# Patient Record
Sex: Male | Born: 1980 | Race: White | Hispanic: No | Smoking: Current every day smoker
Health system: Southern US, Community
[De-identification: ages and names within clinical notes are randomized; demographics above are authoritative.]

## PROBLEM LIST (undated history)

## (undated) DIAGNOSIS — J45909 Unspecified asthma, uncomplicated: Secondary | ICD-10-CM

## (undated) DIAGNOSIS — F419 Anxiety disorder, unspecified: Secondary | ICD-10-CM

## (undated) DIAGNOSIS — F1120 Opioid dependence, uncomplicated: Secondary | ICD-10-CM

## (undated) HISTORY — PX: ANKLE SURGERY: SHX546

---

## 1999-01-05 ENCOUNTER — Emergency Department (HOSPITAL_COMMUNITY): Admission: EM | Admit: 1999-01-05 | Discharge: 1999-01-06 | Payer: Self-pay | Admitting: Endocrinology

## 1999-01-05 ENCOUNTER — Encounter: Payer: Self-pay | Admitting: Endocrinology

## 1999-05-29 ENCOUNTER — Inpatient Hospital Stay (HOSPITAL_COMMUNITY): Admission: EM | Admit: 1999-05-29 | Discharge: 1999-05-30 | Payer: Self-pay | Admitting: Emergency Medicine

## 2001-06-20 ENCOUNTER — Emergency Department (HOSPITAL_COMMUNITY): Admission: EM | Admit: 2001-06-20 | Discharge: 2001-06-20 | Payer: Self-pay | Admitting: Emergency Medicine

## 2001-06-20 ENCOUNTER — Encounter: Payer: Self-pay | Admitting: Emergency Medicine

## 2002-09-21 ENCOUNTER — Emergency Department (HOSPITAL_COMMUNITY): Admission: EM | Admit: 2002-09-21 | Discharge: 2002-09-21 | Payer: Self-pay | Admitting: Emergency Medicine

## 2002-09-21 ENCOUNTER — Encounter: Payer: Self-pay | Admitting: Emergency Medicine

## 2003-08-17 ENCOUNTER — Emergency Department (HOSPITAL_COMMUNITY): Admission: EM | Admit: 2003-08-17 | Discharge: 2003-08-17 | Payer: Self-pay | Admitting: Emergency Medicine

## 2004-02-16 ENCOUNTER — Emergency Department (HOSPITAL_COMMUNITY): Admission: EM | Admit: 2004-02-16 | Discharge: 2004-02-16 | Payer: Self-pay | Admitting: Emergency Medicine

## 2004-02-24 ENCOUNTER — Emergency Department (HOSPITAL_COMMUNITY): Admission: EM | Admit: 2004-02-24 | Discharge: 2004-02-24 | Payer: Self-pay | Admitting: Emergency Medicine

## 2005-03-17 ENCOUNTER — Emergency Department (HOSPITAL_COMMUNITY): Admission: EM | Admit: 2005-03-17 | Discharge: 2005-03-17 | Payer: Self-pay | Admitting: Emergency Medicine

## 2005-07-06 ENCOUNTER — Emergency Department (HOSPITAL_COMMUNITY): Admission: EM | Admit: 2005-07-06 | Discharge: 2005-07-06 | Payer: Self-pay | Admitting: Emergency Medicine

## 2005-10-23 ENCOUNTER — Emergency Department (HOSPITAL_COMMUNITY): Admission: EM | Admit: 2005-10-23 | Discharge: 2005-10-23 | Payer: Self-pay | Admitting: Emergency Medicine

## 2008-10-21 ENCOUNTER — Emergency Department (HOSPITAL_COMMUNITY): Admission: EM | Admit: 2008-10-21 | Discharge: 2008-10-21 | Payer: Self-pay | Admitting: Emergency Medicine

## 2010-12-17 LAB — CBC
HCT: 45 % (ref 39.0–52.0)
Hemoglobin: 15.3 g/dL (ref 13.0–17.0)
MCHC: 34 g/dL (ref 30.0–36.0)
MCV: 95.7 fL (ref 78.0–100.0)
Platelets: 272 10*3/uL (ref 150–400)
RBC: 4.7 MIL/uL (ref 4.22–5.81)
RDW: 12.6 % (ref 11.5–15.5)
WBC: 9 10*3/uL (ref 4.0–10.5)

## 2010-12-17 LAB — POCT I-STAT, CHEM 8
BUN: 15 mg/dL (ref 6–23)
Calcium, Ion: 1.15 mmol/L (ref 1.12–1.32)
Chloride: 102 mEq/L (ref 96–112)
Creatinine, Ser: 1.1 mg/dL (ref 0.4–1.5)
Glucose, Bld: 122 mg/dL — ABNORMAL HIGH (ref 70–99)
HCT: 47 % (ref 39.0–52.0)
Hemoglobin: 16 g/dL (ref 13.0–17.0)
Potassium: 4 mEq/L (ref 3.5–5.1)
Sodium: 140 mEq/L (ref 135–145)
TCO2: 27 mmol/L (ref 0–100)

## 2010-12-17 LAB — DIFFERENTIAL
Basophils Absolute: 0.1 10*3/uL (ref 0.0–0.1)
Basophils Relative: 1 % (ref 0–1)
Eosinophils Absolute: 0.7 10*3/uL (ref 0.0–0.7)
Eosinophils Relative: 8 % — ABNORMAL HIGH (ref 0–5)
Lymphocytes Relative: 29 % (ref 12–46)
Lymphs Abs: 2.6 10*3/uL (ref 0.7–4.0)
Monocytes Absolute: 0.8 10*3/uL (ref 0.1–1.0)
Monocytes Relative: 9 % (ref 3–12)
Neutro Abs: 4.9 10*3/uL (ref 1.7–7.7)
Neutrophils Relative %: 54 % (ref 43–77)

## 2010-12-17 LAB — HEMOCCULT GUIAC POC 1CARD (OFFICE): Fecal Occult Bld: NEGATIVE

## 2011-02-05 DIAGNOSIS — Z720 Tobacco use: Secondary | ICD-10-CM | POA: Diagnosis present

## 2012-03-09 ENCOUNTER — Emergency Department (HOSPITAL_COMMUNITY)
Admission: EM | Admit: 2012-03-09 | Discharge: 2012-03-09 | Disposition: A | Discharge status: Home / Self Care | Payer: Self-pay | Attending: Emergency Medicine | Admitting: Emergency Medicine

## 2012-03-09 ENCOUNTER — Encounter (HOSPITAL_COMMUNITY): Payer: Self-pay | Admitting: Emergency Medicine

## 2012-03-09 DIAGNOSIS — R062 Wheezing: Secondary | ICD-10-CM | POA: Insufficient documentation

## 2012-03-09 DIAGNOSIS — J9801 Acute bronchospasm: Secondary | ICD-10-CM | POA: Insufficient documentation

## 2012-03-09 DIAGNOSIS — F411 Generalized anxiety disorder: Secondary | ICD-10-CM | POA: Insufficient documentation

## 2012-03-09 DIAGNOSIS — R0602 Shortness of breath: Secondary | ICD-10-CM | POA: Insufficient documentation

## 2012-03-09 HISTORY — DX: Anxiety disorder, unspecified: F41.9

## 2012-03-09 HISTORY — DX: Unspecified asthma, uncomplicated: J45.909

## 2012-03-09 MED ORDER — LORAZEPAM 1 MG PO TABS
1.0000 mg | ORAL_TABLET | Freq: Once | ORAL | Status: AC
  Administered 2012-03-09: 1 mg via ORAL
  Filled 2012-03-09: qty 1

## 2012-03-09 MED ORDER — IPRATROPIUM BROMIDE 0.02 % IN SOLN
0.5000 mg | RESPIRATORY_TRACT | Status: AC
  Administered 2012-03-09: 0.5 mg via RESPIRATORY_TRACT
  Filled 2012-03-09: qty 2.5

## 2012-03-09 MED ORDER — ALBUTEROL SULFATE HFA 108 (90 BASE) MCG/ACT IN AERS
2.0000 | INHALATION_SPRAY | Freq: Four times a day (QID) | RESPIRATORY_TRACT | Status: DC
  Administered 2012-03-09: 2 via RESPIRATORY_TRACT
  Filled 2012-03-09: qty 6.7

## 2012-03-09 MED ORDER — ALBUTEROL SULFATE (5 MG/ML) 0.5% IN NEBU
5.0000 mg | INHALATION_SOLUTION | Freq: Once | RESPIRATORY_TRACT | Status: AC
  Administered 2012-03-09: 5 mg via RESPIRATORY_TRACT
  Filled 2012-03-09: qty 40

## 2012-03-09 NOTE — ED Provider Notes (Signed)
Medical screening examination/treatment/procedure(s) were performed by non-physician practitioner and as supervising physician I was immediately available for consultation/collaboration.  Olivia Mackie, MD 03/09/12 [phone]

## 2012-03-09 NOTE — ED Notes (Addendum)
PT presents tearful; reports he feels like he cannot breath, scared, heavy smoker. Lung sounds clear, says fingers are tingly.

## 2012-03-09 NOTE — ED Provider Notes (Signed)
History     CSN: [id number]  Arrival date & time 03/09/12  0610   First MD Initiated Contact with Patient 03/09/12 [phone]      Chief Complaint  Patient presents with  . Shortness of Breath    (Consider location/radiation/quality/duration/timing/severity/associated sxs/prior treatment) HPI Comments: Patient presents with shortness of breath which was sudden onset and awoke him from sleep. It was severe in nature. He had accompanying chest tightness but denies overt chest pain. Also has some tingling to his fingertips. Denies palpitations, diaphoresis, nausea/vomiting. 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 He is a smoker. States this episode today he does feel somewhat similar to his asthma attacks as a child. He also has a history of panic attacks and felt as if he began to panic some secondary to the shortness of breath. He denies any leg swelling, cough, hemoptysis, or recent travel, hospitalizations, or prolonged immobilization. He does not currently have albuterol at home to use.  Patient is a 31 y.o. male presenting with shortness of breath. The history is provided by the patient.  Shortness of Breath  The current episode started today. The onset was sudden. The problem occurs continuously. The problem has been gradually improving. The problem is severe. Nothing relieves the symptoms. Associated symptoms include shortness of breath and wheezing. Pertinent negatives include no chest pain, no chest pressure, no orthopnea, no fever, no rhinorrhea, no sore throat, no stridor and no cough. He has had no prior steroid use. His past medical history is significant for asthma. There were no sick contacts. He has received no recent medical care.    Past Medical History  Diagnosis Date  . Asthma   . Anxiety     No past surgical history on file.  No family history on file.  History  Substance Use Topics  . Smoking status:  Not on file  . Smokeless tobacco: Not on file  . Alcohol Use:       Review of Systems  Constitutional: Negative for fever, chills and diaphoresis.  HENT: Negative for sore throat and rhinorrhea.   Respiratory: Positive for shortness of breath and wheezing. Negative for cough and stridor.   Cardiovascular: Negative for chest pain, palpitations, orthopnea and leg swelling.  Gastrointestinal: Negative for nausea, vomiting and abdominal pain.  Neurological: Negative for dizziness.  Psychiatric/Behavioral: The patient is nervous/anxious.   All other systems reviewed and are negative.    Allergies  Review of patient's allergies indicates no known allergies.  Home Medications   Current Outpatient Rx  Name Route Sig Dispense Refill  . TRAMADOL HCL 50 MG PO TABS Oral Take 50 mg by mouth every 6 (six) hours as needed. For pain      BP 137/77  Pulse 82  Temp 98 F (36.7 C) (Oral)  Resp 18  SpO2 99%  Physical Exam  Nursing note and vitals reviewed. Constitutional: He is oriented to person, place, and time. He appears well-developed and well-nourished. No distress.       Anxious appearing, tearful  HENT:  Head: Normocephalic and atraumatic.  Eyes:       Normal appearance  Neck: Normal range of motion.  Cardiovascular: Normal rate, regular rhythm and normal heart sounds.   Pulmonary/Chest: Effort normal.       Mild wheezing throughout  Abdominal: Soft. Bowel sounds are normal. There is no tenderness. There is no rebound and no guarding.  Musculoskeletal: Normal range  of motion.  Neurological: He is alert and oriented to person, place, and time.  Skin: Skin is warm and dry. He is not diaphoretic.  Psychiatric: He has a normal mood and affect.    ED Course  Procedures (including critical care time)  Labs Reviewed - No data to display No results found.   1. Bronchospasm       MDM  Pt with acute dyspnea this evening. States feels somewhat similar to asthma as a  child. He has slight wheezing on exam. Suspect this represents likely bronchospasm with superimposed anxiety. Satting 100% on RA throughout his stay. Given neb tx and low dose ativan and had resolution of his sx. Pt given albuterol inhaler to use at home should this recur. Reasons to return to ED discussed. Pt agreeable.        Grant Fontana, [place] 03/09/12 [phone]

## 2012-03-09 NOTE — ED Notes (Addendum)
Pt stated he woke up out of his sleep "...feeling like I was gonna die.  I couldn't catch my breath.  It felt like I was having an asthma attack."  Pt reports his chest felt tight and face and hands were tingling.    Pt reports having "panic attacks" in the past - and this felt somewhat similar.    Placed pt in position of comfort - which for him was sitting with HOB elevated.  Dimmed lights and encouraged pt to take slow deep breaths.  Pt states the sob has diminished since arrival to the ED.  Pt maintains 100% oxygen saturation on RA.  Family is at the bedside - will continue to monitor.

## 2012-04-20 ENCOUNTER — Other Ambulatory Visit (HOSPITAL_COMMUNITY): Payer: Self-pay | Admitting: Orthopedic Surgery

## 2012-04-20 DIAGNOSIS — M25511 Pain in right shoulder: Secondary | ICD-10-CM

## 2012-04-22 ENCOUNTER — Inpatient Hospital Stay (HOSPITAL_COMMUNITY)
Admission: RE | Admit: 2012-04-22 | Discharge: 2012-04-22 | Payer: Self-pay | Source: Ambulatory Visit | Attending: Orthopedic Surgery | Admitting: Orthopedic Surgery

## 2012-04-22 ENCOUNTER — Other Ambulatory Visit (HOSPITAL_COMMUNITY): Payer: Self-pay

## 2013-09-01 HISTORY — PX: WRIST SURGERY: SHX841

## 2014-07-31 ENCOUNTER — Emergency Department (HOSPITAL_BASED_OUTPATIENT_CLINIC_OR_DEPARTMENT_OTHER)
Admission: EM | Admit: 2014-07-31 | Discharge: 2014-07-31 | Disposition: A | Discharge status: Home / Self Care | Payer: PRIVATE HEALTH INSURANCE | Attending: Emergency Medicine | Admitting: Emergency Medicine

## 2014-07-31 ENCOUNTER — Encounter (HOSPITAL_BASED_OUTPATIENT_CLINIC_OR_DEPARTMENT_OTHER): Payer: Self-pay | Admitting: *Deleted

## 2014-07-31 DIAGNOSIS — Z8659 Personal history of other mental and behavioral disorders: Secondary | ICD-10-CM | POA: Insufficient documentation

## 2014-07-31 DIAGNOSIS — J45909 Unspecified asthma, uncomplicated: Secondary | ICD-10-CM | POA: Insufficient documentation

## 2014-07-31 DIAGNOSIS — K5289 Other specified noninfective gastroenteritis and colitis: Secondary | ICD-10-CM | POA: Insufficient documentation

## 2014-07-31 DIAGNOSIS — K529 Noninfective gastroenteritis and colitis, unspecified: Secondary | ICD-10-CM

## 2014-07-31 MED ORDER — ONDANSETRON 4 MG PO TBDP
ORAL_TABLET | ORAL | Status: AC
  Filled 2014-07-31: qty 1

## 2014-07-31 MED ORDER — ONDANSETRON 4 MG PO TBDP
4.0000 mg | ORAL_TABLET | Freq: Once | ORAL | Status: AC
  Administered 2014-07-31: 4 mg via ORAL

## 2014-07-31 MED ORDER — ONDANSETRON 4 MG PO TBDP: ORAL_TABLET | ORAL | Status: DC

## 2014-07-31 NOTE — ED Notes (Signed)
Pt reports N/V/D x 1 day.

## 2014-07-31 NOTE — ED Provider Notes (Signed)
CSN: [id number]     Arrival date & time 07/31/14  1551 History  This chart was scribed for Kenneth MoMatthew Gentry, MD by Gwenyth Oberatherine Macek, ED Scribe. This patient was seen in room MH01/MH01 and the patient's care was started at 5:07 PM.    Chief Complaint  Patient presents with  . Emesis   The history is provided by the patient. No language interpreter was used.    HPI Comments: Kenneth Charles is a 33 y.o. male who presents to the Emergency Department complaining of moderate nausea, vomiting and diarrhea that started 1 day ago. Pt has history of asthma, but no other medication conditions. He denies fever and abdominal pain as associated symptoms.  Timing is intermittent.  Nothing exacerbates or alleviates his symptoms. He has no pertinent past medical history.  He is tolerant of by mouth intake, states he attempted to make an appointment with his PCP but was unable to ventilator in the week..  Past Medical History  Diagnosis Date  . Asthma   . Anxiety    History reviewed. No pertinent past surgical history. History reviewed. No pertinent family history. History  Substance Use Topics  . Smoking status: Not on file  . Smokeless tobacco: Not on file  . Alcohol Use: Not on file    Review of Systems  Constitutional: Negative for fever.  Gastrointestinal: Positive for nausea, vomiting and diarrhea. Negative for abdominal pain.  All other systems reviewed and are negative.  Allergies  Review of patient's allergies indicates no known allergies.  Home Medications   Prior to Admission medications   Medication Sig Start Date End Date Taking? Authorizing Provider  ondansetron (ZOFRAN ODT) 4 MG disintegrating tablet 4mg  ODT q4 hours prn nausea/vomit 07/31/14   Kenneth MoMatthew Gentry, MD   BP 127/79 mmHg  Pulse 80  Temp(Src) 98.6 F (37 C) (Oral)  Resp 18  Ht 6\' 2"  (1.88 m)  Wt 175 lb (79.379 kg)  BMI 22.46 kg/m2  SpO2 99% Physical Exam  Constitutional: He is oriented to person, place, and  time. He appears well-developed and well-nourished.  HENT:  Head: Normocephalic and atraumatic.  Eyes: Conjunctivae and EOM are normal.  Neck: Normal range of motion. Neck supple.  Cardiovascular: Normal rate, regular rhythm and normal heart sounds.   Pulmonary/Chest: Effort normal and breath sounds normal. No respiratory distress.  Abdominal: He exhibits no distension. There is no tenderness. There is no rebound and no guarding.  Musculoskeletal: Normal range of motion.  Neurological: He is alert and oriented to person, place, and time.  Skin: Skin is warm and dry.  Vitals reviewed.   ED Course  Procedures (including critical care time) DIAGNOSTIC STUDIES: Oxygen Saturation is 99% on RA, normal by my interpretation.    COORDINATION OF CARE: 5:08 PM Discussed treatment plan with pt which includes Zofran. Pt agreed to plan.  Labs Review Labs Reviewed - No data to display  Imaging Review No results found.   EKG Interpretation None      MDM   Final diagnoses:  Gastroenteritis    33 y.o. male with pertinent PMH of asthma presents with signs and symptoms consistent with viral gastroenteritis 2 days. No fevers. Patient is tired appearing take. On arrival vital signs and physical exam as above, completely benign. Likely viral gastroenteritis. Discussed possibility of other occult pathology. Patient given Zofran. Discharged in stable condition with standard return precautions..    1. Gastroenteritis           Kenneth MoMatthew Gentry, MD 07/31/14  1715 

## 2014-07-31 NOTE — Discharge Instructions (Signed)

## 2014-08-04 ENCOUNTER — Encounter (HOSPITAL_BASED_OUTPATIENT_CLINIC_OR_DEPARTMENT_OTHER): Payer: Self-pay

## 2014-08-04 ENCOUNTER — Emergency Department (HOSPITAL_BASED_OUTPATIENT_CLINIC_OR_DEPARTMENT_OTHER)
Admission: EM | Admit: 2014-08-04 | Discharge: 2014-08-04 | Disposition: A | Discharge status: Home / Self Care | Payer: PRIVATE HEALTH INSURANCE | Attending: Emergency Medicine | Admitting: Emergency Medicine

## 2014-08-04 DIAGNOSIS — J45909 Unspecified asthma, uncomplicated: Secondary | ICD-10-CM | POA: Insufficient documentation

## 2014-08-04 DIAGNOSIS — K529 Noninfective gastroenteritis and colitis, unspecified: Secondary | ICD-10-CM | POA: Insufficient documentation

## 2014-08-04 DIAGNOSIS — Z720 Tobacco use: Secondary | ICD-10-CM | POA: Insufficient documentation

## 2014-08-04 DIAGNOSIS — R197 Diarrhea, unspecified: Secondary | ICD-10-CM

## 2014-08-04 DIAGNOSIS — Z8659 Personal history of other mental and behavioral disorders: Secondary | ICD-10-CM | POA: Insufficient documentation

## 2014-08-04 LAB — URINALYSIS, ROUTINE W REFLEX MICROSCOPIC
Glucose, UA: NEGATIVE mg/dL
Hgb urine dipstick: NEGATIVE
Ketones, ur: 15 mg/dL — AB
Leukocytes, UA: NEGATIVE
Nitrite: NEGATIVE
Protein, ur: NEGATIVE mg/dL
Specific Gravity, Urine: 1.038 — ABNORMAL HIGH (ref 1.005–1.030)
Urobilinogen, UA: 1 mg/dL (ref 0.0–1.0)
pH: 6 (ref 5.0–8.0)

## 2014-08-04 LAB — COMPREHENSIVE METABOLIC PANEL
ALBUMIN: 3.8 g/dL (ref 3.5–5.2)
ALT: 30 U/L (ref 0–53)
ANION GAP: 12 (ref 5–15)
AST: 23 U/L (ref 0–37)
Alkaline Phosphatase: 90 U/L (ref 39–117)
BILIRUBIN TOTAL: 0.4 mg/dL (ref 0.3–1.2)
BUN: 14 mg/dL (ref 6–23)
CHLORIDE: 102 meq/L (ref 96–112)
CO2: 26 mEq/L (ref 19–32)
CREATININE: 0.9 mg/dL (ref 0.50–1.35)
Calcium: 9.3 mg/dL (ref 8.4–10.5)
GFR calc Af Amer: >90 mL/min (ref >90)
GFR calc non Af Amer: >90 mL/min (ref >90)
Glucose, Bld: 93 mg/dL (ref 70–99)
Potassium: 3.6 mEq/L — ABNORMAL LOW (ref 3.7–5.3)
Sodium: 140 mEq/L (ref 137–147)
TOTAL PROTEIN: 7.5 g/dL (ref 6.0–8.3)

## 2014-08-04 LAB — CBC WITH DIFFERENTIAL/PLATELET
Basophils Absolute: 0.2 10*3/uL — ABNORMAL HIGH (ref 0.0–0.1)
Basophils Relative: 2 % — ABNORMAL HIGH (ref 0–1)
EOS ABS: 0.4 10*3/uL (ref 0.0–0.7)
EOS PCT: 4 % (ref 0–5)
HCT: 42 % (ref 39.0–52.0)
HEMOGLOBIN: 14 g/dL (ref 13.0–17.0)
Lymphocytes Relative: 22 % (ref 12–46)
Lymphs Abs: 2.2 10*3/uL (ref 0.7–4.0)
MCH: 29.8 pg (ref 26.0–34.0)
MCHC: 33.3 g/dL (ref 30.0–36.0)
MCV: 89.4 fL (ref 78.0–100.0)
MONO ABS: 1.2 10*3/uL — AB (ref 0.1–1.0)
Monocytes Relative: 12 % (ref 3–12)
NEUTROS PCT: 60 % (ref 43–77)
Neutro Abs: 6 10*3/uL (ref 1.7–7.7)
PLATELETS: 322 10*3/uL (ref 150–400)
RBC: 4.7 MIL/uL (ref 4.22–5.81)
RDW: 12.5 % (ref 11.5–15.5)
WBC: 10 10*3/uL (ref 4.0–10.5)

## 2014-08-04 LAB — OCCULT BLOOD X 1 CARD TO LAB, STOOL: FECAL OCCULT BLD: NEGATIVE

## 2014-08-04 LAB — LIPASE, BLOOD: LIPASE: 15 U/L (ref 11–59)

## 2014-08-04 MED ORDER — DICYCLOMINE HCL 10 MG PO CAPS
10.0000 mg | ORAL_CAPSULE | Freq: Once | ORAL | Status: AC
  Administered 2014-08-04: 10 mg via ORAL

## 2014-08-04 MED ORDER — DIPHENOXYLATE-ATROPINE 2.5-0.025 MG PO TABS: 2.0000 | ORAL_TABLET | Freq: Four times a day (QID) | ORAL | Status: DC | PRN

## 2014-08-04 MED ORDER — SODIUM CHLORIDE 0.9 % IV BOLUS (SEPSIS)
1000.0000 mL | Freq: Once | INTRAVENOUS | Status: AC
  Administered 2014-08-04: 1000 mL via INTRAVENOUS

## 2014-08-04 MED ORDER — DICYCLOMINE HCL 20 MG PO TABS: 20.0000 mg | ORAL_TABLET | Freq: Two times a day (BID) | ORAL | Status: DC

## 2014-08-04 MED ORDER — DIPHENOXYLATE-ATROPINE 2.5-0.025 MG PO TABS
2.0000 | ORAL_TABLET | Freq: Once | ORAL | Status: AC
  Administered 2014-08-04: 2 via ORAL

## 2014-08-04 NOTE — ED Provider Notes (Signed)
CSN: [id number]     Arrival date & time 08/04/14  1436 History   First MD Initiated Contact with Patient 08/04/14 1555     Chief Complaint  Patient presents with  . Abdominal Pain     (Consider location/radiation/quality/duration/timing/severity/associated sxs/prior Treatment) HPI Comments: Patient presents with 8 days of profuse watery diarrhea, generalized abdominal pain, nausea, vomiting. Patient began having symptoms and was seen in the emergency department 1-2 days after onset was diagnosed with gastroenteritis. Patient attempted the Zofran and Imodium at home without any relief. Patient states that he vomits and has watery diarrhea after eating or drinking anything. Otherwise, patient has diarrhea every 45 minutes to 1 hour. No recent travel. No recent camping or ingestion of water outdoors. No heavy NSAID use. No recent antibiotic use. Patient denies alcohol use. He has noted some streaks of red blood in his stool. Vomiting is nonbloody, nonbilious. Patient had a previous normal colonoscopy several years ago after having blood in stool.  Patient is a 33 y.o. male presenting with abdominal pain. The history is provided by the patient.  Abdominal Pain Associated symptoms: diarrhea, nausea and vomiting   Associated symptoms: no chest pain, no constipation, no cough, no dysuria, no fever, no shortness of breath and no sore throat     Past Medical History  Diagnosis Date  . Asthma   . Anxiety    History reviewed. No pertinent past surgical history. No family history on file. History  Substance Use Topics  . Smoking status: Current Every Day Smoker -- 0.50 packs/day    Types: Cigarettes  . Smokeless tobacco: Not on file  . Alcohol Use: No    Review of Systems  Constitutional: Negative for fever.  HENT: Negative for rhinorrhea and sore throat.   Eyes: Negative for redness.  Respiratory: Negative for cough and shortness of breath.   Cardiovascular: Negative for chest pain.   Gastrointestinal: Positive for nausea, vomiting, abdominal pain, diarrhea and blood in stool. Negative for constipation.  Genitourinary: Negative for dysuria.  Musculoskeletal: Negative for myalgias.  Skin: Negative for rash.  Neurological: Negative for headaches.    Allergies  Review of patient's allergies indicates no known allergies.  Home Medications   Prior to Admission medications   Medication Sig Start Date End Date Taking? Authorizing Provider  ondansetron (ZOFRAN ODT) 4 MG disintegrating tablet 4mg  ODT q4 hours prn nausea/vomit 07/31/14   Mirian MoMatthew Gentry, MD   BP 139/87 mmHg  Pulse 96  Temp(Src) 99 F (37.2 C) (Oral)  Resp 18  Ht 6\' 2"  (1.88 m)  Wt 175 lb (79.379 kg)  BMI 22.46 kg/m2  SpO2 96%   Physical Exam  Constitutional: He appears well-developed and well-nourished.  HENT:  Head: Normocephalic and atraumatic.  Mouth/Throat: Oropharynx is clear and moist.  Eyes: Conjunctivae are normal. Right eye exhibits no discharge. Left eye exhibits no discharge.  Neck: Normal range of motion. Neck supple. No JVD present.  Cardiovascular: Normal rate, regular rhythm and normal heart sounds.   No murmur heard. Pulmonary/Chest: Effort normal and breath sounds normal. No respiratory distress. He has no wheezes. He has no rales.  Abdominal: Soft. Bowel sounds are increased. There is tenderness (mild, generalized). There is no rebound and no guarding.  Musculoskeletal: He exhibits no edema or tenderness.  Neurological: He is alert.  Skin: Skin is warm and dry.  Psychiatric: He has a normal mood and affect.  Nursing note and vitals reviewed.   ED Course  Procedures (including critical care time) Labs  Review Labs Reviewed  URINALYSIS, ROUTINE W REFLEX MICROSCOPIC - Abnormal; Notable for the following:    Color, Urine AMBER (*)    APPearance CLOUDY (*)    Specific Gravity, Urine 1.038 (*)    Bilirubin Urine SMALL (*)    Ketones, ur 15 (*)    All other components within  normal limits  CBC WITH DIFFERENTIAL - Abnormal; Notable for the following:    Basophils Relative 2 (*)    Monocytes Absolute 1.2 (*)    Basophils Absolute 0.2 (*)    All other components within normal limits  COMPREHENSIVE METABOLIC PANEL - Abnormal; Notable for the following:    Potassium 3.6 (*)    All other components within normal limits  OCCULT BLOOD X 1 CARD TO LAB, STOOL  LIPASE, BLOOD  CBC WITH DIFFERENTIAL  GI PATHOGEN PANEL BY PCR, STOOL    Imaging Review No results found.   EKG Interpretation None      4:37 PM Patient seen and examined. Work-up initiated. Medications ordered.   Vital signs reviewed and are as follows: BP 139/87 mmHg  Pulse 96  Temp(Src) 99 F (37.2 C) (Oral)  Resp 18  Ht 6\' 2"  (1.88 m)  Wt 175 lb (79.379 kg)  BMI 22.46 kg/m2  SpO2 96%  7:14 PM Patient informed of results. Discussed with Dr. Deretha EmoryZackowski. Labs are reassuring. Patient has provided a stool sample and a GI pathogen panel has been sent.  Patient is feeling somewhat better after fluids. He continues to have diarrhea in emergency department. He has been treated with 2 L of normal saline. No vomiting.  We discussed risks and benefits of empiric antibiotics. Patient elects to forego antibiotics until we know the results of the GI pathogen panel. In the meantime, will treat with Lomotil and Bentyl.  Patient told to return to the emergency department with uncontrolled vomiting, if he develops fever, worsening or localizing abdominal pain, increasing amount of blood in stool. Patient verbalizes understanding and agrees with plan.    MDM   Final diagnoses:  Diarrhea  Colitis   Patients with profuse diarrhea for 8 days, bloody at times. Nonlocalized generalized abdominal pain. No fever. Patient has an element of dehydration in emergency department treated with IV fluids. Labs are overall reassuring. GI pathogen panel obtained and is currently pending. Will attempt to control symptoms  with Lomotil and Bentyl. Patient opts to defer antibiotics until definitive diagnosis is made. Patient appears well, nontoxic. We discussed return instructions and patient seems reliable to return if worsening. Do not feel that patient would benefit from hospitalization at this time.  No dangerous or life-threatening conditions suspected or identified by history, physical exam, and by work-up. No indications for hospitalization identified.      Renne CriglerJoshua Venson Ferencz, PA-C 08/04/14 2151  Vanetta MuldersScott Zackowski, MD 08/04/14 2330

## 2014-08-04 NOTE — Discharge Instructions (Signed)
Please read and follow all provided instructions.  Your diagnoses today include:  1. Diarrhea   2. Colitis     Tests performed today include:  Blood counts and electrolytes  Blood tests to check liver and kidney function  Blood tests to check pancreas function  Urine test to look for infection - shows dehydration  Stool test for different pathogens - pending  Stool test for blood - negative here tonight  Vital signs. See below for your results today.   Medications prescribed:   Lomotil - medication for diarrhea   Bentyl - medication for intestinal spasms  Take any prescribed medications only as directed.  Home care instructions:   Follow any educational materials contained in this packet.  Follow-up instructions: Please follow-up with your primary care provider in the next 2 days for further evaluation of your symptoms.    Return instructions:  SEEK IMMEDIATE MEDICAL ATTENTION IF:  The pain does not go away or becomes severe   A temperature above 101F develops   Repeated vomiting occurs (multiple episodes)   The pain becomes localized to portions of the abdomen. The right side could possibly be appendicitis. In an adult, the left lower portion of the abdomen could be colitis or diverticulitis.   Blood is being passed in stools or vomit (bright red or black tarry stools)   You develop chest pain, difficulty breathing, dizziness or fainting, or become confused, poorly responsive, or inconsolable (young children)  If you have any other emergent concerns regarding your health  Additional Information: Abdominal (belly) pain can be caused by many things. Your caregiver performed an examination and possibly ordered blood/urine tests and imaging (CT scan, x-rays, ultrasound). Many cases can be observed and treated at home after initial evaluation in the emergency department. Even though you are being discharged home, abdominal pain can be unpredictable. Therefore, you  need a repeated exam if your pain does not resolve, returns, or worsens. Most patients with abdominal pain don't have to be admitted to the hospital or have surgery, but serious problems like appendicitis and gallbladder attacks can start out as nonspecific pain. Many abdominal conditions cannot be diagnosed in one visit, so follow-up evaluations are very important.  Your vital signs today were: BP 139/87 mmHg   Pulse 96   Temp(Src) 99 F (37.2 C) (Oral)   Resp 18   Ht 6\' 2"  (1.88 m)   Wt 175 lb (79.379 kg)   BMI 22.46 kg/m2   SpO2 96% If your blood pressure (bp) was elevated above 135/85 this visit, please have this repeated by your doctor within one month. --------------

## 2014-08-04 NOTE — ED Notes (Signed)
Pt reports seen here recently for same, vomiting diarrhea and abd pain.

## 2014-08-07 LAB — GI PATHOGEN PANEL BY PCR, STOOL
C difficile toxin A/B: NEGATIVE
CAMPYLOBACTER BY PCR: NEGATIVE
CRYPTOSPORIDIUM BY PCR: NEGATIVE
E COLI (ETEC) LT/ST: NEGATIVE
E coli (STEC): NEGATIVE
E coli 0157 by PCR: NEGATIVE
G lamblia by PCR: NEGATIVE
Norovirus GI/GII: NEGATIVE
Rotavirus A by PCR: NEGATIVE
Salmonella by PCR: NEGATIVE
Shigella by PCR: NEGATIVE

## 2014-08-09 ENCOUNTER — Emergency Department (HOSPITAL_BASED_OUTPATIENT_CLINIC_OR_DEPARTMENT_OTHER)
Admission: EM | Admit: 2014-08-09 | Discharge: 2014-08-09 | Disposition: A | Discharge status: Home / Self Care | Payer: PRIVATE HEALTH INSURANCE | Attending: Emergency Medicine | Admitting: Emergency Medicine

## 2014-08-09 ENCOUNTER — Encounter (HOSPITAL_BASED_OUTPATIENT_CLINIC_OR_DEPARTMENT_OTHER): Payer: Self-pay

## 2014-08-09 ENCOUNTER — Emergency Department (HOSPITAL_BASED_OUTPATIENT_CLINIC_OR_DEPARTMENT_OTHER): Payer: PRIVATE HEALTH INSURANCE

## 2014-08-09 DIAGNOSIS — R112 Nausea with vomiting, unspecified: Secondary | ICD-10-CM | POA: Insufficient documentation

## 2014-08-09 DIAGNOSIS — Z8659 Personal history of other mental and behavioral disorders: Secondary | ICD-10-CM | POA: Insufficient documentation

## 2014-08-09 DIAGNOSIS — J45909 Unspecified asthma, uncomplicated: Secondary | ICD-10-CM | POA: Insufficient documentation

## 2014-08-09 DIAGNOSIS — R197 Diarrhea, unspecified: Secondary | ICD-10-CM | POA: Insufficient documentation

## 2014-08-09 DIAGNOSIS — R109 Unspecified abdominal pain: Secondary | ICD-10-CM | POA: Insufficient documentation

## 2014-08-09 DIAGNOSIS — Z79899 Other long term (current) drug therapy: Secondary | ICD-10-CM | POA: Insufficient documentation

## 2014-08-09 DIAGNOSIS — Z720 Tobacco use: Secondary | ICD-10-CM | POA: Insufficient documentation

## 2014-08-09 LAB — URINALYSIS, ROUTINE W REFLEX MICROSCOPIC
BILIRUBIN URINE: NEGATIVE
Glucose, UA: NEGATIVE mg/dL
Hgb urine dipstick: NEGATIVE
KETONES UR: NEGATIVE mg/dL
Leukocytes, UA: NEGATIVE
Nitrite: NEGATIVE
PH: 5.5 (ref 5.0–8.0)
Protein, ur: NEGATIVE mg/dL
Specific Gravity, Urine: >1.046 — ABNORMAL HIGH (ref 1.005–1.030)
UROBILINOGEN UA: 0.2 mg/dL (ref 0.0–1.0)

## 2014-08-09 LAB — COMPREHENSIVE METABOLIC PANEL
ALK PHOS: 81 U/L (ref 39–117)
ALT: 27 U/L (ref 0–53)
ANION GAP: 12 (ref 5–15)
AST: 16 U/L (ref 0–37)
Albumin: 3.7 g/dL (ref 3.5–5.2)
BUN: 12 mg/dL (ref 6–23)
CHLORIDE: 104 meq/L (ref 96–112)
CO2: 28 meq/L (ref 19–32)
CREATININE: 0.9 mg/dL (ref 0.50–1.35)
Calcium: 9.3 mg/dL (ref 8.4–10.5)
GFR calc Af Amer: >90 mL/min (ref >90)
Glucose, Bld: 121 mg/dL — ABNORMAL HIGH (ref 70–99)
POTASSIUM: 3.6 meq/L — AB (ref 3.7–5.3)
Sodium: 144 mEq/L (ref 137–147)
Total Protein: 7.1 g/dL (ref 6.0–8.3)

## 2014-08-09 LAB — CBC WITH DIFFERENTIAL/PLATELET
BASOS PCT: 1 % (ref 0–1)
Basophils Absolute: 0.1 10*3/uL (ref 0.0–0.1)
Eosinophils Absolute: 0.7 10*3/uL (ref 0.0–0.7)
Eosinophils Relative: 7 % — ABNORMAL HIGH (ref 0–5)
HEMATOCRIT: 41.4 % (ref 39.0–52.0)
Hemoglobin: 13.9 g/dL (ref 13.0–17.0)
LYMPHS ABS: 2.9 10*3/uL (ref 0.7–4.0)
LYMPHS PCT: 29 % (ref 12–46)
MCH: 30.4 pg (ref 26.0–34.0)
MCHC: 33.6 g/dL (ref 30.0–36.0)
MCV: 90.6 fL (ref 78.0–100.0)
MONO ABS: 0.7 10*3/uL (ref 0.1–1.0)
MONOS PCT: 7 % (ref 3–12)
NEUTROS PCT: 56 % (ref 43–77)
Neutro Abs: 5.5 10*3/uL (ref 1.7–7.7)
Platelets: 327 10*3/uL (ref 150–400)
RBC: 4.57 MIL/uL (ref 4.22–5.81)
RDW: 12.8 % (ref 11.5–15.5)
WBC: 9.7 10*3/uL (ref 4.0–10.5)

## 2014-08-09 LAB — LIPASE, BLOOD: Lipase: 17 U/L (ref 11–59)

## 2014-08-09 LAB — I-STAT CG4 LACTIC ACID, ED: Lactic Acid, Venous: 1.49 mmol/L (ref 0.5–2.2)

## 2014-08-09 MED ORDER — LOPERAMIDE HCL 2 MG PO CAPS: 2.0000 mg | ORAL_CAPSULE | Freq: Four times a day (QID) | ORAL | Status: DC | PRN

## 2014-08-09 MED ORDER — DEXTROSE 5 % IV BOLUS
1000.0000 mL | Freq: Once | INTRAVENOUS | Status: AC
  Administered 2014-08-09: 1000 mL via INTRAVENOUS

## 2014-08-09 MED ORDER — ONDANSETRON HCL 4 MG/2ML IJ SOLN
4.0000 mg | Freq: Once | INTRAMUSCULAR | Status: AC
  Administered 2014-08-09: 4 mg via INTRAVENOUS
  Filled 2014-08-09: qty 2

## 2014-08-09 MED ORDER — IOHEXOL 300 MG/ML  SOLN
25.0000 mL | Freq: Once | INTRAMUSCULAR | Status: AC | PRN
  Administered 2014-08-09: 25 mL via ORAL

## 2014-08-09 MED ORDER — IOHEXOL 300 MG/ML  SOLN
100.0000 mL | Freq: Once | INTRAMUSCULAR | Status: AC | PRN
  Administered 2014-08-09: 100 mL via INTRAVENOUS

## 2014-08-09 MED ORDER — SODIUM CHLORIDE 0.9 % IV BOLUS (SEPSIS)
1000.0000 mL | Freq: Once | INTRAVENOUS | Status: AC
  Administered 2014-08-09: 1000 mL via INTRAVENOUS

## 2014-08-09 NOTE — ED Provider Notes (Signed)
CT neg for acute pathology.  Labs unremarkable except for high specific gravity of urine and dehydration.  Pt given second liter of fluid and d/ced home with immodium and f/u with PCP and GI if sx persist.  Gwyneth SproutWhitney Trammell Bowden, MD 08/09/14 1801

## 2014-08-09 NOTE — ED Notes (Signed)
Per Kenneth RossM Simms [facility] and blood draw

## 2014-08-09 NOTE — ED Provider Notes (Signed)
CSN: [id number]     Arrival date & time 08/09/14  1432 History   First MD Initiated Contact with Patient 08/09/14 1446     Chief Complaint  Patient presents with  . Abdominal Pain     (Consider location/radiation/quality/duration/timing/severity/associated sxs/prior Treatment) HPI Patient is a 33 yo man w/ 11 day history of nausea/vomiting, diarrhea and cramping lower abdominal pain. He reports that 2 days after thanksgiving he ate some leftovers and immediately had abdominal pain and vomiting. After a few days of n/v/d he presented to the ED. He returned a few days later for the same thing. Today is his 3rd visit. A GI pathogen panel obtained at the last visit was negative. He reports that he felt better for a few days after the IVF he received during his last visit but he continued to have severe diarrhea and soon felt weak and dehydrated yet again. He reports he is still unable to keep anything down and vomits and has profuse watery diarrhea after eating or drinking anything. He reports that his abdominal pain is worse than at previous visits. He denies fevers, chills. He does endorse some blood in his stool for the past few days.  Past Medical History  Diagnosis Date  . Asthma   . Anxiety    History reviewed. No pertinent past surgical history. No family history on file. History  Substance Use Topics  . Smoking status: Current Every Day Smoker -- 0.50 packs/day    Types: Cigarettes  . Smokeless tobacco: Not on file  . Alcohol Use: No    Review of Systems See HPI   Allergies  Review of patient's allergies indicates no known allergies.  Home Medications   Prior to Admission medications   Medication Sig Start Date End Date Taking? Authorizing Provider  dicyclomine (BENTYL) 20 MG tablet Take 1 tablet (20 mg total) by mouth 2 (two) times daily. 08/04/14   Renne CriglerJoshua Geiple, PA-C  diphenoxylate-atropine (LOMOTIL) 2.5-0.025 MG per tablet Take 2 tablets by mouth 4 (four) times daily as  needed for diarrhea or loose stools. 08/04/14   Renne CriglerJoshua Geiple, PA-C  ondansetron (ZOFRAN ODT) 4 MG disintegrating tablet 4mg  ODT q4 hours prn nausea/vomit 07/31/14   Mirian MoMatthew Gentry, MD   BP 124/84 mmHg  Pulse 80  Temp(Src) 98.4 F (36.9 C) (Oral)  Resp 18  Ht 6\' 2"  (1.88 m)  Wt 171 lb (77.565 kg)  BMI 21.95 kg/m2  SpO2 96% Physical Exam  Constitutional: He is oriented to person, place, and time. He appears well-developed and well-nourished. No distress.  Dry and ill-appearing  HENT:  Mouth/Throat: Mucous membranes are dry.  Eyes: Conjunctivae are normal. Right eye exhibits no discharge. Left eye exhibits no discharge. No scleral icterus.  Cardiovascular: Normal rate, regular rhythm, normal heart sounds and intact distal pulses.  Exam reveals no gallop and no friction rub.   No murmur heard. Pulmonary/Chest: Effort normal and breath sounds normal. No respiratory distress. He has no wheezes.  Abdominal: Soft. Bowel sounds are normal. He exhibits no distension and no mass. There is tenderness. There is no rebound and no guarding.  Lower abdominal pain and tenderness  Neurological: He is alert and oriented to person, place, and time.  Skin: Skin is warm and dry. No rash noted. He is not diaphoretic. No erythema.  Psychiatric: He has a normal mood and affect. His behavior is normal.  Nursing note and vitals reviewed.   ED Course  Procedures (including critical care time) Labs Review Labs Reviewed -  No data to display  Imaging Review No results found.   EKG Interpretation None      MDM   Final diagnoses:  None   Abdominal pain with d/n/v now at 11 days duration. Likely colitis but GI pathogen panel negative at last visit. Will obtain CT abd and CBC, CMP. Give 1L NS for dehydration on exam.   Abram SanderElena M Avamarie Crossley, MD 08/10/14 [id number]  Vida RollerBrian D Miller, MD 08/10/14 [phone]

## 2014-08-09 NOTE — ED Notes (Signed)
C/o cont'd abd pain, n/v/d-3rd ED visit since 11/30 for same

## 2014-10-05 HISTORY — PX: ORIF CALCANEAL FRACTURE: SUR921

## 2014-10-06 ENCOUNTER — Emergency Department (HOSPITAL_BASED_OUTPATIENT_CLINIC_OR_DEPARTMENT_OTHER)
Admission: EM | Admit: 2014-10-06 | Discharge: 2014-10-06 | Disposition: A | Discharge status: Other Acute Inpatient Hospital | Payer: PRIVATE HEALTH INSURANCE | Attending: Emergency Medicine | Admitting: Emergency Medicine

## 2014-10-06 ENCOUNTER — Encounter (HOSPITAL_BASED_OUTPATIENT_CLINIC_OR_DEPARTMENT_OTHER): Payer: Self-pay | Admitting: Emergency Medicine

## 2014-10-06 DIAGNOSIS — F1120 Opioid dependence, uncomplicated: Secondary | ICD-10-CM

## 2014-10-06 DIAGNOSIS — Z79899 Other long term (current) drug therapy: Secondary | ICD-10-CM | POA: Insufficient documentation

## 2014-10-06 DIAGNOSIS — M79672 Pain in left foot: Secondary | ICD-10-CM

## 2014-10-06 DIAGNOSIS — J45909 Unspecified asthma, uncomplicated: Secondary | ICD-10-CM | POA: Insufficient documentation

## 2014-10-06 DIAGNOSIS — L0390 Cellulitis, unspecified: Secondary | ICD-10-CM

## 2014-10-06 DIAGNOSIS — L0291 Cutaneous abscess, unspecified: Secondary | ICD-10-CM

## 2014-10-06 DIAGNOSIS — F1920 Other psychoactive substance dependence, uncomplicated: Secondary | ICD-10-CM

## 2014-10-06 DIAGNOSIS — Z8659 Personal history of other mental and behavioral disorders: Secondary | ICD-10-CM | POA: Insufficient documentation

## 2014-10-06 DIAGNOSIS — L02413 Cutaneous abscess of right upper limb: Secondary | ICD-10-CM | POA: Insufficient documentation

## 2014-10-06 DIAGNOSIS — S92009A Unspecified fracture of unspecified calcaneus, initial encounter for closed fracture: Secondary | ICD-10-CM | POA: Diagnosis present

## 2014-10-06 DIAGNOSIS — G8918 Other acute postprocedural pain: Secondary | ICD-10-CM | POA: Insufficient documentation

## 2014-10-06 DIAGNOSIS — Z720 Tobacco use: Secondary | ICD-10-CM | POA: Insufficient documentation

## 2014-10-06 HISTORY — DX: Opioid dependence, uncomplicated: F11.20

## 2014-10-06 LAB — CBC WITH DIFFERENTIAL/PLATELET
BASOS ABS: 0 10*3/uL (ref 0.0–0.1)
BASOS PCT: 0 % (ref 0–1)
EOS ABS: 0.3 10*3/uL (ref 0.0–0.7)
EOS PCT: 2 % (ref 0–5)
HEMATOCRIT: 35.9 % — AB (ref 39.0–52.0)
HEMOGLOBIN: 11.7 g/dL — AB (ref 13.0–17.0)
Lymphocytes Relative: 8 % — ABNORMAL LOW (ref 12–46)
Lymphs Abs: 1.3 10*3/uL (ref 0.7–4.0)
MCH: 29.6 pg (ref 26.0–34.0)
MCHC: 32.6 g/dL (ref 30.0–36.0)
MCV: 90.9 fL (ref 78.0–100.0)
Monocytes Absolute: 1.5 10*3/uL — ABNORMAL HIGH (ref 0.1–1.0)
Monocytes Relative: 9 % (ref 3–12)
NEUTROS ABS: 13 10*3/uL — AB (ref 1.7–7.7)
NEUTROS PCT: 81 % — AB (ref 43–77)
PLATELETS: 373 10*3/uL (ref 150–400)
RBC: 3.95 MIL/uL — AB (ref 4.22–5.81)
RDW: 13.1 % (ref 11.5–15.5)
WBC: 16.1 10*3/uL — AB (ref 4.0–10.5)

## 2014-10-06 LAB — BASIC METABOLIC PANEL
ANION GAP: 7 (ref 5–15)
BUN: 16 mg/dL (ref 6–23)
CALCIUM: 8.4 mg/dL (ref 8.4–10.5)
CO2: 25 mmol/L (ref 19–32)
CREATININE: 0.94 mg/dL (ref 0.50–1.35)
Chloride: 100 mmol/L (ref 96–112)
GFR calc Af Amer: >90 mL/min (ref >90)
GLUCOSE: 147 mg/dL — AB (ref 70–99)
Potassium: 3.7 mmol/L (ref 3.5–5.1)
SODIUM: 132 mmol/L — AB (ref 135–145)

## 2014-10-06 MED ORDER — LIDOCAINE-EPINEPHRINE 2 %-1:100000 IJ SOLN
30.0000 mL | Freq: Once | INTRAMUSCULAR | Status: AC
  Administered 2014-10-06: 30 mL via INTRADERMAL

## 2014-10-06 MED ORDER — SODIUM CHLORIDE 0.9 % IV SOLN
INTRAVENOUS | Status: DC
  Administered 2014-10-06: 03:00:00 via INTRAVENOUS

## 2014-10-06 MED ORDER — PIPERACILLIN-TAZOBACTAM 4.5 G IVPB
4.5000 g | Freq: Once | INTRAVENOUS | Status: DC
  Filled 2014-10-06: qty 100

## 2014-10-06 MED ORDER — VANCOMYCIN HCL IN DEXTROSE 1-5 GM/200ML-% IV SOLN
1000.0000 mg | Freq: Once | INTRAVENOUS | Status: AC
  Administered 2014-10-06: 1000 mg via INTRAVENOUS
  Filled 2014-10-06: qty 200

## 2014-10-06 MED ORDER — KETOROLAC TROMETHAMINE 30 MG/ML IJ SOLN
30.0000 mg | Freq: Once | INTRAMUSCULAR | Status: AC
  Administered 2014-10-06: 30 mg via INTRAVENOUS
  Filled 2014-10-06: qty 1

## 2014-10-06 MED ORDER — ONDANSETRON HCL 4 MG/2ML IJ SOLN
4.0000 mg | Freq: Once | INTRAMUSCULAR | Status: AC
  Administered 2014-10-06: 4 mg via INTRAVENOUS
  Filled 2014-10-06: qty 2

## 2014-10-06 MED ORDER — LIDOCAINE-EPINEPHRINE 2 %-1:100000 IJ SOLN
INTRAMUSCULAR | Status: AC
  Administered 2014-10-06: 30 mL via INTRADERMAL
  Filled 2014-10-06: qty 1

## 2014-10-06 MED ORDER — HYDROMORPHONE HCL 1 MG/ML IJ SOLN
2.0000 mg | Freq: Once | INTRAMUSCULAR | Status: AC
  Administered 2014-10-06: 2 mg via INTRAVENOUS
  Filled 2014-10-06: qty 2

## 2014-10-06 MED ORDER — HYDROMORPHONE HCL 1 MG/ML IJ SOLN
INTRAMUSCULAR | Status: AC
  Administered 2014-10-06: 2 mg
  Filled 2014-10-06: qty 2

## 2014-10-06 MED ORDER — PIPERACILLIN-TAZOBACTAM 3.375 G IVPB
INTRAVENOUS | Status: AC
  Filled 2014-10-06: qty 50

## 2014-10-06 MED ORDER — PIPERACILLIN-TAZOBACTAM 3.375 G IVPB 30 MIN
3.3750 g | Freq: Once | INTRAVENOUS | Status: AC
  Administered 2014-10-06: 3.375 g via INTRAVENOUS
  Filled 2014-10-06: qty 50

## 2014-10-06 MED ORDER — HYDROMORPHONE HCL 1 MG/ML IJ SOLN
1.0000 mg | Freq: Once | INTRAMUSCULAR | Status: AC
  Administered 2014-10-06: 1 mg via INTRAVENOUS
  Filled 2014-10-06: qty 1

## 2014-10-06 NOTE — ED Notes (Signed)
Patient continue to move around in bed, denies minimal pain relief with medication, at time he is screaming and crying and other times he will stop and talk with no problem, patient continues to rate pain 10/10

## 2014-10-06 NOTE — ED Notes (Signed)
Pt calling out wanting more pain medication. Pt states "I don't want to get fucked up, I'm in pain". EDP [place] notified, no new orders given, pt had received 4mg  of dilaudid, pt states it doesn't last but 15mins. Pt continue to cry and scream out in pain. Pt notified he is waiting to be transfer to [facility] for admission.

## 2014-10-06 NOTE — ED Notes (Signed)
Attempted to call report to floor, RN not available at this time. Left name and number for return call

## 2014-10-06 NOTE — ED Provider Notes (Addendum)
CSN: [id number]     Arrival date & time 10/06/14  0231 History   First MD Initiated Contact with Patient 10/06/14 [phone]     Chief Complaint  Patient presents with  . 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 He recently had a fracture of his left calcaneus. He underwent an open reduction and internal fixation yesterday morning at a surgery center in [place]. He was given a prescription for Dilaudid 2 milligrams, take 1-2 tablets every 4 hours as needed for pain. He also had prescription for oxycodone 10 milligram tablets which he had filled on the first of this month. Despite taking these medications he complains of severe, unrelenting pain in his left ankle. He feels like there is something tightly wrapped around his left ankle. Pain is worse with any attempt to move his left foot or ankle. He thinks his splint is too tight.  Nursing staff noted multiple track marks and abscesses on his forearms. The patient states that these were noted in the surgery center yesterday but no intervention was done. He denies pain at these sites.    Past Medical History  Diagnosis Date  . Asthma   . Anxiety    Past Surgical History  Procedure Laterality Date  . Heel repair     History reviewed. No pertinent family history. History  Substance Use Topics  . Smoking status: Current Every Day Smoker -- 0.50 packs/day    Types: Cigarettes  . Smokeless tobacco: Not on file  . Alcohol Use: No    Review of Systems  All other systems reviewed and are negative.   Allergies  Review of patient's allergies indicates no known allergies.  Home Medications   Prior to Admission medications   Medication Sig Start Date End Date Taking? Authorizing Provider  dicyclomine (BENTYL) 20 MG tablet Take 1 tablet (20 mg total) by mouth 2 (two) times daily. 08/04/14   Renne Crigler,  PA-C  diphenoxylate-atropine (LOMOTIL) 2.5-0.025 MG per tablet Take 2 tablets by mouth 4 (four) times daily as needed for diarrhea or loose stools. 08/04/14   Renne Crigler, PA-C  loperamide (IMODIUM) 2 MG capsule Take 1 capsule (2 mg total) by mouth 4 (four) times daily as needed for diarrhea or loose stools. 08/09/14   Gwyneth Sprout, MD  ondansetron (ZOFRAN ODT) 4 MG disintegrating tablet  ODT q4 hours prn nausea/vomit 07/31/14   Mirian Mo, MD   BP 154/61 mmHg  Pulse 100  Temp(Src) 99.2 F (37.3 C) (Oral)  Resp 18  Ht  (1.88 m)  Wt 160 lb (72.576 kg)  BMI 20.53 kg/m2  SpO2 100%   Physical Exam  General: Well-developed, well-nourished male in no acute distress; appearance consistent with age of record HENT: normocephalic; atraumatic Eyes: pupils equal, round and reactive to light; extraocular muscles intact Neck: supple Heart: regular rate and rhythm Lungs: clear to auscultation bilaterally Abdomen: soft; nondistended Extremities: Left lower leg in splint, on removal of wraps left dorsalis pedis pulses +2, distal capillary refill is brisk, sensation is intact; no bony deformities of other extremities; erythema, track marks and abscesses of forearms as shown:     Neurologic: Awake, alert and oriented; motor function intact in all extremities and symmetric; no facial droop Skin: Warm and dry; see extremity exam Psychiatric: Agitated, moaning and repeatedly demanding something be done for his pain    ED Course  Procedures (including critical care  time)  INCISION AND DRAINAGE Performed by: Paula Libra L Consent: Verbal consent obtained. Risks and benefits: risks, benefits and alternatives were discussed Type: abscess  Body area: mid dorsal right forearm  Anesthesia: local infiltration  Incision was made with a scalpel.  Local anesthetic: lidocaine 2% with epinephrine  Anesthetic total: 2 ml  Complexity: complex Blunt dissection to break up  loculations  Drainage: seropurulent, foul-smelling  Drainage amount: small  Packing material: 1/4 in iodoform gauze  Patient tolerance: Patient tolerated the procedure well with no immediate complications.   INCISION AND DRAINAGE Performed by: Paula Libra L Consent: Verbal consent obtained. Risks and benefits: risks, benefits and alternatives were discussed Type: abscess  Body area: proximal right forearm  Anesthesia: local infiltration  Incision was made with a scalpel.  Local anesthetic: lidocaine 2% with epinephrine  Anesthetic total: 1.5 ml  Complexity: simple Blunt dissection to break up loculations  Drainage: sanguinous  Drainage amount: scant  Packing material: none  Patient tolerance: Patient tolerated the procedure well with no immediate complications.   CRITICAL CARE Performed by: Paula Libra L Total critical care time: 35 minutes Critical care time was exclusive of separately billable procedures and treating other patients. Critical care was necessary to treat or prevent imminent or life-threatening deterioration. Critical care was time spent personally by me on the following activities: development of treatment plan with patient and/or surrogate as well as nursing, discussions with consultants, evaluation of patient's response to treatment, examination of patient, obtaining history from patient or surrogate, ordering and performing treatments and interventions, ordering and review of laboratory studies, ordering and review of radiographic studies, pulse oximetry and re-evaluation of patient's condition.   MDM   Nursing notes and vitals signs, including pulse oximetry, reviewed.  Summary of this visit's results, reviewed by myself:  Labs:  Results for orders placed or performed during the hospital encounter of 10/06/14 (from the past 24 hour(s))  CBC with Differential/Platelet     Status: Abnormal   Collection Time: 10/06/14  3:02 AM  Result Value  Ref Range   WBC 16.1 (H) 4.0 - 10.5 K/uL   RBC 3.95 (L) 4.22 - 5.81 MIL/uL   Hemoglobin 11.7 (L) 13.0 - 17.0 g/dL   HCT 16.1 (L) 09.6 - 04.5 %   MCV 90.9 78.0 - 100.0 fL   MCH 29.6 26.0 - 34.0 pg   MCHC 32.6 30.0 - 36.0 g/dL   RDW 40.9 81.1 - 91.4 %   Platelets 373 150 - 400 K/uL   Neutrophils Relative % 81 (H) 43 - 77 %   Neutro Abs 13.0 (H) 1.7 - 7.7 K/uL   Lymphocytes Relative 8 (L) 12 - 46 %   Lymphs Abs 1.3 0.7 - 4.0 K/uL   Monocytes Relative 9 3 - 12 %   Monocytes Absolute 1.5 (H) 0.1 - 1.0 K/uL   Eosinophils Relative 2 0 - 5 %   Eosinophils Absolute 0.3 0.0 - 0.7 K/uL   Basophils Relative 0 0 - 1 %   Basophils Absolute 0.0 0.0 - 0.1 K/uL  Basic metabolic panel     Status: Abnormal   Collection Time: 10/06/14  3:02 AM  Result Value Ref Range   Sodium 132 (L) 135 - 145 mmol/L   Potassium 3.7 3.5 - 5.1 mmol/L   Chloride 100 96 - 112 mmol/L   CO2 25 19 - 32 mmol/L   Glucose, Bld 147 (H) 70 - 99 mg/dL   BUN 16 6 - 23 mg/dL   Creatinine, Ser 7.82 0.50 -  1.35 mg/dL   Calcium 8.4 8.4 - 16.110.5 mg/dL   GFR calc non Af Amer >90 >90 mL/min   GFR calc Af Amer >90 >90 mL/min   Anion gap 7 5 - 15    4:00 AM Pain improved after 4 milligrams of Dilaudid IV although patient continues to request more. Vancomycin and Zosyn started for soft tissue infections. Given the foul smelling nature of the drainage from the right forearm this likely represents a polymicrobial infection, hence broad coverage.  4:25 AM Dr. Claudell Kyleater accepts [facility]. Dr. Ihor GullyBiggerstaff, who performed the patient's surgery, is on staff there and will be available for consult.   Kenneth SeamenJohn L Johana Hopkinson, MD 10/06/14 0425  Kenneth SeamenJohn L Jeannetta Cerutti, MD 10/06/14 [id number]  Kenneth SeamenJohn L Carely Nappier, MD 10/06/14 [phone]

## 2014-10-06 NOTE — ED Notes (Signed)
Received call from Rondel BatonS. Miller from [facility], regarding the wound culture from right arm.  Called results to Erie NoeVanessa, RN at [facility] where [facility] was [facility].

## 2014-10-06 NOTE — ED Notes (Signed)
Patients mother is penny sheppard cell- [phone] work [phone]

## 2014-10-06 NOTE — ED Notes (Signed)
Pt reports that he last took his dilaudid 4mg  tablet 2 hours ago, without relief

## 2014-10-06 NOTE — ED Notes (Signed)
At time of tx pt cooperative, not thrasing around in the bed,  Able to sit still and keep foot elevated without extreme pain. Mother notified of tx

## 2014-10-06 NOTE — ED Notes (Signed)
[facility] given to [facility]

## 2014-10-06 NOTE — ED Notes (Signed)
Pt reports that he fell on concrete on 2/4, had surgery to left heel on 2/5, tonight developed severe heel pain despite pain medication

## 2014-10-06 NOTE — ED Notes (Signed)
Pt report that he uses heroin daily up until 2 days ago, multiple sores noted to bilateral upper arm, left arm with multple healing scabs to  Anterior arm, right arm with several red and irritated areas secondary to heroin usage. Pt stated that he is unsure how much he uses every day, except that he  Used daily until 2 days ago

## 2014-10-06 NOTE — ED Notes (Signed)
Pt continues to thrash around in the bed, screaming, cursing, pt requested to call his mom, phone was provided. Pt was verbally abusive to mother on the phone, screaming and cursing at her. Clothes packed up and patient instructed on poc

## 2014-10-06 NOTE — ED Notes (Signed)
[facility] at bedside for tx

## 2014-10-08 LAB — CULTURE, ROUTINE-ABSCESS

## 2014-10-31 ENCOUNTER — Observation Stay (HOSPITAL_COMMUNITY)
Admission: EM | Admit: 2014-10-31 | Discharge: 2014-11-02 | Disposition: A | Discharge status: Home / Self Care | Payer: Medicaid Other | Attending: Internal Medicine | Admitting: Internal Medicine

## 2014-10-31 ENCOUNTER — Emergency Department (HOSPITAL_COMMUNITY): Payer: Medicaid Other

## 2014-10-31 ENCOUNTER — Encounter (HOSPITAL_COMMUNITY): Payer: Self-pay | Admitting: Emergency Medicine

## 2014-10-31 DIAGNOSIS — R1013 Epigastric pain: Secondary | ICD-10-CM | POA: Diagnosis not present

## 2014-10-31 DIAGNOSIS — F11220 Opioid dependence with intoxication, uncomplicated: Secondary | ICD-10-CM

## 2014-10-31 DIAGNOSIS — S92009S Unspecified fracture of unspecified calcaneus, sequela: Secondary | ICD-10-CM

## 2014-10-31 DIAGNOSIS — F419 Anxiety disorder, unspecified: Secondary | ICD-10-CM | POA: Insufficient documentation

## 2014-10-31 DIAGNOSIS — G8324 Monoplegia of upper limb affecting left nondominant side: Secondary | ICD-10-CM

## 2014-10-31 DIAGNOSIS — R29898 Other symptoms and signs involving the musculoskeletal system: Secondary | ICD-10-CM | POA: Diagnosis present

## 2014-10-31 DIAGNOSIS — X58XXXD Exposure to other specified factors, subsequent encounter: Secondary | ICD-10-CM | POA: Insufficient documentation

## 2014-10-31 DIAGNOSIS — G5692 Unspecified mononeuropathy of left upper limb: Secondary | ICD-10-CM

## 2014-10-31 DIAGNOSIS — F17210 Nicotine dependence, cigarettes, uncomplicated: Secondary | ICD-10-CM | POA: Diagnosis not present

## 2014-10-31 DIAGNOSIS — J45909 Unspecified asthma, uncomplicated: Secondary | ICD-10-CM | POA: Diagnosis not present

## 2014-10-31 DIAGNOSIS — F1120 Opioid dependence, uncomplicated: Secondary | ICD-10-CM | POA: Diagnosis present

## 2014-10-31 DIAGNOSIS — F1990 Other psychoactive substance use, unspecified, uncomplicated: Secondary | ICD-10-CM

## 2014-10-31 DIAGNOSIS — S92002D Unspecified fracture of left calcaneus, subsequent encounter for fracture with routine healing: Secondary | ICD-10-CM | POA: Insufficient documentation

## 2014-10-31 DIAGNOSIS — Z720 Tobacco use: Secondary | ICD-10-CM | POA: Diagnosis present

## 2014-10-31 DIAGNOSIS — R200 Anesthesia of skin: Secondary | ICD-10-CM | POA: Diagnosis not present

## 2014-10-31 DIAGNOSIS — R079 Chest pain, unspecified: Secondary | ICD-10-CM | POA: Diagnosis present

## 2014-10-31 DIAGNOSIS — S92009A Unspecified fracture of unspecified calcaneus, initial encounter for closed fracture: Secondary | ICD-10-CM | POA: Diagnosis present

## 2014-10-31 DIAGNOSIS — R0789 Other chest pain: Secondary | ICD-10-CM | POA: Diagnosis present

## 2014-10-31 DIAGNOSIS — Z79899 Other long term (current) drug therapy: Secondary | ICD-10-CM | POA: Diagnosis not present

## 2014-10-31 DIAGNOSIS — F1920 Other psychoactive substance dependence, uncomplicated: Secondary | ICD-10-CM

## 2014-10-31 LAB — COMPREHENSIVE METABOLIC PANEL
ALT: 24 U/L (ref 0–53)
ANION GAP: 10 (ref 5–15)
AST: 23 U/L (ref 0–37)
Albumin: 3.8 g/dL (ref 3.5–5.2)
Alkaline Phosphatase: 94 U/L (ref 39–117)
BUN: 13 mg/dL (ref 6–23)
CO2: 26 mmol/L (ref 19–32)
Calcium: 9.7 mg/dL (ref 8.4–10.5)
Chloride: 102 mmol/L (ref 96–112)
Creatinine, Ser: 0.93 mg/dL (ref 0.50–1.35)
GFR calc Af Amer: >90 mL/min (ref >90)
GFR calc non Af Amer: >90 mL/min (ref >90)
Glucose, Bld: 101 mg/dL — ABNORMAL HIGH (ref 70–99)
Potassium: 3.7 mmol/L (ref 3.5–5.1)
Sodium: 138 mmol/L (ref 135–145)
TOTAL PROTEIN: 7.2 g/dL (ref 6.0–8.3)
Total Bilirubin: 0.5 mg/dL (ref 0.3–1.2)

## 2014-10-31 LAB — TROPONIN I
Troponin I: <0.03 ng/mL (ref <0.031)
Troponin I: <0.03 ng/mL (ref <0.031)

## 2014-10-31 LAB — PROTIME-INR
INR: 1 (ref 0.00–1.49)
Prothrombin Time: 13.4 seconds (ref 11.6–15.2)

## 2014-10-31 LAB — RAPID URINE DRUG SCREEN, HOSP PERFORMED
Amphetamines: NOT DETECTED
BENZODIAZEPINES: NOT DETECTED
Barbiturates: NOT DETECTED
COCAINE: POSITIVE — AB
Opiates: NOT DETECTED
Tetrahydrocannabinol: NOT DETECTED

## 2014-10-31 LAB — CBC WITH DIFFERENTIAL/PLATELET
Basophils Absolute: 0.1 10*3/uL (ref 0.0–0.1)
Basophils Relative: 1 % (ref 0–1)
EOS ABS: 0.5 10*3/uL (ref 0.0–0.7)
Eosinophils Relative: 4 % (ref 0–5)
HCT: 41.3 % (ref 39.0–52.0)
HEMOGLOBIN: 13.8 g/dL (ref 13.0–17.0)
LYMPHS PCT: 29 % (ref 12–46)
Lymphs Abs: 3.5 10*3/uL (ref 0.7–4.0)
MCH: 30 pg (ref 26.0–34.0)
MCHC: 33.4 g/dL (ref 30.0–36.0)
MCV: 89.8 fL (ref 78.0–100.0)
MONOS PCT: 7 % (ref 3–12)
Monocytes Absolute: 0.8 10*3/uL (ref 0.1–1.0)
NEUTROS ABS: 7.3 10*3/uL (ref 1.7–7.7)
NEUTROS PCT: 59 % (ref 43–77)
PLATELETS: 411 10*3/uL — AB (ref 150–400)
RBC: 4.6 MIL/uL (ref 4.22–5.81)
RDW: 13.3 % (ref 11.5–15.5)
WBC: 12.1 10*3/uL — ABNORMAL HIGH (ref 4.0–10.5)

## 2014-10-31 LAB — URINALYSIS, ROUTINE W REFLEX MICROSCOPIC
Bilirubin Urine: NEGATIVE
Glucose, UA: NEGATIVE mg/dL
HGB URINE DIPSTICK: NEGATIVE
KETONES UR: NEGATIVE mg/dL
LEUKOCYTES UA: NEGATIVE
Nitrite: NEGATIVE
PH: 5 (ref 5.0–8.0)
PROTEIN: NEGATIVE mg/dL
Specific Gravity, Urine: 1.03 (ref 1.005–1.030)
Urobilinogen, UA: 0.2 mg/dL (ref 0.0–1.0)

## 2014-10-31 LAB — LIPASE, BLOOD: LIPASE: 35 U/L (ref 11–59)

## 2014-10-31 LAB — I-STAT CG4 LACTIC ACID, ED: LACTIC ACID, VENOUS: 1.63 mmol/L (ref 0.5–2.0)

## 2014-10-31 MED ORDER — OXYCODONE-ACETAMINOPHEN 5-325 MG PO TABS
1.0000 | ORAL_TABLET | Freq: Once | ORAL | Status: AC
  Administered 2014-10-31: 1 via ORAL
  Filled 2014-10-31: qty 1

## 2014-10-31 MED ORDER — OXYCODONE HCL 5 MG PO TABS
5.0000 mg | ORAL_TABLET | Freq: Three times a day (TID) | ORAL | Status: DC | PRN
  Administered 2014-10-31: 5 mg via ORAL
  Filled 2014-10-31: qty 1

## 2014-10-31 MED ORDER — ENOXAPARIN SODIUM 40 MG/0.4ML ~~LOC~~ SOLN
40.0000 mg | Freq: Every day | SUBCUTANEOUS | Status: DC
  Administered 2014-10-31 – 2014-11-02 (×3): 40 mg via SUBCUTANEOUS
  Filled 2014-10-31 (×3): qty 0.4

## 2014-10-31 MED ORDER — OXYCODONE HCL 5 MG PO TABS
5.0000 mg | ORAL_TABLET | Freq: Once | ORAL | Status: AC
Start: 2014-10-31 — End: 2014-10-31
  Administered 2014-10-31: 5 mg via ORAL
  Filled 2014-10-31: qty 1

## 2014-10-31 MED ORDER — KETOROLAC TROMETHAMINE 30 MG/ML IJ SOLN
30.0000 mg | Freq: Once | INTRAMUSCULAR | Status: AC
  Administered 2014-10-31: 30 mg via INTRAVENOUS
  Filled 2014-10-31: qty 1

## 2014-10-31 MED ORDER — OXYCODONE HCL 5 MG PO TABS: 10.0000 mg | ORAL_TABLET | Freq: Three times a day (TID) | ORAL | Status: DC | PRN

## 2014-10-31 MED ORDER — GADOBENATE DIMEGLUMINE 529 MG/ML IV SOLN
15.0000 mL | Freq: Once | INTRAVENOUS | Status: AC | PRN
Start: 2014-10-31 — End: 2014-10-31
  Administered 2014-10-31: 15 mL via INTRAVENOUS

## 2014-10-31 MED ORDER — ALBUTEROL SULFATE (2.5 MG/3ML) 0.083% IN NEBU: 2.5000 mg | INHALATION_SOLUTION | RESPIRATORY_TRACT | Status: DC | PRN

## 2014-10-31 MED ORDER — OXYCODONE HCL 5 MG PO TABS
10.0000 mg | ORAL_TABLET | Freq: Four times a day (QID) | ORAL | Status: DC | PRN
  Administered 2014-10-31 – 2014-11-02 (×7): 10 mg via ORAL
  Filled 2014-10-31 (×7): qty 2

## 2014-10-31 MED ORDER — IOHEXOL 350 MG/ML SOLN
80.0000 mL | Freq: Once | INTRAVENOUS | Status: AC | PRN
  Administered 2014-10-31: 80 mL via INTRAVENOUS

## 2014-10-31 MED ORDER — GI COCKTAIL ~~LOC~~
30.0000 mL | Freq: Once | ORAL | Status: AC
  Administered 2014-10-31: 30 mL via ORAL
  Filled 2014-10-31: qty 30

## 2014-10-31 NOTE — ED Notes (Signed)
Breakfast tray ordered 

## 2014-10-31 NOTE — ED Notes (Signed)
Pt brought to ED from home by [facility] for c/o epigastric pain 9/10, numbness and tingling on left arm and difference on SBP in between arms. Pt states he woke up with a sharp epigastric pain and the numbness and tingling on his left arm that gets worse with deep breathing. SBP on left arm 164/101 and SBP on right arm 147/109. HR 82, O2 sat 97% on room air, pt had a food surgery 2 weeks ago.

## 2014-10-31 NOTE — ED Notes (Signed)
Returned from radiology. 

## 2014-10-31 NOTE — H&P (Signed)
Date: 10/31/2014               Patient Name:  Kenneth Charles MRN: [id number]  DOB: Apr 13, 1981 Age / Sex: 34 y.o., male   PCP: Pcp Not In System              Medical Service: Internal Medicine Teaching Service              Attending Physician: Dr. Earl Lagos, MD    First Contact: Dr. Senaida Ores Pager: [phone]  Second Contact: Dr. Mariea Clonts Pager: [phone]            After Hours (After 5p/  First Contact Pager: [phone]  weekends / holidays): Second Contact Pager: [phone]   Chief Complaint: Left arm weakness, chest pain and shortness of breath  History of Present Illness: This is a 34 year old gentleman with past medical history of asthma, IV drug use, and recent fracture of the left calcaneus bone, status post surgery on 2/4 who presents with acute onset of left arm weakness and chest pains. He went to bed last night feeling well before he woke up at around 11 PM with numbness and weakness involving the left upper extremity. He notes that he was unable to move his arm. He denies trauma or falls on that arm. He denies excessive use of his crutches as he has only been using them minimally (less than 5 minutes yesterday). Currently he is unable to use his crutches at all, dressing himself, or to transfer due to his recent fracture and now with left arm weakness. He has been living home since leaving the hospital 4 weeks ago following his surgery.   He also described shortness of breath and chest pain which have since resolved with IV 30 mg of Toradol, Percocet 5-325 and GI cocktail administered in the ED. He described his chest pain as sharp, 7/10 intermittent, last several seconds and was increased with taking a deep breath. No pulmonary symptoms like cough, wheezing or chest tightness. He denies any constitutional symptoms. Patient endorses history of IV drug use with IV heroin a year ago and shooting cocaine a month ago. His UDS was positive for cocaine. He also smokes  cigarettes, half pack a day for the last 15 years. He has no cardiac history.  EKG, chest x-ray, chest CTA and brain MRI done in the emergency department where all unremarkable. Due to his significant limitation, internal medicine was consulted to admit the patient for evaluation of this left arm weakness and possibility of rehabilitation placement since patient is unable take care of himself.  Review of Systems: HEENT: No URI symptoms. No neck pain or photophobia Respiratory: Currently no SOB, DOE, cough, chest tightness, and wheezing. No hemoptysis.  Cardiovascular: Chest pain has improved. Denies palpitations and leg swelling. No PND or Orthopnea. Gastrointestinal: No N/V or diarrhea. No abdominal pain. No hematochezia or melena.  Genitourinary: No dysuria, urgency, frequency, hematuria, flank pain and difficulty urinating.  Musculoskeletal: Pain in the left ankle area since his surgery. No arthralgias and gait problem.  Skin: Has bumps in cocaine shooting areas in the arm. Otherwise no rash and wound. No easy bruising. Neurological: No dizziness, seizures, syncope, weakness, LH, numbness and headaches.  Psychiatric/Behavioral: No SI, mood changes, confusion, nervousness, sleep disturbance and agitation  Meds: No current facility-administered medications for this encounter.   Current Outpatient Prescriptions  Medication Sig Dispense Refill  . Oxycodone HCl 10 MG TABS Take 1 tablet by mouth every 6 (six) hours as needed (  for pain).     Marland Kitchen. dicyclomine (BENTYL) 20 MG tablet Take 1 tablet (20 mg total) by mouth 2 (two) times daily. (Patient not taking: Reported on 10/31/2014) 20 tablet 0  . diphenoxylate-atropine (LOMOTIL) 2.5-0.025 MG per tablet Take 2 tablets by mouth 4 (four) times daily as needed for diarrhea or loose stools. (Patient not taking: Reported on 10/31/2014) 30 tablet 0  . loperamide (IMODIUM) 2 MG capsule Take 1 capsule (2 mg total) by mouth 4 (four) times daily as needed for diarrhea  or loose stools. (Patient not taking: Reported on 10/31/2014) 12 capsule 0  . ondansetron (ZOFRAN ODT) 4 MG disintegrating tablet 4mg  ODT q4 hours prn nausea/vomit (Patient not taking: Reported on 10/31/2014) 15 tablet 0    Allergies: Allergies as of 10/31/2014  . (No Known Allergies)   Past Medical History  Diagnosis Date  . Asthma   . Anxiety   . Heroin addiction    Past Surgical History  Procedure Laterality Date  . Orif calcaneal fracture Left 10/05/2014   History reviewed. No pertinent family history. History   Social History  . Marital Status: Single    Spouse Name: N/A  . Number of Children: N/A  . Years of Education: N/A   Occupational History  . Not on file.   Social History Main Topics  . Smoking status: Current Every Day Smoker -- 0.50 packs/day    Types: Cigarettes  . Smokeless tobacco: Current User  . Alcohol Use: No  . Drug Use: Yes     Comment: heroin use, daily  . Sexual Activity: Not on file   Other Topics Concern  . Not on file   Social History Narrative  Used to work as a Hydrologistmaintenance engineer at [employer] before he quit following a wrist fracture on the right  6 months ago. He is divorced and lives by himself. He has 2 children. His immediate family are his 2 parents who live in town.  Physical Exam: Blood pressure 130/82, pulse 85, temperature 99.4 F (37.4 C), temperature source Oral, resp. rate 19, height 6\' 3"  (1.905 m), weight 160 lb (72.576 kg), SpO2 96 %.  General:  Chronically ill-appearing, poor personal hygiene, well nourished; no acute distress, cooperative with exam HEENT: Neck is supple. No neck tenderness. Head is Atraumatic. Normal EOM. Pupils equal, round and reactive; anicteric. Nose/throat: oropharynx clear, moist mucous membranes, pink gums  Lungs/Chest wall: CTA bil, no chest wall tenderness, normal work of breathing  Heart: normal RRR; no murmurs. No JVD Pulses: radial and dorsalis pedis pulses are 2+ and symmetric    Abdomen: Normal fullness, no rebound, guarding, or rigidity; nl BS; no palpable masses.  Skin:  IV injection tracks visible on both upper extremities. There are areas that have firm bumps. No signs of active infection. Extremities:  Surgical incision on the left ankle area appears to be healing nicely. Peripheral saturation is normal. No peripheral edema, clubbing, or cyanosis  Neurologic Exam:   Mental Status: Alert, oriented, thought content appropriate.  Speech fluent without evidence of aphasia. Able to follow 3 step commands without difficulty.  Cranial Nerves:   II: Visual fields grossly intact.  III/IV/VI: Extraocular movements intact.  Pupils reactive bilaterally.  V/VII: Smile symmetric. facial light touch sensation normal bilaterally.  VIII: Grossly intact.  IX/X: Normal gag.  XI: Bilateral shoulder shrug normal.  XII: Midline tongue extension normal.  Motor:  5/5 bilaterally with normal tone and bulk except in the left upper extremity where he has  significant hand weakness with inability to pronate and supinate his arm. There is sensory deficits involving radial nerve distribution. Medial and ulna nerve dermatomes are spared. Altered sensation in the forearm as well involving the medial aspect. Unable to flex or extend his wrist. Tinel and Phanel signs are negative. No areas of tenderness.   Sensory:  Pinprick and light touch intact throughout, bilaterally except as noted above.  DTRs: 2+ and symmetric throughout  Plantars:  Downgoing bilaterally  Cerebellar: Normal finger-to-nose, normal rapid alternating movements and normal heel-to-shin test.  Normal gait and station.   Psych: Normal mood and affect. Normal speech and behavior. No agitation   Lab results: Basic Metabolic Panel:  Recent Labs  40/98/11 0158  NA 138  K 3.7  CL 102  CO2 26  GLUCOSE 101*  BUN 13  CREATININE 0.93  CALCIUM 9.7   Liver Function Tests:  Recent Labs  10/31/14 0158  AST 23  ALT 24   ALKPHOS 94  BILITOT 0.5  PROT 7.2  ALBUMIN 3.8    Recent Labs  10/31/14 0158  LIPASE 35  CBC:  Recent Labs  10/31/14 0158  WBC 12.1*  NEUTROABS 7.3  HGB 13.8  HCT 41.3  MCV 89.8  PLT 411*   Cardiac Enzymes:  Recent Labs  10/31/14 0445  TROPONINI <0.03    Recent Labs  10/31/14 0158  LABPROT 13.4  INR 1.00   Urine Drug Screen: Drugs of Abuse     Component Value Date/Time   LABOPIA NONE DETECTED 10/31/2014 0527   COCAINSCRNUR POSITIVE* 10/31/2014 0527   LABBENZ NONE DETECTED 10/31/2014 0527   AMPHETMU NONE DETECTED 10/31/2014 0527   THCU NONE DETECTED 10/31/2014 0527   LABBARB NONE DETECTED 10/31/2014 0527    Urinalysis:  Recent Labs  10/31/14 0527  COLORURINE YELLOW  LABSPEC 1.030  PHURINE 5.0  GLUCOSEU NEGATIVE  HGBUR NEGATIVE  BILIRUBINUR NEGATIVE  KETONESUR NEGATIVE  PROTEINUR NEGATIVE  UROBILINOGEN 0.2  NITRITE NEGATIVE  LEUKOCYTESUR NEGATIVE    Imaging results:  Ct Head Wo Contrast  10/31/2014   CLINICAL DATA:  Acute onset of left arm numbness and tingling, with difference in systolic blood pressure between the arms. Initial encounter.  EXAM: CT HEAD WITHOUT CONTRAST  TECHNIQUE: Contiguous axial images were obtained from the base of the skull through the vertex without intravenous contrast.  COMPARISON:  None.  FINDINGS: There is no evidence of acute infarction, mass lesion, or intra- or extra-axial hemorrhage on CT.  The posterior fossa, including the cerebellum, brainstem and fourth ventricle, is within normal limits. The third and lateral ventricles, and basal ganglia are unremarkable in appearance. The cerebral hemispheres are symmetric in appearance, with normal gray-white differentiation. No mass effect or midline shift is seen.  There is no evidence of fracture; visualized osseous structures are unremarkable in appearance. The visualized portions of the orbits are within normal limits. The paranasal sinuses and mastoid air cells are  well-aerated. No significant soft tissue abnormalities are seen.  IMPRESSION: Unremarkable noncontrast CT of the head.   Electronically Signed   By: Roanna Raider M.D.   On: 10/31/2014 03:43   Ct Angio Chest Pe W/cm &/or Wo Cm  10/31/2014   CLINICAL DATA:  Acute onset of epigastric abdominal pain, left arm numbness and tingling, and difference in systolic blood pressure between the arms. Left arm tingling and numbness worsens with deep breaths. Initial encounter.  EXAM: CT ANGIOGRAPHY CHEST WITH CONTRAST  TECHNIQUE: Multidetector CT imaging of the chest was performed using  the standard protocol during bolus administration of intravenous contrast. Multiplanar CT image reconstructions and MIPs were obtained to evaluate the vascular anatomy.  CONTRAST:  80mL OMNIPAQUE IOHEXOL 350 MG/ML SOLN  COMPARISON:  None.  FINDINGS: There is no evidence of aortic dissection. There is no evidence of aneurysmal dilatation. No calcific atherosclerotic disease is seen. The great vessels are patent and unremarkable in appearance.  There is no evidence of pulmonary embolus.  Minimal bibasilar atelectasis is noted. The lungs are otherwise clear. There is no evidence of significant focal consolidation, pleural effusion or pneumothorax. No masses are identified; no abnormal focal contrast enhancement is seen.  The mediastinum is unremarkable in appearance. No mediastinal lymphadenopathy is seen. No pericardial effusion is identified. No axillary lymphadenopathy is seen. The visualized portions of the thyroid gland are unremarkable in appearance.  The visualized portions of the liver and spleen are unremarkable.  No acute osseous abnormalities are seen.  Review of the MIP images confirms the above findings.  IMPRESSION: 1. No evidence of aortic dissection. No evidence of aneurysmal dilatation. No calcific atherosclerotic disease seen. Great vessels patent and unremarkable in appearance. 2. No evidence of pulmonary embolus. 3. Minimal  bibasilar atelectasis noted; lungs otherwise clear.   Electronically Signed   By: Roanna Raider M.D.   On: 10/31/2014 03:47   Mr Laqueta Jean ZO Contrast  10/31/2014   CLINICAL DATA:  34 year old male with history of IVDU, now with acute inability to flex/extend left upper extremity with loss of sensation. Concern for possible septic emboli.  EXAM: MRI HEAD WITHOUT AND WITH CONTRAST  TECHNIQUE: Multiplanar, multiecho pulse sequences of the brain and surrounding structures were obtained without and with intravenous contrast.  CONTRAST:  15mL MULTIHANCE GADOBENATE DIMEGLUMINE 529 MG/ML IV SOLN  COMPARISON:  Prior head CT performed earlier on the same day.  FINDINGS: The CSF containing spaces are within normal limits for patient age. No focal parenchymal signal abnormality is identified. No mass lesion, midline shift, or extra-axial fluid collection. Ventricles are normal in size without evidence of hydrocephalus.  No diffusion-weighted signal abnormality is identified to suggest acute intracranial infarct. Gray-white matter differentiation is maintained. Normal flow voids are seen within the intracranial vasculature. No intracranial hemorrhage identified.  No abnormal enhancement identified.  The cervicomedullary junction is normal. Pituitary gland is within normal limits. Pituitary stalk is midline. The globes and optic nerves demonstrate a normal appearance with normal signal intensity.  The bone marrow signal intensity is normal. Calvarium is intact. Visualized upper cervical spine is within normal limits.  Scalp soft tissues are unremarkable.  Paranasal sinuses are clear.  No mastoid effusion.  IMPRESSION: Normal contrast enhanced MRI of the brain with no acute intracranial abnormality identified.   Electronically Signed   By: Rise Mu M.D.   On: 10/31/2014 06:45   Dg Chest Port 1 View  10/31/2014   CLINICAL DATA:  Chest pain and paralysis of the left hand.  EXAM: PORTABLE CHEST - 1 VIEW  COMPARISON:   02/24/2012  FINDINGS: The heart size and mediastinal contours are within normal limits. Both lungs are clear. The visualized skeletal structures are unremarkable.  IMPRESSION: Negative portable chest.   Electronically Signed   By: Marnee Spring M.D.   On: 10/31/2014 02:13    Other results: EKG: normal EKG, normal sinus rhythm, there are no previous tracings available for comparison, normal sinus rhythm, nonspecific ST and T waves changes.  Assessment & Plan by Problem: Principal Problem:   Left arm weakness Active Problems:  Current tobacco use   Calcaneal fracture   Polydrug dependence including opioid type drug, episodic abuse   Atypical chest pain   Acute left arm weakness: Differentials include a radial nerve palsy and possibly carpal tunnel syndrome and brachial plexus palsy though patient states that he minimally uses his crutches. Patient is currently unable take care of himself due to this acute onset of left arm weakness/numbness in the setting of recent lower extremity surgery. Patient is at risk of embolic stroke due to IV drug use, however Brain MRI was negative for acute ischemic event. No other neurologic deficits identified on exam.  Plan -Obtain x-ray of the left forearm and hand to exclude any acute fractures that might lead to radial nerve entrapment. -Physical therapy. -Occupational therapy. -Consult social work for consideration of rehabilitation placement. -Consider consultation with neurology. Outpatient nerve conduction studies can be performed.   Atypical Chest Pain: Patient initial presentation with left-sided chest pain and shortness of breath was concerning for pulmonary embolism versus acute coronary syndrome. He received IV Toradol, Percocet, and a GI cocktail before his chest pain resolved in the ED. EKG was negative for cardiac ischemia. Chest CTA negative for pulmonary embolism, pneumonia or aortic dissection. Likely his chest pain was induced by cocaine use  or even pericarditis. Musculoskeletal pain is also a possibility. 2 D Echo 10/08/2014 was normal.  However GI etiology is a possibility.  Plan -Clinical monitoring - Cycle troponins, and repeat EKG with chest pain - Consider echocardiogram if chest pain recurs  IV drug use: Patient states that last used cocaine a month ago, but UDS was positive for cocaine. He also has a history of heroin use with last use being 1 yr ago per his report. Counseled the patient on cessation of these drugs.  - consult Child psychotherapist.    Left calcaneal fracture: Status post ORIF done at an outside hospital 4 weeks ago. Surgical incision site is healing nicely and there is no sign of acute infection. Plan -PO oxycodone 5 mg every 8 hours   Code Status: FULL CODE  F/E/N: Regular diet   VTE Ppx: Lovenox  Family Communication: Discussed with the patient about plan of his care. All questions answered.   Dispo: Mr. Purnell might require rehabilitation placement if he is left arm weakness persists and if he remains clinically stable. Social work has been consulted.  The patient does not have a current PCP (Pcp Not In System), therefore will be require OPC follow-up after discharge.   The patient does not have transportation limitations that hinder transportation to clinic appointments.   Signed:  Dow Adolph, MD PGY-3 Internal Medicine Teaching Service [phone] (7a-7p) 10/31/2014, 8:08 AM

## 2014-10-31 NOTE — Evaluation (Signed)
Physical Therapy Evaluation Patient Details Name: Kenneth Charles MRN: [id number] DOB: July 03, 1981 Today's Date: 10/31/2014   History of Present Illness  Adm due to weakness LUE (pt with fall off deck ~4 weeks ago; s/p Lt calcaneal ORIF-NWB) PMHx- polysubstance abuse (+for cocaine on adm)    Clinical Impression  Patient presents with Lt arm weakness with brain MRI negative and cervical MRI pending. Pt with inconsistent performance and effort with strength and ROM testing (further details below). Pt refused to attempt transfers or ambulation with PT due to Lt heel pain and awaiting pain medicine. He reported he walked/hopped into bathroom with nursing assist and use of only Rt arm on RW. RN reported (outside pt's room) that he used both arms to hold onto walker and support himself while walking NWB LLE. If no neurological explanation found for patient's symptoms, anticipate he will progress quickly with mobility and be safe to d/c home with rolling walker (he reports he has fallen with his crutches) and no f/u PT. Will follow.  ROM/strength testing of Lt UE:  -Asked to touch his nose and pt demonstrated ability to flex elbow to ~100 degrees "but I can't go any further" as he strained with isometric contraction of biceps and triceps;  -when assisted to flex shoulder to 120 degrees, he maintained full elbow extension (against gravity--at least 3/5 strength), however with testing triceps, he demonstrated 2/5 with contraction of muscle "giving away" then active, then giving away;  -when testing supination, he maintained full wrist flexion as he assisted with "turning palm up", however when later specifically tested wrist flexion, he was unable to elicit a contraction    Follow Up Recommendations  (TBA as progresses; noted pt with no insurance on file)    Equipment Recommendations  Rolling walker with 5" wheels (pt desires wheelchair--TBA if needed)    Recommendations for Other Services OT  consult     Precautions / Restrictions Precautions Precautions: Fall Restrictions LLE Weight Bearing: Non weight bearing      Mobility  Bed Mobility Overal bed mobility: Modified Independent             General bed mobility comments: scooting himself up to [facility], then down to prop foot on foot of bed; rolling  Transfers                 General transfer comment: pt refused;   Ambulation/Gait             General Gait Details: pt refused; pt reports RN assisted him to walk to bathroom with RW; pt reported he used only his Rt hand on RW, however RN reports he used both hands to grip walker and support himself in NWB LLE  Stairs            Wheelchair Mobility    Modified Rankin (Stroke Patients Only)       Balance                                             Pertinent Vitals/Pain Pain Assessment: 0-10 Pain Score: 8  Pain Location: Lt heel Pain Intervention(s): Limited activity within patient's tolerance;Monitored during session    Home Living Family/patient expects to be discharged to:: Private residence Living Arrangements: Alone Available Help at Discharge: Family;Available PRN/intermittently (parents local) Type of Home: Apartment Home Access: Stairs to enter Entrance Stairs-Rails: None Entrance Stairs-Number of  Steps: 1 Home Layout: One level Home Equipment: Crutches      Prior Function Level of Independence: Independent with assistive device(s)         Comments: reports he was told to limit walking and keep LLE elevated above his heart; walked as little as possible; reports he fell with crutches x 1     Hand Dominance   Dominant Hand: Left    Extremity/Trunk Assessment   Upper Extremity Assessment: LUE deficits/detail       LUE Deficits / Details: pt with inconsistent abilities throughout testing; see clinical impression   Lower Extremity Assessment: LLE deficits/detail   LLE Deficits / Details: AROM  hip and knee WFL; ankle NT; strength hip and knee at least 3/5     Communication   Communication: No difficulties  Cognition Arousal/Alertness: Awake/alert Behavior During Therapy: Anxious (waiting for pain medicine) Overall Cognitive Status: Within Functional Limits for tasks assessed                      General Comments      Exercises        Assessment/Plan    PT Assessment Patient needs continued PT services  PT Diagnosis Difficulty walking   PT Problem List Decreased strength;Decreased balance;Decreased mobility;Decreased knowledge of use of DME;Decreased safety awareness;Impaired sensation;Pain  PT Treatment Interventions DME instruction;Gait training;Functional mobility training;Therapeutic activities;Therapeutic exercise;Balance training;Patient/family education   PT Goals (Current goals can be found in the Care Plan section) Acute Rehab PT Goals Patient Stated Goal: get Lt arm working PT Goal Formulation: With patient Time For Goal Achievement: 11/03/14 Potential to Achieve Goals: Good    Frequency Min 4X/week   Barriers to discharge Decreased caregiver support      Co-evaluation               End of Session   Activity Tolerance: Patient limited by pain (refused further mobility as in too much pain ) Patient left: in bed;with call bell/phone within reach;with bed alarm set;with nursing/sitter in room Nurse Communication: Mobility status    Functional Assessment Tool Used: clinical judgement Functional Limitation: Mobility: Walking and moving around Mobility: Walking and Moving Around Current Status ([id number]): At least 20 percent but less than 40 percent impaired, limited or restricted Mobility: Walking and Moving Around Goal Status [phone]([id number]): At least 1 percent but less than 20 percent impaired, limited or restricted    Time: [id number] PT Time Calculation (min) (ACUTE ONLY): 19 min   Charges:   PT Evaluation $Initial PT Evaluation Tier I: 1  Procedure     PT G Codes:   PT G-Codes **NOT FOR INPATIENT CLASS** Functional Assessment Tool Used: clinical judgement Functional Limitation: Mobility: Walking and moving around Mobility: Walking and Moving Around Current Status ([id number](G8978): At least 20 percent but less than 40 percent impaired, limited or restricted Mobility: Walking and Moving Around Goal Status [phone]([id number]): At least 1 percent but less than 20 percent impaired, limited or restricted    Gissell Barra 10/31/2014, 4:01 PM Pager [phone]

## 2014-10-31 NOTE — Progress Notes (Signed)
Pt arrived to 4N02 at 1012.  Pt A&O x 4, c/o 9/10 L foot surgical pain, site OTA with sutures, no drains.   Pt V/S taken,     Pt without distress.  . Diet ordered, MD paged upon admission, will monitor.

## 2014-10-31 NOTE — ED Notes (Signed)
Patient transported to X-ray 

## 2014-10-31 NOTE — ED Notes (Signed)
CT called and informed pt has IV access for CT angio.

## 2014-10-31 NOTE — ED Notes (Signed)
MD at bedside. 

## 2014-10-31 NOTE — ED Provider Notes (Addendum)
CSN: [id number]     Arrival date & time 10/31/14  0113 History   None    This chart was scribed for Olivia Mackielga M Kerolos Nehme, MD by Arlan OrganAshley Leger, ED Scribe. This patient [facility] and the patient's care was started 1:43 AM.   Chief Complaint  Patient presents with  . Abdominal Pain  . Numbness    left arm  . Tingling    left arm   The history is provided by the patient. No language interpreter was used.    HPI Comments: Kenneth Charles brought in by ambulance is a 34 y.o. male with a PMHx of asthma and heroin addiction who presents to the Emergency Department complaining of constant, moderate epigastric abdominal pain/retrosternal pain x 3 hours. Pain is described as sharp, rated 9/10, and exacerbated with deep breathing. Pt also reports constant numbness, weakness, and tingling to L arm. Kenneth Charles states he woke from sleep with symptoms. Last known normal at 5-6 PM yesterday evening. Pt recently had L calcaneal fracture repair surgery performed by Dr. Ihor GullyBiggerstaff 2/4 after shattering his heel; surgery performed at a facility around [place]. Scheduled follow up with surgeon in 1 week. Pt was admitted to hospital 2/6-2/9 after surgery for cellulitis of R foot and abscess to arm secondary to IV drug use. Kenneth Charles was then seen again 2/13 in ED for weakness and numbness to both arms and discharged home. Pt was admitted for a second time 2/24-2/26 for swelling around surgical site. No known allergies to medications.  Past Medical History  Diagnosis Date  . Asthma   . Anxiety   . Heroin addiction    Past Surgical History  Procedure Laterality Date  . Orif calcaneal fracture Left 10/05/2014   History reviewed. No pertinent family history. History  Substance Use Topics  . Smoking status: Current Every Day Smoker -- 0.50 packs/day    Types: Cigarettes  . Smokeless tobacco: Current User  . Alcohol Use: No    Review of Systems  Constitutional: Positive for chills.   Gastrointestinal: Positive for abdominal pain.  Neurological: Positive for weakness and numbness.  All other systems reviewed and are negative.     Allergies  Review of patient's allergies indicates no known allergies.  Home Medications   Prior to Admission medications   Medication Sig Start Date End Date Taking? Authorizing Provider  dicyclomine (BENTYL) 20 MG tablet Take 1 tablet (20 mg total) by mouth 2 (two) times daily. 08/04/14   Renne CriglerJoshua Geiple, PA-C  diphenoxylate-atropine (LOMOTIL) 2.5-0.025 MG per tablet Take 2 tablets by mouth 4 (four) times daily as needed for diarrhea or loose stools. 08/04/14   Renne CriglerJoshua Geiple, PA-C  loperamide (IMODIUM) 2 MG capsule Take 1 capsule (2 mg total) by mouth 4 (four) times daily as needed for diarrhea or loose stools. 08/09/14   Gwyneth SproutWhitney Plunkett, MD  ondansetron (ZOFRAN ODT) 4 MG disintegrating tablet 4mg  ODT q4 hours prn nausea/vomit 07/31/14   Mirian MoMatthew Gentry, MD   Triage Vitals: BP 160/103 mmHg  Pulse 87  Temp(Src) 99.4 F (37.4 C) (Oral)  Resp 21  Ht 6\' 3"  (1.905 m)  Wt 160 lb (72.576 kg)  BMI 20.00 kg/m2  SpO2 97%  Physical Exam  Constitutional: He is oriented to person, place, and time. He appears well-developed and well-nourished. He appears distressed.  HENT:  Head: Normocephalic and atraumatic.  Nose: Nose normal.  Mouth/Throat: Oropharynx is clear and moist. No oropharyngeal exudate.  Eyes: Conjunctivae and EOM are normal. Pupils  are equal, round, and reactive to light.  Neck: Normal range of motion.  Cardiovascular: Normal rate, regular rhythm, normal heart sounds and intact distal pulses.   No murmur appreciated  Pulmonary/Chest: Effort normal and breath sounds normal. No respiratory distress.  Abdominal: Soft. He exhibits no distension. There is no tenderness.  Musculoskeletal: Normal range of motion.  Patient has multiple track marks, feet, arms.  Multiple areas of eschar.  No overt abscesses or cellulitis.  Refill in left  arm is normal.  Pulses are normal in all extremities.  Patient's left heel has a darkened appearance at the surgical site without necrosis.  Refill is sluggish in this area.  Area is tender to palpation around the incision site.  No warmth or drainage.  No crepitus.  Neurological: He is alert and oriented to person, place, and time. He has normal reflexes. He displays normal reflexes. No cranial nerve deficit. He exhibits normal muscle tone. Coordination (patient has weakness of left arm from elbow down.  He is unable to supinate or pronate.  He is I am unable to flex or extend at the wrist.  He has a 2 out of 5 grip strength on the left.  He reports decreased sensation starting at the elbow out to the fing) abnormal.  Skin: Skin is warm and dry.  Psychiatric: He has a normal mood and affect. Judgment normal.  Nursing note and vitals reviewed.   ED Course  Procedures (including critical care time)  DIAGNOSTIC STUDIES: Oxygen Saturation is 97% on RA, adequate by my interpretation.    COORDINATION OF CARE: 1:51 AM- Will order CBC, CMP, lipase, and EKG. Discussed treatment plan with pt at bedside and pt agreed to plan.     Labs Review Labs Reviewed  CBC WITH DIFFERENTIAL/PLATELET - Abnormal; Notable for the following:    WBC 12.1 (*)    Platelets 411 (*)    All other components within normal limits  COMPREHENSIVE METABOLIC PANEL - Abnormal; Notable for the following:    Glucose, Bld 101 (*)    All other components within normal limits  CULTURE, BLOOD (ROUTINE X 2)  CULTURE, BLOOD (ROUTINE X 2)  URINE CULTURE  LIPASE, BLOOD  PROTIME-INR  URINE RAPID DRUG SCREEN (HOSP PERFORMED)  URINALYSIS, ROUTINE W REFLEX MICROSCOPIC  TROPONIN I  I-STAT CG4 LACTIC ACID, ED    Imaging Review Ct Head Wo Contrast  10/31/2014   CLINICAL DATA:  Acute onset of left arm numbness and tingling, with difference in systolic blood pressure between the arms. Initial encounter.  EXAM: CT HEAD WITHOUT CONTRAST   TECHNIQUE: Contiguous axial images were obtained from the base of the skull through the vertex without intravenous contrast.  COMPARISON:  None.  FINDINGS: There is no evidence of acute infarction, mass lesion, or intra- or extra-axial hemorrhage on CT.  The posterior fossa, including the cerebellum, brainstem and fourth ventricle, is within normal limits. The third and lateral ventricles, and basal ganglia are unremarkable in appearance. The cerebral hemispheres are symmetric in appearance, with normal gray-white differentiation. No mass effect or midline shift is seen.  There is no evidence of fracture; visualized osseous structures are unremarkable in appearance. The visualized portions of the orbits are within normal limits. The paranasal sinuses and mastoid air cells are well-aerated. No significant soft tissue abnormalities are seen.  IMPRESSION: Unremarkable noncontrast CT of the head.   Electronically Signed   By: Roanna Raider M.D.   On: 10/31/2014 03:43   Ct Angio Chest Pe W/cm &/  or Wo Cm  10/31/2014   CLINICAL DATA:  Acute onset of epigastric abdominal pain, left arm numbness and tingling, and difference in systolic blood pressure between the arms. Left arm tingling and numbness worsens with deep breaths. Initial encounter.  EXAM: CT ANGIOGRAPHY CHEST WITH CONTRAST  TECHNIQUE: Multidetector CT imaging of the chest was performed using the standard protocol during bolus administration of intravenous contrast. Multiplanar CT image reconstructions and MIPs were obtained to evaluate the vascular anatomy.  CONTRAST:  80mL OMNIPAQUE IOHEXOL 350 MG/ML SOLN  COMPARISON:  None.  FINDINGS: There is no evidence of aortic dissection. There is no evidence of aneurysmal dilatation. No calcific atherosclerotic disease is seen. The great vessels are patent and unremarkable in appearance.  There is no evidence of pulmonary embolus.  Minimal bibasilar atelectasis is noted. The lungs are otherwise clear. There is no  evidence of significant focal consolidation, pleural effusion or pneumothorax. No masses are identified; no abnormal focal contrast enhancement is seen.  The mediastinum is unremarkable in appearance. No mediastinal lymphadenopathy is seen. No pericardial effusion is identified. No axillary lymphadenopathy is seen. The visualized portions of the thyroid gland are unremarkable in appearance.  The visualized portions of the liver and spleen are unremarkable.  No acute osseous abnormalities are seen.  Review of the MIP images confirms the above findings.  IMPRESSION: 1. No evidence of aortic dissection. No evidence of aneurysmal dilatation. No calcific atherosclerotic disease seen. Great vessels patent and unremarkable in appearance. 2. No evidence of pulmonary embolus. 3. Minimal bibasilar atelectasis noted; lungs otherwise clear.   Electronically Signed   By: Roanna Raider M.D.   On: 10/31/2014 03:47   Mr Laqueta Jean XB Contrast  10/31/2014   CLINICAL DATA:  34 year old male with history of IVDU, now with acute inability to flex/extend left upper extremity with loss of sensation. Concern for possible septic emboli.  EXAM: MRI HEAD WITHOUT AND WITH CONTRAST  TECHNIQUE: Multiplanar, multiecho pulse sequences of the brain and surrounding structures were obtained without and with intravenous contrast.  CONTRAST:  15mL MULTIHANCE GADOBENATE DIMEGLUMINE 529 MG/ML IV SOLN  COMPARISON:  Prior head CT performed earlier on the same day.  FINDINGS: The CSF containing spaces are within normal limits for patient age. No focal parenchymal signal abnormality is identified. No mass lesion, midline shift, or extra-axial fluid collection. Ventricles are normal in size without evidence of hydrocephalus.  No diffusion-weighted signal abnormality is identified to suggest acute intracranial infarct. Gray-white matter differentiation is maintained. Normal flow voids are seen within the intracranial vasculature. No intracranial hemorrhage  identified.  No abnormal enhancement identified.  The cervicomedullary junction is normal. Pituitary gland is within normal limits. Pituitary stalk is midline. The globes and optic nerves demonstrate a normal appearance with normal signal intensity.  The bone marrow signal intensity is normal. Calvarium is intact. Visualized upper cervical spine is within normal limits.  Scalp soft tissues are unremarkable.  Paranasal sinuses are clear.  No mastoid effusion.  IMPRESSION: Normal contrast enhanced MRI of the brain with no acute intracranial abnormality identified.   Electronically Signed   By: Rise Mu M.D.   On: 10/31/2014 06:45   Dg Chest Port 1 View  10/31/2014   CLINICAL DATA:  Chest pain and paralysis of the left hand.  EXAM: PORTABLE CHEST - 1 VIEW  COMPARISON:  02/24/2012  FINDINGS: The heart size and mediastinal contours are within normal limits. Both lungs are clear. The visualized skeletal structures are unremarkable.  IMPRESSION: Negative portable chest.  Electronically Signed   By: Marnee Spring M.D.   On: 10/31/2014 02:13     EKG Interpretation   Date/Time:  Tuesday October 31 2014 01:15:08 EST Ventricular Rate:  94 PR Interval:    QRS Duration: 84 QT Interval:  365 QTC Calculation: 456 R Axis:   74 Text Interpretation:  Normal sinus rhythm ST elev, probable normal early  repol pattern Confirmed by Zeno Hickel  MD, Tashawn Greff ([id number]) on 10/31/2014 2:05:05 AM      MDM   Final diagnoses:  Paralysis of left hand  Chest pain  IVDU (intravenous drug user)  Neuropathy of left upper extremity  Calcaneus fracture, unspecified laterality, sequela    34 year old male with acute onset of chest pain, shortness of breath and numbness and tingling from left elbow down, associated with weakness.  Patient with recent calcaneal surgery, also has strong history of IV drug use with frequent abscesses.  Patient is afebrile, speaking in full sentences.  Not tachycardic.  He is not dyspneic.  Given  his complaints, however.  Differential includes septic emboli, pulmonary emboli of the lungs.  Dissection is less likely although he does have different blood pressures in both arms.  Left arm weakness.  Differential includes a brachial plexus injury such as Saturday palsy, especially worse recent crutch use, but cannot explain acute onset of symptoms as well as possible septic emboli to brain.  Plan for labs, chest x-ray, eventual CT angio chest, head CT.  I personally performed the services described in this documentation, which was scribed in my presence. The recorded information has been reviewed and is accurate.    4:49 AM No signs of PE, dissection, acute bleed of brain.  Labs essentially normal.  D/w Dr Hosie Poisson, who feels sxs may be brachial plexus injury, but with h/o IVDU, would recommend MRI brain for possible emboli.    5:29 AM Pt lives alone, uses crutches for ambulation.  As patient has lost function of his left arm, expect he would need admission for placement in rehab facility until heel or arm has recovered.  Await MRI.  Olivia Mackie, MD 10/31/14 0530  Olivia Mackie, MD 10/31/14 [phone]

## 2014-10-31 NOTE — Clinical Social Work Note (Signed)
Clinical Social Worker has obtained attorney contact information from patient and pt's mother, Lady Saucierenny Biello and successfully faxed a letter to pt's attorney, [employer] on pt's behalf indicating pt was admitted on this day.  CSW provided pt with original copy of letter.    Clinical Social Worker will sign off for now as social work intervention is no longer needed. Please consult us again if new need arises.  Derenda FennelBashira Rachid Parham, MSW, LCSWA [phone] 10/31/2014 11:28 AM

## 2014-10-31 NOTE — ED Notes (Signed)
Pt aware of urine sample needed. Pt unable to go at this time. 

## 2014-10-31 NOTE — ED Notes (Signed)
Patient remains in MRI 

## 2014-10-31 NOTE — Clinical Social Work Psychosocial (Signed)
Clinical Social Work Department BRIEF PSYCHOSOCIAL ASSESSMENT 10/31/2014  Patient:  Kenneth Charles, Kenneth Charles     Account Number:  [id number]     Admit date:  10/31/2014  Clinical Social Worker:  Glendon Axe, CLINICAL SOCIAL WORKER  Date/Time:  10/31/2014 03:47 PM  Referred by:  Physician  Date Referred:  10/31/2014 Referred for  SNF Placement   Other Referral:  Interview type:  Patient Other interview type:  N/A  PSYCHOSOCIAL DATA Living Status:  ALONE Admitted from facility:  N/A Level of care:  N/A Primary support name:  Davaun Quintela Primary support relationship to patient:  PARENT Degree of support available:   Strong    CURRENT CONCERNS Current Concerns  Post-Acute Placement   Other Concerns:  N/A  SOCIAL WORK ASSESSMENT / PLAN Clinical Social Worker met with patient several times today in reference to providing pt's attorney a letter indicating he has been hospitalized and post-acute placement for SNF. CSW obtained attorney contact information from pt's mother and faxed letter to law office. CSW also provided pt with original copy of letter. CSW explained SNF process and informed pt that he could possibly be placed outside of [facility] due to being uninsured. Pt reported he has completed a Insurance underwriter with financial counselor during last admission. Pt further reported he is agreeable to SNF placement because he lives alone and cannot dress himself. CSW asked pt when was the last time he used heroin/cocaine and pt reported 1 week ago for cocaine. CSW reported drug use will be a barrier with placement as well. Pt expressed understanding. CSW will continue to follow pt and pt's family for PT/OT recommendations and disposition plan.   Assessment/plan status:  Psychosocial Support/Ongoing Assessment of Needs Other assessment/ plan:   Awaiting PT/OT evaluations/recommendations.   Information/referral to community resources:   SNF information.     PATIENT'S/FAMILY'S RESPONSE TO PLAN OF CARE: Pt lying in bed alert and oriented. Pt reported he used cocaine 1 week ago. Pt stated he is agreeable to SNF placement because he cannot care for himself at home. Pt noticeably frustrated with barriers for SNF placement and makes reference to his foot stating he cannot walk. Pt appreciated social work intervention and reported he will review SNF's.     Glendon Axe, MSW, LCSWA [phone] 10/31/2014 4:08 PM

## 2014-11-01 ENCOUNTER — Encounter (HOSPITAL_COMMUNITY): Payer: Self-pay | Admitting: Neurology

## 2014-11-01 DIAGNOSIS — R079 Chest pain, unspecified: Secondary | ICD-10-CM

## 2014-11-01 DIAGNOSIS — S92002A Unspecified fracture of left calcaneus, initial encounter for closed fracture: Secondary | ICD-10-CM

## 2014-11-01 DIAGNOSIS — R29898 Other symptoms and signs involving the musculoskeletal system: Secondary | ICD-10-CM

## 2014-11-01 DIAGNOSIS — Z720 Tobacco use: Secondary | ICD-10-CM

## 2014-11-01 LAB — URINE CULTURE
Colony Count: NO GROWTH
Culture: NO GROWTH

## 2014-11-01 LAB — CBC
HCT: 41.4 % (ref 39.0–52.0)
Hemoglobin: 13.6 g/dL (ref 13.0–17.0)
MCH: 30.1 pg (ref 26.0–34.0)
MCHC: 32.9 g/dL (ref 30.0–36.0)
MCV: 91.6 fL (ref 78.0–100.0)
Platelets: 378 10*3/uL (ref 150–400)
RBC: 4.52 MIL/uL (ref 4.22–5.81)
RDW: 13.5 % (ref 11.5–15.5)
WBC: 8.3 10*3/uL (ref 4.0–10.5)

## 2014-11-01 MED ORDER — NICOTINE 14 MG/24HR TD PT24
14.0000 mg | MEDICATED_PATCH | Freq: Every day | TRANSDERMAL | Status: DC
Start: 2014-11-01 — End: 2014-11-02
  Administered 2014-11-01 – 2014-11-02 (×2): 14 mg via TRANSDERMAL
  Filled 2014-11-01 (×2): qty 1

## 2014-11-01 NOTE — Progress Notes (Signed)
Physical Therapy Treatment Patient Details Name: Samara DeistChristopher B Schlag MRN: [id number] DOB: 06-27-1981 Today's Date: 11/01/2014    History of Present Illness Adm due to weakness LUE (pt with fall off deck ~4 weeks ago; s/p Lt calcaneal ORIF-NWB) PMHx- polysubstance abuse (+for cocaine on adm)    PT Comments    Pt became very agitated when attempting to perform PROM/AAROM with Lt UE. At length discussion regarding equipment needs with pt. Pt stated " if i go home, i will just lay in my pee". Recommend Lt platform RW for mobility and drop arm commode. Will cont to follow per POC.   Follow Up Recommendations  Other (comment) (spoke to CM; [facility] )     Equipment Recommendations  Rolling walker with 5" wheels;Other (comment);3in1 (PT) (Lt platform RW ; drop arm commode )    Recommendations for Other Services       Precautions / Restrictions Precautions Precautions: Fall Precaution Comments: pt keeping Lt arm flexed throughout session  Restrictions Weight Bearing Restrictions: Yes LLE Weight Bearing: Non weight bearing    Mobility  Bed Mobility Overal bed mobility: Independent             General bed mobility comments: pt exiting out on Rt side of bed; was able to exit without use of Lt UE   Transfers Overall transfer level: Needs assistance Equipment used: Rolling walker (2 wheeled) Transfers: Sit to/from Stand Sit to Stand: Min guard         General transfer comment: pt able to stand without phyiscal (A); RW placed in front of pt; pt reaching for Rt UE support when achieved standing to beging ambulating   Ambulation/Gait Ambulation/Gait assistance: Min guard Ambulation Distance (Feet): 14 Feet Assistive device: Rolling walker (2 wheeled) Gait Pattern/deviations: Step-to pattern Gait velocity: impulsively fast   General Gait Details: cues for safety with RW; pt keeping Lt bicep flexed and using on Rt UE and LE to manage RW and hop  around bed    Stairs            Wheelchair Mobility    Modified Rankin (Stroke Patients Only)       Balance Overall balance assessment: Needs assistance Sitting-balance support: Feet supported;No upper extremity supported Sitting balance-Leahy Scale: Normal     Standing balance support: During functional activity;Single extremity supported Standing balance-Leahy Scale: Fair Standing balance comment: stood for brief period of time without UE support                     Cognition Arousal/Alertness: Awake/alert Behavior During Therapy: Agitated Overall Cognitive Status: Within Functional Limits for tasks assessed                      Exercises      General Comments General comments (skin integrity, edema, etc.): attempted to perform PROM/AAROM on Lt UE; pt became very agitated       Pertinent Vitals/Pain Pain Assessment: 0-10 Pain Score: 8  Pain Location: lt heel  Pain Descriptors / Indicators: Sore Pain Intervention(s): Monitored during session;Premedicated before session;Repositioned;RN gave pain meds during session    Home Living                      Prior Function            PT Goals (current goals can now be found in the care plan section) Acute Rehab PT Goals Patient Stated Goal:  to not have to do all this  PT Goal Formulation: With patient Time For Goal Achievement: 11/03/14 Potential to Achieve Goals: Good Progress towards PT goals: Progressing toward goals    Frequency  Min 4X/week    PT Plan Current plan remains appropriate    Co-evaluation             End of Session Equipment Utilized During Treatment: Gait belt Activity Tolerance: Patient limited by pain (pt agitated and refused further PT ) Patient left: in bed;with call bell/phone within reach;with bed alarm set     Time: 1478-2956 PT Time Calculation (min) (ACUTE ONLY): 10 min  Charges:  $Gait Training: 8-22 mins                    G Codes:   Functional Assessment Tool Used: clinical judgement Functional Limitation: Mobility: Walking and moving around Mobility: Walking and Moving Around Current Status [phone]): At least 1 percent but less than 20 percent impaired, limited or restricted Mobility: Walking and Moving Around Goal Status [phone]): At least 1 percent but less than 20 percent impaired, limited or restricted   Donell Sievert, [place]  [phone] 11/01/2014, 4:25 PM

## 2014-11-01 NOTE — Care Management Note (Signed)
    Page 1 of 2   11/02/2014     3:23:27 PM CARE MANAGEMENT NOTE 11/02/2014  Patient:  Kenneth Charles, Kenneth Charles   Account Number:  [id number]  Date Initiated:  10/31/2014  Documentation initiated by:  Lorne Skeens  Subjective/Objective Assessment:   Patient was admitted with Left arm weakness, chest pain and shortness of breath.     Action/Plan:   Will follow for discharge needs pending PT/OT evals and physician orders.   Anticipated DC Date:  11/02/2014   Anticipated DC Plan:  HOME/SELF CARE      DC Planning Services  CM consult  OP Neuro Rehab      Choice offered to / List presented to:  C-1 Patient   DME arranged  3-N-1  [place]      DME agency  [place].        Status of service:   Medicare Important Message given?   (If response is "NO", the following Medicare IM given date fields will be blank) Date Medicare IM given:   Medicare IM given by:   Date Additional Medicare IM given:   Additional Medicare IM given by:    Discharge Disposition:  HOME/SELF CARE  Per UR Regulation:  Reviewed for med. necessity/level of care/duration of stay  If discussed at [place] of Stay Meetings, dates discussed:    Comments:  11/02/14 Lorne Skeens RN, MSN, CM- [facility] DME contacted regarding equipment needs. Patient states he does not have any money to pay for equipment, so [facility] to discuss charity application.  Patient is in agreement with outpatient neuro rehab. Written information was provided and referral was faxed to [facility].   11/01/14 [place] RN, MSN, CM- Per conversation with CSW, patient will not be able to discharge to a SNF. CM met with patient to discuss probable discharge home. Patient does not have a qualifying diagnosis for home health services, so a private duty agency list was provided. Patient states that he has no one to help him and he is unable to feed or toilet himself.  CM spoke with PT to request  recommendations for home DME.  Caryl Pina with financial counseling notifed of patient's request to speak with her.

## 2014-11-01 NOTE — Progress Notes (Signed)
Subjective:  Patient was seen and examined this morning. Patient continues to have left arm weakness with decreased sensation without improvement or worsening. Patient denies pain in left arm. Pain in left heel is controlled with pain medication. He denies associated symptoms.   Objective: Vital signs in last 24 hours: Filed Vitals:   10/31/14 2142 11/01/14 0138 11/01/14 0531 11/01/14 0920  BP: 121/68 122/74 107/61 110/81  Pulse: 92 82 85 90  Temp: 98.6 F (37 C) 97.8 F (36.6 C) 98 F (36.7 C) 97.9 F (36.6 C)  TempSrc: Oral Axillary Oral Oral  Resp: 18 16 18 17   Height:      Weight:      SpO2: 97% 99% 97% 98%   Weight change:   Intake/Output Summary (Last 24 hours) at 11/01/14 1047 Last data filed at 11/01/14 0239  Gross per 24 hour  Intake    360 ml  Output    450 ml  Net    -90 ml   General: Vital signs reviewed.  Patient is well-developed and well-nourished, in no acute distress and cooperative with exam.  Neck: No pain on palpation of spine, no masses. Cardiovascular: RRR, no murmurs, gallops, or rubs. Pulmonary/Chest: Clear to auscultation bilaterally, no wheezes, rales, or rhonchi. Abdominal: Soft, non-tender, non-distended, BS + Extremities: Fasciculations present. 1/5 grip strength in LUE, 2/5 strength with flexion and extension on LUE. Decreased ROM. Decreased sensation in left upper extremity. Normal left radial pulse. No lower extremity edema bilaterally, left foot s/p surgery, surgical wound C/D/I, pulses symmetric and intact bilaterally.  Skin: Warm, dry and intact. Psychiatric: Normal mood and affect. speech and behavior is normal. Cognition and memory are normal.   Lab Results: Basic Metabolic Panel:  Recent Labs Lab 10/31/14 0158  NA 138  K 3.7  CL 102  CO2 26  GLUCOSE 101*  BUN 13  CREATININE 0.93  CALCIUM 9.7   Liver Function Tests:  Recent Labs Lab 10/31/14 0158  AST 23  ALT 24  ALKPHOS 94  BILITOT 0.5  PROT 7.2  ALBUMIN 3.8     Recent Labs Lab 10/31/14 0158  LIPASE 35   CBC:  Recent Labs Lab 10/31/14 0158 11/01/14 0804  WBC 12.1* 8.3  NEUTROABS 7.3  --   HGB 13.8 13.6  HCT 41.3 41.4  MCV 89.8 91.6  PLT 411* 378   Cardiac Enzymes:  Recent Labs Lab 10/31/14 0445 10/31/14 1400 10/31/14 1848  TROPONINI <0.03 <0.03 <0.03   Coagulation:  Recent Labs Lab 10/31/14 0158  LABPROT 13.4  INR 1.00   Urine Drug Screen: Drugs of Abuse     Component Value Date/Time   LABOPIA NONE DETECTED 10/31/2014 0527   COCAINSCRNUR POSITIVE* 10/31/2014 0527   LABBENZ NONE DETECTED 10/31/2014 0527   AMPHETMU NONE DETECTED 10/31/2014 0527   THCU NONE DETECTED 10/31/2014 0527   LABBARB NONE DETECTED 10/31/2014 0527    Urinalysis:  Recent Labs Lab 10/31/14 0527  COLORURINE YELLOW  LABSPEC 1.030  PHURINE 5.0  GLUCOSEU NEGATIVE  HGBUR NEGATIVE  BILIRUBINUR NEGATIVE  KETONESUR NEGATIVE  PROTEINUR NEGATIVE  UROBILINOGEN 0.2  NITRITE NEGATIVE  LEUKOCYTESUR NEGATIVE    Micro Results: Recent Results (from the past 240 hour(s))  Culture, blood (routine x 2)     Status: None (Preliminary result)   Collection Time: 10/31/14  1:58 AM  Result Value Ref Range Status   Specimen Description BLOOD RIGHT HAND  Final   Special Requests BOTTLES DRAWN AEROBIC AND ANAEROBIC 5CC EA  Final  Culture   Final           BLOOD CULTURE RECEIVED NO GROWTH TO DATE CULTURE WILL BE HELD FOR 5 DAYS BEFORE ISSUING A FINAL NEGATIVE REPORT Performed at [facility]    Report Status PENDING  Incomplete  Culture, blood (routine x 2)     Status: None (Preliminary result)   Collection Time: 10/31/14  2:00 AM  Result Value Ref Range Status   Specimen Description BLOOD LEFT HAND  Final   Special Requests BOTTLES DRAWN AEROBIC AND ANAEROBIC 1CC EA  Final   Culture   Final           BLOOD CULTURE RECEIVED NO GROWTH TO DATE CULTURE WILL BE HELD FOR 5 DAYS BEFORE ISSUING A FINAL NEGATIVE REPORT Note: Culture results may  be compromised due to an excessive volume of blood received in culture bottles. Performed at [facility]    Report Status PENDING  Incomplete  Urine culture     Status: None   Collection Time: 10/31/14  5:27 AM  Result Value Ref Range Status   Specimen Description URINE, CLEAN CATCH  Final   Special Requests NONE  Final   Colony Count NO GROWTH Performed at [facility]   Final   Culture NO GROWTH Performed at [facility]   Final   Report Status 11/01/2014 FINAL  Final   Studies/Results: Dg Forearm Left  10/31/2014   CLINICAL DATA:  Left arm weakness and numbness  EXAM: LEFT FOREARM - 2 VIEW  COMPARISON:  None.  FINDINGS: There is no evidence of fracture or other focal bone lesions. Soft tissues are unremarkable.  IMPRESSION: No acute abnormality noted.   Electronically Signed   By: Alcide Clever M.D.   On: 10/31/2014 09:07   Ct Head Wo Contrast  10/31/2014   CLINICAL DATA:  Acute onset of left arm numbness and tingling, with difference in systolic blood pressure between the arms. Initial encounter.  EXAM: CT HEAD WITHOUT CONTRAST  TECHNIQUE: Contiguous axial images were obtained from the base of the skull through the vertex without intravenous contrast.  COMPARISON:  None.  FINDINGS: There is no evidence of acute infarction, mass lesion, or intra- or extra-axial hemorrhage on CT.  The posterior fossa, including the cerebellum, brainstem and fourth ventricle, is within normal limits. The third and lateral ventricles, and basal ganglia are unremarkable in appearance. The cerebral hemispheres are symmetric in appearance, with normal gray-white differentiation. No mass effect or midline shift is seen.  There is no evidence of fracture; visualized osseous structures are unremarkable in appearance. The visualized portions of the orbits are within normal limits. The paranasal sinuses and mastoid air cells are well-aerated. No significant soft tissue abnormalities are seen.   IMPRESSION: Unremarkable noncontrast CT of the head.   Electronically Signed   By: Roanna Raider M.D.   On: 10/31/2014 03:43   Ct Angio Chest Pe W/cm &/or Wo Cm  10/31/2014   CLINICAL DATA:  Acute onset of epigastric abdominal pain, left arm numbness and tingling, and difference in systolic blood pressure between the arms. Left arm tingling and numbness worsens with deep breaths. Initial encounter.  EXAM: CT ANGIOGRAPHY CHEST WITH CONTRAST  TECHNIQUE: Multidetector CT imaging of the chest was performed using the standard protocol during bolus administration of intravenous contrast. Multiplanar CT image reconstructions and MIPs were obtained to evaluate the vascular anatomy.  CONTRAST:  80mL OMNIPAQUE IOHEXOL 350 MG/ML SOLN  COMPARISON:  None.  FINDINGS: There  is no evidence of aortic dissection. There is no evidence of aneurysmal dilatation. No calcific atherosclerotic disease is seen. The great vessels are patent and unremarkable in appearance.  There is no evidence of pulmonary embolus.  Minimal bibasilar atelectasis is noted. The lungs are otherwise clear. There is no evidence of significant focal consolidation, pleural effusion or pneumothorax. No masses are identified; no abnormal focal contrast enhancement is seen.  The mediastinum is unremarkable in appearance. No mediastinal lymphadenopathy is seen. No pericardial effusion is identified. No axillary lymphadenopathy is seen. The visualized portions of the thyroid gland are unremarkable in appearance.  The visualized portions of the liver and spleen are unremarkable.  No acute osseous abnormalities are seen.  Review of the MIP images confirms the above findings.  IMPRESSION: 1. No evidence of aortic dissection. No evidence of aneurysmal dilatation. No calcific atherosclerotic disease seen. Great vessels patent and unremarkable in appearance. 2. No evidence of pulmonary embolus. 3. Minimal bibasilar atelectasis noted; lungs otherwise clear.   Electronically  Signed   By: Roanna RaiderJeffery  Chang M.D.   On: 10/31/2014 03:47   Mr Laqueta JeanBrain W NWWo Contrast  10/31/2014   CLINICAL DATA:  34 year old male with history of IVDU, now with acute inability to flex/extend left upper extremity with loss of sensation. Concern for possible septic emboli.  EXAM: MRI HEAD WITHOUT AND WITH CONTRAST  TECHNIQUE: Multiplanar, multiecho pulse sequences of the brain and surrounding structures were obtained without and with intravenous contrast.  CONTRAST:  15mL MULTIHANCE GADOBENATE DIMEGLUMINE 529 MG/ML IV SOLN  COMPARISON:  Prior head CT performed earlier on the same day.  FINDINGS: The CSF containing spaces are within normal limits for patient age. No focal parenchymal signal abnormality is identified. No mass lesion, midline shift, or extra-axial fluid collection. Ventricles are normal in size without evidence of hydrocephalus.  No diffusion-weighted signal abnormality is identified to suggest acute intracranial infarct. Gray-white matter differentiation is maintained. Normal flow voids are seen within the intracranial vasculature. No intracranial hemorrhage identified.  No abnormal enhancement identified.  The cervicomedullary junction is normal. Pituitary gland is within normal limits. Pituitary stalk is midline. The globes and optic nerves demonstrate a normal appearance with normal signal intensity.  The bone marrow signal intensity is normal. Calvarium is intact. Visualized upper cervical spine is within normal limits.  Scalp soft tissues are unremarkable.  Paranasal sinuses are clear.  No mastoid effusion.  IMPRESSION: Normal contrast enhanced MRI of the brain with no acute intracranial abnormality identified.   Electronically Signed   By: Rise MuBenjamin  McClintock M.D.   On: 10/31/2014 06:45   Dg Hand 2 View Left  10/31/2014   CLINICAL DATA:  Left hand weakness and numbness  EXAM: LEFT HAND - 2 VIEW  COMPARISON:  None.  FINDINGS: There is no evidence of fracture or dislocation. There is no evidence  of arthropathy or other focal bone abnormality. Soft tissues are unremarkable.  IMPRESSION: No acute abnormality noted.   Electronically Signed   By: Alcide CleverMark  Lukens M.D.   On: 10/31/2014 09:08   Dg Chest Port 1 View  10/31/2014   CLINICAL DATA:  Chest pain and paralysis of the left hand.  EXAM: PORTABLE CHEST - 1 VIEW  COMPARISON:  02/24/2012  FINDINGS: The heart size and mediastinal contours are within normal limits. Both lungs are clear. The visualized skeletal structures are unremarkable.  IMPRESSION: Negative portable chest.   Electronically Signed   By: Marnee SpringJonathon  Watts M.D.   On: 10/31/2014 02:13   Medications:  I have reviewed the patient's current medications. Prior to Admission:  Prescriptions prior to admission  Medication Sig Dispense Refill Last Dose  . Oxycodone HCl 10 MG TABS Take 1 tablet by mouth every 6 (six) hours as needed (for pain).    10/30/2014 at Unknown time  . dicyclomine (BENTYL) 20 MG tablet Take 1 tablet (20 mg total) by mouth 2 (two) times daily. (Patient not taking: Reported on 10/31/2014) 20 tablet 0 Not Taking at Unknown time  . diphenoxylate-atropine (LOMOTIL) 2.5-0.025 MG per tablet Take 2 tablets by mouth 4 (four) times daily as needed for diarrhea or loose stools. (Patient not taking: Reported on 10/31/2014) 30 tablet 0 Not Taking at Unknown time  . loperamide (IMODIUM) 2 MG capsule Take 1 capsule (2 mg total) by mouth 4 (four) times daily as needed for diarrhea or loose stools. (Patient not taking: Reported on 10/31/2014) 12 capsule 0 Not Taking at Unknown time  . ondansetron (ZOFRAN ODT) 4 MG disintegrating tablet  ODT q4 hours prn nausea/vomit (Patient not taking: Reported on 10/31/2014) 15 tablet 0 Not Taking at Unknown time   Scheduled Meds: . enoxaparin (LOVENOX) injection  40 mg Subcutaneous Daily   Continuous Infusions:  PRN Meds:.albuterol, oxyCODONE Assessment/Plan: Principal Problem:   Left arm weakness Active Problems:   Current tobacco use   Calcaneal  fracture   Polydrug dependence including opioid type drug, episodic abuse   Atypical chest pain   Calcaneus fracture   Chest pain  Acute Left Arm Weakness: Patient continues to have left arm weakness with decreased sensation, now with fasciculations of LUE. MRI brain normal, Xrays normal. This could be indicative of lower motor neuron involvement. Possible upper motor neuron involvement with slowness and stiffness. Differentials include cervical radiculomyopathy, multifocal motor neuropathy (although this is normally progressive), ALS (however normally progressive and not sudden onset) or a nerve palsy. Patient would benefit from outpatient neurology follow up with possible EMG studies. He may need SNF placement since patient is unable to care for himself at home.  -PT/OT -SW for placement -MRI cervical spine and left shoulder -Consider consultation with neurology -Outpatient neurology follow up with nerve conduction studies   Atypical Chest Pain: Resolved. Troponins negative x 3 and EKG normal sinus rhythm. Possibly secondary to cocaine induces vasospasm as UDS was positive for cocaine versus GI etiology.  -Monitor  IVDU: UDS positive for cocaine. Also with history of heroin use one year ago. -Counsel on abstinence   Left Calcaneal Fracture: Status post ORIF done at [facility] 4 weeks ago. Surgical incision site is C/D/I. Pain controlled with medication.  -Oxycodone 10 mg Q6H prn   Code Status: FULL CODE  F/E/N: Regular diet   VTE Ppx: Lovenox SQ  Dispo: Disposition is deferred at this time, awaiting improvement of current medical problems.  Anticipated discharge in approximately 1-2 day(s).   The patient does not have a current PCP (Pcp Not In System) and does need an [facility] hospital follow-up appointment after discharge.  The patient does not have transportation limitations that hinder transportation to clinic appointments.  .Services Needed at time of discharge: Y = Yes,  Blank = No PT:   OT:   RN:   Equipment:   Other:       Jill Alexanders, DO PGY-1 Internal Medicine Resident Pager # [phone] 11/01/2014 10:47 AM

## 2014-11-01 NOTE — Evaluation (Signed)
Occupational Therapy Evaluation Patient Details Name: Kenneth Charles MRN: [id number] DOB: November 16, 1980 Today's Date: 11/01/2014    History of Present Illness Adm due to weakness LUE (pt with fall off deck ~4 weeks ago; s/p Lt calcaneal ORIF-NWB) PMHx- polysubstance abuse (+for cocaine on adm)   Clinical Impression   Pt admitted with the above diagnoses and presents with below problem list. Pt will benefit from continued acute OT to address the below listed deficits and maximize independence with basic ADLs prior to d/c to next venue. PTA pt was mod I for ADLs using crutches after Lt calcaneous surgery. Pt currently at min A level for most ADLs. Pt needing min A this session to ambulate with rw to/from bathroom with pt report he is unable to use LUE on rw. Pt with inconsistent LUE ROM and strength during functional activity and MMT. MMT 3-3+/5 shoulder flexion, 3-3+/5 biceps, 2-2+/5 triceps. During AAROM minimal spontaneous finger composite extension and supination/pronation noted to be 2+/5.     Follow Up Recommendations  SNF;Supervision/Assistance - 24 hour    Equipment Recommendations  Other (comment) (defer to next venue/TBD)    Recommendations for Other Services       Precautions / Restrictions Precautions Precautions: Fall Restrictions Weight Bearing Restrictions: Yes LLE Weight Bearing: Non weight bearing      Mobility Bed Mobility Overal bed mobility: Modified Independent             General bed mobility comments: supine>EOB and scooted forward using RUE only  Transfers Overall transfer level: Needs assistance Equipment used: Rolling walker (2 wheeled) Transfers: Sit to/from Stand Sit to Stand: Min guard         General transfer comment: pt stood without assist; min A needed for balance during ambulation to bathroom    Balance Overall balance assessment: Needs assistance         Standing balance support: Single extremity supported;During  functional activity Standing balance-Leahy Scale: Poor Standing balance comment: NWB LLE; pt needing support for balance in standing                            ADL Overall ADL's : Needs assistance/impaired Eating/Feeding: Set up;Sitting (using nondominant hand)   Grooming: Set up;Oral care;Wash/dry face;Sitting Grooming Details (indicate cue type and reason): using nondominant hand Upper Body Bathing: Minimal assitance;Sitting;   Lower Body Bathing: Minimal assistance;Sit to/from stand   Upper Body Dressing : Minimal assistance;Sitting   Lower Body Dressing: Minimal assistance;Sit to/from stand   Toilet Transfer: Minimal assistance;Ambulation;RW   Toileting- Clothing Manipulation and Hygiene: Minimal assistance;Sit to/from stand   Tub/ Shower Transfer: Minimal assistance;Ambulation;Shower seat;Rolling walker   Functional mobility during ADLs: Minimal assistance;Rolling walker General ADL Comments: Min A for balance in standing impacting level of assist with LB ADLs and transfers. Pt noted to keep LUE off rw and LLE NWB during ambulation using only RUE to support self with rw. Pt stated he cannot hold the rw with his LUE. Pt completed grooming tasks sitting at sink using only his right dominant hand. When pt encouraged to bring LUE and place on sink pt noted to have some AROM at shoulder level and supination to place LUE on sink in a supinated position.     Vision     Perception     Praxis      Pertinent Vitals/Pain Pain Assessment: 0-10 Pain Score: 8  Pain Location: Lt heel Pain Intervention(s): Limited activity within patient's tolerance;Monitored during  session     Hand Dominance Left   Extremity/Trunk Assessment Upper Extremity Assessment Upper Extremity Assessment: LUE deficits/detail LUE Deficits / Details: Inconsistent findings during assessment of LUE. During functional activity with encouragement pt placed LUE onto sink in seated position with some  Lt shoulder flexion and supination AROM noted. During AAROM pt with minimal spontaneous finger composite extenstion and supination/pronation 2+. During MMT of Lt arm pt presents with 3-3+/5 shoulder flexion, 3-3+/5 biceps, inconsistent tricep strength 2-2+/5 LUE Sensation: decreased light touch (forearem and distal) LUE Coordination: decreased fine motor;decreased gross motor   Lower Extremity Assessment Lower Extremity Assessment: Defer to PT evaluation   Cervical / Trunk Assessment Cervical / Trunk Assessment: Normal   Communication Communication Communication: No difficulties   Cognition Arousal/Alertness: Awake/alert Behavior During Therapy: Agitated Overall Cognitive Status: Within Functional Limits for tasks assessed                     General Comments       Exercises Exercises: Other exercises: Lt shoulder flexion/extension, 10 reps, AAROM, supine; Instructed on AAROM self range exercises.     Shoulder Instructions      Home Living Family/patient expects to be discharged to:: Private residence Living Arrangements: Alone Available Help at Discharge: Other (Comment) (pt stated he does not have anyone to assist at d/c) Type of Home: Apartment Home Access: Stairs to enter Entrance Stairs-Number of Steps: 1 Entrance Stairs-Rails: None Home Layout: One level     Bathroom Shower/Tub: Tub/shower unit         Home Equipment: Crutches          Prior Functioning/Environment Level of Independence: Independent with assistive device(s)        Comments: crutches after Lt calcaneal surgery    OT Diagnosis: Acute pain   OT Problem List: Decreased strength;Decreased range of motion;Decreased activity tolerance;Impaired balance (sitting and/or standing);Decreased safety awareness;Decreased knowledge of use of DME or AE;Decreased knowledge of precautions;Impaired sensation;Impaired UE functional use;Pain   OT Treatment/Interventions: Self-care/ADL  training;Therapeutic exercise;DME and/or AE instruction;Therapeutic activities;Patient/family education;Balance training;Other (comment) (pt declined pursuing a splint for Lt wrist)    OT Goals(Current goals can be found in the care plan section) Acute Rehab OT Goals Patient Stated Goal: not stated OT Goal Formulation: With patient Time For Goal Achievement: 11/08/14 Potential to Achieve Goals: Good ADL Goals Pt Will Perform Grooming: with modified independence;sitting;standing (using LUE as gross assist) Pt Will Perform Upper Body Bathing: with modified independence;sitting Pt Will Perform Lower Body Bathing: with modified independence;with adaptive equipment;sit to/from stand Pt Will Perform Upper Body Dressing: with modified independence;sitting Pt Will Perform Lower Body Dressing: with modified independence;with adaptive equipment;sit to/from stand Pt Will Perform Tub/Shower Transfer: Shower transfer;with modified independence;shower seat;rolling walker Pt/caregiver will Perform Home Exercise Program: Increased ROM;Increased strength;Left upper extremity;Independently;With written HEP provided  OT Frequency: Min 2X/week   Barriers to D/C: Decreased caregiver support          Co-evaluation              End of Session Equipment Utilized During Treatment: Rolling walker;Other (comment) (pt refused gait belt)  Activity Tolerance: Patient tolerated treatment well Patient left: in bed;with call bell/phone within reach;with chair alarm set;Other (comment) (no SCD's on bed)   Time: 1610-9604 OT Time Calculation (min): 26 min Charges:  OT General Charges $OT Visit: 1 Procedure OT Evaluation $Initial OT Evaluation Tier I: 1 Procedure OT Treatments $Self Care/Home Management : 8-22 mins G-Codes: OT G-codes **NOT FOR INPATIENT  CLASS** Functional Assessment Tool Used: clinical judgement Functional Limitation: Self care Self Care Current Status ([id number]): At least 1 percent but less  than 20 percent impaired, limited or restricted Self Care Goal Status ([id number]([id number]): 0 percent impaired, limited or restricted  Pilar GrammesMathews, Kendle Erker H 11/01/2014, 11:47 AM

## 2014-11-01 NOTE — Progress Notes (Signed)
UR completed 

## 2014-11-01 NOTE — Consult Note (Signed)
NEURO HOSPITALIST CONSULT NOTE    Reason for Consult: left wrist drop  HPI:                                                                                                                                          Kenneth Charles is an 34 y.o. male who was initially brought to hospital after awaking and feeling significant CP. He states he was doing well the night prior to admission and was watching TV.  He went to sleep in his bed with his hands laying across his chest. At 11 he awoke with CP and noted he could not use his left arm and hand. Patient was brought to hospital where his chest pain was evaluated with negative troponin's and normal ECG. MRI brain, Xray of left hand and forearm obtained to evaluate for cause of left arm and wrist drop. All were negative. Neurology was asked to evaluate left wrist drop.  Currently MRI left shoulder and C-spine pending.    Patient denies any trauma to his left humerus or ar, denies falling asleep in any abnormal position and states he was only asleep for about 3 hours.   Past Medical History  Diagnosis Date  . Asthma   . Anxiety   . Heroin addiction     Past Surgical History  Procedure Laterality Date  . Orif calcaneal fracture Left 10/05/2014    Family History  Problem Relation Age of Onset  . Hypertension Father   . Hypertension Mother     Social History:  reports that he has been smoking Cigarettes.  He has been smoking about 0.50 packs per day. He uses smokeless tobacco. He reports that he uses illicit drugs. He reports that he does not drink alcohol.  No Known Allergies  MEDICATIONS:                                                                                                                     Prior to Admission:  Prescriptions prior to admission  Medication Sig Dispense Refill Last Dose  . Oxycodone HCl 10 MG TABS Take 1 tablet by mouth every 6 (six) hours as needed (for pain).    10/30/2014 at Unknown  time  . dicyclomine (BENTYL) 20 MG tablet Take 1 tablet (  20 mg total) by mouth 2 (two) times daily. (Patient not taking: Reported on 10/31/2014) 20 tablet 0 Not Taking at Unknown time  . diphenoxylate-atropine (LOMOTIL) 2.5-0.025 MG per tablet Take 2 tablets by mouth 4 (four) times daily as needed for diarrhea or loose stools. (Patient not taking: Reported on 10/31/2014) 30 tablet 0 Not Taking at Unknown time  . loperamide (IMODIUM) 2 MG capsule Take 1 capsule (2 mg total) by mouth 4 (four) times daily as needed for diarrhea or loose stools. (Patient not taking: Reported on 10/31/2014) 12 capsule 0 Not Taking at Unknown time  . ondansetron (ZOFRAN ODT) 4 MG disintegrating tablet 4mg  ODT q4 hours prn nausea/vomit (Patient not taking: Reported on 10/31/2014) 15 tablet 0 Not Taking at Unknown time   Scheduled: . enoxaparin (LOVENOX) injection  40 mg Subcutaneous Daily     ROS:                                                                                                                                       History obtained from the patient  General ROS: negative for - chills, fatigue, fever, night sweats, weight gain or weight loss Psychological ROS: negative for - behavioral disorder, hallucinations, memory difficulties, mood swings or suicidal ideation Ophthalmic ROS: negative for - blurry vision, double vision, eye pain or loss of vision ENT ROS: negative for - epistaxis, nasal discharge, oral lesions, sore throat, tinnitus or vertigo Allergy and Immunology ROS: negative for - hives or itchy/watery eyes Hematological and Lymphatic ROS: negative for - bleeding problems, bruising or swollen lymph nodes Endocrine ROS: negative for - galactorrhea, hair pattern changes, polydipsia/polyuria or temperature intolerance Respiratory ROS: negative for - cough, hemoptysis, shortness of breath or wheezing Cardiovascular ROS: negative for - chest pain, dyspnea on exertion, edema or irregular  heartbeat Gastrointestinal ROS: negative for - abdominal pain, diarrhea, hematemesis, nausea/vomiting or stool incontinence Genito-Urinary ROS: negative for - dysuria, hematuria, incontinence or urinary frequency/urgency Musculoskeletal ROS: negative for - joint swelling or muscular weakness Neurological ROS: as noted in HPI Dermatological ROS: negative for rash and skin lesion changes   Blood pressure 110/81, pulse 90, temperature 97.9 F (36.6 C), temperature source Oral, resp. rate 17, height 6\' 3"  (1.905 m), weight 72.576 kg (160 lb), SpO2 98 %.   Neurologic Examination:                                                                                                      HEENT-  Normocephalic,  no lesions, without obvious abnormality.  Normal external eye and conjunctiva.  Normal TM's bilaterally.  Normal auditory canals and external ears. Normal external nose, mucus membranes and septum.  Normal pharynx. Cardiovascular- S1, S2 normal, pulses palpable throughout   Lungs- chest clear, no wheezing, rales, normal symmetric air entry Abdomen- normal findings: bowel sounds normal Extremities- no edema Lymph-no adenopathy palpable Musculoskeletal-no joint tenderness, deformity or swelling Skin-warm and dry, no hyperpigmentation, vitiligo, or suspicious lesions  Neurological Examination Mental Status: Alert, oriented, thought content appropriate.  Speech fluent without evidence of aphasia.  Able to follow 3 step commands without difficulty. Cranial Nerves: II: Discs flat bilaterally; Visual fields grossly normal, pupils equal, round, reactive to light and accommodation III,IV, VI: ptosis not present, extra-ocular motions intact bilaterally V,VII: smile symmetric, facial light touch sensation normal bilaterally VIII: hearing normal bilaterally IX,X: gag reflex present XI: bilateral shoulder shrug XII: midline tongue extension Motor: Right : Upper extremity   5/5    Left:     Upper  extremity   See note  Lower extremity   5/5     Lower extremity   5/5 --left shoulder abduction 5/5, left bicep 5/5, left triceps 0/5, left wrist extension 0/5, flexion 1/5, 3/5 finger flexion, finger extension 0/5. internal and external rotation 2/5 Tone and bulk:normal tone throughout; no atrophy noted Sensory: decreased sensation of left hand, dorsum of left forearm with normal sensation above left elbow Deep Tendon Reflexes: 2+ and symmetric throughout Plantars: Right: downgoing   Left: downgoing Cerebellar: normal finger-to-nose on the right and normal heel-to-shin test with right leg. Unable to assess on left UE due to weakness and LE secondary to recent surgery.  Gait: not tested due to multiple leads.       Lab Results: Basic Metabolic Panel:  Recent Labs Lab 10/31/14 0158  NA 138  K 3.7  CL 102  CO2 26  GLUCOSE 101*  BUN 13  CREATININE 0.93  CALCIUM 9.7    Liver Function Tests:  Recent Labs Lab 10/31/14 0158  AST 23  ALT 24  ALKPHOS 94  BILITOT 0.5  PROT 7.2  ALBUMIN 3.8    Recent Labs Lab 10/31/14 0158  LIPASE 35   No results for input(s): AMMONIA in the last 168 hours.  CBC:  Recent Labs Lab 10/31/14 0158 11/01/14 0804  WBC 12.1* 8.3  NEUTROABS 7.3  --   HGB 13.8 13.6  HCT 41.3 41.4  MCV 89.8 91.6  PLT 411* 378    Cardiac Enzymes:  Recent Labs Lab 10/31/14 0445 10/31/14 1400 10/31/14 1848  TROPONINI <0.03 <0.03 <0.03    Lipid Panel: No results for input(s): CHOL, TRIG, HDL, CHOLHDL, VLDL, LDLCALC in the last 168 hours.  CBG: No results for input(s): GLUCAP in the last 168 hours.  Microbiology: Results for orders placed or performed during the hospital encounter of 10/31/14  Culture, blood (routine x 2)     Status: None (Preliminary result)   Collection Time: 10/31/14  1:58 AM  Result Value Ref Range Status   Specimen Description BLOOD RIGHT HAND  Final   Special Requests BOTTLES DRAWN AEROBIC AND ANAEROBIC 5CC EA  Final    Culture   Final           BLOOD CULTURE RECEIVED NO GROWTH TO DATE CULTURE WILL BE HELD FOR 5 DAYS BEFORE ISSUING A FINAL NEGATIVE REPORT Performed at [facility]    Report Status PENDING  Incomplete  Culture, blood (routine x 2)  Status: None (Preliminary result)   Collection Time: 10/31/14  2:00 AM  Result Value Ref Range Status   Specimen Description BLOOD LEFT HAND  Final   Special Requests BOTTLES DRAWN AEROBIC AND ANAEROBIC 1CC EA  Final   Culture   Final           BLOOD CULTURE RECEIVED NO GROWTH TO DATE CULTURE WILL BE HELD FOR 5 DAYS BEFORE ISSUING A FINAL NEGATIVE REPORT Note: Culture results may be compromised due to an excessive volume of blood received in culture bottles. Performed at [facility]    Report Status PENDING  Incomplete  Urine culture     Status: None   Collection Time: 10/31/14  5:27 AM  Result Value Ref Range Status   Specimen Description URINE, CLEAN CATCH  Final   Special Requests NONE  Final   Colony Count NO GROWTH Performed at [facility]   Final   Culture NO GROWTH Performed at [facility]   Final   Report Status 11/01/2014 FINAL  Final    Coagulation Studies:  Recent Labs  10/31/14 0158  LABPROT 13.4  INR 1.00    Imaging: Dg Forearm Left  10/31/2014   CLINICAL DATA:  Left arm weakness and numbness  EXAM: LEFT FOREARM - 2 VIEW  COMPARISON:  None.  FINDINGS: There is no evidence of fracture or other focal bone lesions. Soft tissues are unremarkable.  IMPRESSION: No acute abnormality noted.   Electronically Signed   By: Alcide Clever M.D.   On: 10/31/2014 09:07   Ct Head Wo Contrast  10/31/2014   CLINICAL DATA:  Acute onset of left arm numbness and tingling, with difference in systolic blood pressure between the arms. Initial encounter.  EXAM: CT HEAD WITHOUT CONTRAST  TECHNIQUE: Contiguous axial images were obtained from the base of the skull through the vertex without intravenous contrast.   COMPARISON:  None.  FINDINGS: There is no evidence of acute infarction, mass lesion, or intra- or extra-axial hemorrhage on CT.  The posterior fossa, including the cerebellum, brainstem and fourth ventricle, is within normal limits. The third and lateral ventricles, and basal ganglia are unremarkable in appearance. The cerebral hemispheres are symmetric in appearance, with normal gray-white differentiation. No mass effect or midline shift is seen.  There is no evidence of fracture; visualized osseous structures are unremarkable in appearance. The visualized portions of the orbits are within normal limits. The paranasal sinuses and mastoid air cells are well-aerated. No significant soft tissue abnormalities are seen.  IMPRESSION: Unremarkable noncontrast CT of the head.   Electronically Signed   By: Roanna Raider M.D.   On: 10/31/2014 03:43   Ct Angio Chest Pe W/cm &/or Wo Cm  10/31/2014   CLINICAL DATA:  Acute onset of epigastric abdominal pain, left arm numbness and tingling, and difference in systolic blood pressure between the arms. Left arm tingling and numbness worsens with deep breaths. Initial encounter.  EXAM: CT ANGIOGRAPHY CHEST WITH CONTRAST  TECHNIQUE: Multidetector CT imaging of the chest was performed using the standard protocol during bolus administration of intravenous contrast. Multiplanar CT image reconstructions and MIPs were obtained to evaluate the vascular anatomy.  CONTRAST:  80mL OMNIPAQUE IOHEXOL 350 MG/ML SOLN  COMPARISON:  None.  FINDINGS: There is no evidence of aortic dissection. There is no evidence of aneurysmal dilatation. No calcific atherosclerotic disease is seen. The great vessels are patent and unremarkable in appearance.  There is no evidence of pulmonary embolus.  Minimal bibasilar atelectasis  is noted. The lungs are otherwise clear. There is no evidence of significant focal consolidation, pleural effusion or pneumothorax. No masses are identified; no abnormal focal contrast  enhancement is seen.  The mediastinum is unremarkable in appearance. No mediastinal lymphadenopathy is seen. No pericardial effusion is identified. No axillary lymphadenopathy is seen. The visualized portions of the thyroid gland are unremarkable in appearance.  The visualized portions of the liver and spleen are unremarkable.  No acute osseous abnormalities are seen.  Review of the MIP images confirms the above findings.  IMPRESSION: 1. No evidence of aortic dissection. No evidence of aneurysmal dilatation. No calcific atherosclerotic disease seen. Great vessels patent and unremarkable in appearance. 2. No evidence of pulmonary embolus. 3. Minimal bibasilar atelectasis noted; lungs otherwise clear.   Electronically Signed   By: Roanna RaiderJeffery  Chang M.D.   On: 10/31/2014 03:47   Mr Laqueta JeanBrain W MVWo Contrast  10/31/2014   CLINICAL DATA:  34 year old male with history of IVDU, now with acute inability to flex/extend left upper extremity with loss of sensation. Concern for possible septic emboli.  EXAM: MRI HEAD WITHOUT AND WITH CONTRAST  TECHNIQUE: Multiplanar, multiecho pulse sequences of the brain and surrounding structures were obtained without and with intravenous contrast.  CONTRAST:  15mL MULTIHANCE GADOBENATE DIMEGLUMINE 529 MG/ML IV SOLN  COMPARISON:  Prior head CT performed earlier on the same day.  FINDINGS: The CSF containing spaces are within normal limits for patient age. No focal parenchymal signal abnormality is identified. No mass lesion, midline shift, or extra-axial fluid collection. Ventricles are normal in size without evidence of hydrocephalus.  No diffusion-weighted signal abnormality is identified to suggest acute intracranial infarct. Gray-white matter differentiation is maintained. Normal flow voids are seen within the intracranial vasculature. No intracranial hemorrhage identified.  No abnormal enhancement identified.  The cervicomedullary junction is normal. Pituitary gland is within normal limits.  Pituitary stalk is midline. The globes and optic nerves demonstrate a normal appearance with normal signal intensity.  The bone marrow signal intensity is normal. Calvarium is intact. Visualized upper cervical spine is within normal limits.  Scalp soft tissues are unremarkable.  Paranasal sinuses are clear.  No mastoid effusion.  IMPRESSION: Normal contrast enhanced MRI of the brain with no acute intracranial abnormality identified.   Electronically Signed   By: Rise MuBenjamin  McClintock M.D.   On: 10/31/2014 06:45   Dg Hand 2 View Left  10/31/2014   CLINICAL DATA:  Left hand weakness and numbness  EXAM: LEFT HAND - 2 VIEW  COMPARISON:  None.  FINDINGS: There is no evidence of fracture or dislocation. There is no evidence of arthropathy or other focal bone abnormality. Soft tissues are unremarkable.  IMPRESSION: No acute abnormality noted.   Electronically Signed   By: Alcide CleverMark  Lukens M.D.   On: 10/31/2014 09:08   Dg Chest Port 1 View  10/31/2014   CLINICAL DATA:  Chest pain and paralysis of the left hand.  EXAM: PORTABLE CHEST - 1 VIEW  COMPARISON:  02/24/2012  FINDINGS: The heart size and mediastinal contours are within normal limits. Both lungs are clear. The visualized skeletal structures are unremarkable.  IMPRESSION: Negative portable chest.   Electronically Signed   By: Marnee SpringJonathon  Watts M.D.   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 The pain at onset could be suggestive of parsonage-turner syndrome but this is typically slower and more painful.   1) MRI c-spine and brachial plexus.  2) Will continue to follow.   Ritta Slot,  MD [facility] [phone]  If 7pm- 7am, please page neurology on call as listed in AMION.

## 2014-11-02 ENCOUNTER — Observation Stay (HOSPITAL_COMMUNITY): Payer: Medicaid Other

## 2014-11-02 LAB — BASIC METABOLIC PANEL
ANION GAP: 11 (ref 5–15)
BUN: 13 mg/dL (ref 6–23)
CHLORIDE: 102 mmol/L (ref 96–112)
CO2: 23 mmol/L (ref 19–32)
Calcium: 9.3 mg/dL (ref 8.4–10.5)
Creatinine, Ser: 0.87 mg/dL (ref 0.50–1.35)
GFR calc non Af Amer: >90 mL/min (ref >90)
Glucose, Bld: 91 mg/dL (ref 70–99)
POTASSIUM: 4.5 mmol/L (ref 3.5–5.1)
Sodium: 136 mmol/L (ref 135–145)

## 2014-11-02 MED ORDER — GADOBENATE DIMEGLUMINE 529 MG/ML IV SOLN
15.0000 mL | Freq: Once | INTRAVENOUS | Status: AC | PRN
  Administered 2014-11-02: 15 mL via INTRAVENOUS

## 2014-11-02 NOTE — Discharge Instructions (Signed)
Thank you for allowing [place] to be involved in your healthcare while you were hospitalized at [facility].   Please note that there have been changes to your home medications.  --> PLEASE LOOK AT YOUR DISCHARGE MEDICATION LIST FOR DETAILS.  Please call your PCP if you have any questions or concerns, or any difficulty getting any of your medications.  Please return to the ER if you have worsening of your symptoms or new severe symptoms arise.  Please follow up with your orthopedic surgeon to have your foot evaluated and to have your sutures removed at the appropriate time.  Please follow up with Neurology for further workup for your left arm weakness. Details of your appointment are listed in this packet (11/13/14 with Dr. Lucia Gaskins at 7:30 am).   Please continue physical therapy as outpatient.  [facility] [address] [place], [place] [address] [phone]  Other rehabilitation centers are located at the following website: [email or web address]  Radial Nerve Palsy Wrist drop is also known as radial nerve palsy. It is a condition in which you can not extend your wrist. This means if you are standing with your elbow bent at a right angle and with the top of your hand pointed at the ceiling, you can not hold your hand up. It falls toward the floor.  This action of extending your wrist is caused by the muscles in the back of your arm. These muscles are controlled by the radial nerve. This means that anything affecting the radial nerve so it can not tell the muscles how to work will cause wrist drop. This is medically called radial nerve palsy. Also the radial nerve is a motor and sensory nerve so anything affecting it causes problems with movement and feeling. CAUSES  Some more common causes of wrist drop are:  A break (fracture) of the large bone in the arm between your shoulder  and your elbow (humerus). This is because the radial nerve winds around the humerus.  Improper use of crutches causes this because the radial nerve runs through the armpit (axilla). Crutches which are too long can put pressure on the nerve. This is sometimes called crutch palsy.  Falling asleep with your arm over a chair and supported on the back is a common cause. This is sometimes called Saturday Night Syndrome.  Wrist drop can be associated with lead poisoning because of the effect of lead on the radial nerve. SYMPTOMS  The wrist drop is an obvious problem, but there may also be numbness in the back of the arm, forearm or hand which provides feeling in these areas by the radial nerve. There can be difficulty straightening out the elbow in addition to the wrist. There may be numbness, tingling, pain, burning sensations or other abnormal feelings. Symptoms depend entirely on where the radial nerve is injured. DIAGNOSIS   Wrist drop is obvious just by looking at it. Your caregiver may make the diagnosis by taking your history and doing a couple tests.  One test which may be done is a nerve conduction study. This test shows if the radial nerve is conducting signals well. If not, it can determine where the nerve problem is.  Sometimes X-ray studies are done. Your caregiver will determine if further testing needs to be done. TREATMENT   Usually if the problem is found to be pressure on the nerve, simply removing the pressure will allow the nerve to go back to normal in a few weeks  to a few months. Other treatments will depend upon the cause found.  Only take over-the-counter or prescription medicines for pain, discomfort, or fever as directed by your caregiver.  Sometimes seizure medications are used.  Steroids are sometimes given to decrease swelling if it is thought to be a possible cause. Document Released: 04/24/2006 Document Revised: 11/10/2011 Document Reviewed: 11/02/2013 [facility]ExitCare  Patient Information 2015 [employer] [place]. This information is not intended to replace advice given to you by your health care provider. Make sure you discuss any questions you have with your health care provider.

## 2014-11-02 NOTE — Progress Notes (Signed)
Physical Therapy Treatment Patient Details Name: Kenneth Charles MRN: [id number] DOB: 10/21/1980 Today's Date: 11/02/2014    History of Present Illness Adm due to weakness LUE (pt with fall off deck ~4 weeks ago; s/p Lt calcaneal ORIF-NWB) PMHx- polysubstance abuse (+for cocaine on adm)    PT Comments    Attempted to have patient use PFRW for mobility due to L arm flexion but patient declined. Educated on safety and services once he is out of the hospital. Patient only qualifies for outpatient therapy. Patient understand but states he may have trouble finding a ride. Followed up with Case manager and RN aware. Patient will DC today and be staying with his aunt and have assist from her and his mother.   Follow Up Recommendations  Outpatient PT (this is his only option for therapy services)     Equipment Recommendations  Rolling walker with 5" wheels;Other (comment);3in1 (PT)    Recommendations for Other Services       Precautions / Restrictions Precautions Precautions: Fall Precaution Comments: pt keeping Lt arm flexed throughout session  Restrictions LLE Weight Bearing: Non weight bearing    Mobility  Bed Mobility Overal bed mobility: Independent                Transfers Overall transfer level: Modified independent                  Ambulation/Gait Ambulation/Gait assistance: Min guard Ambulation Distance (Feet): 14 Feet Assistive device: Rolling walker (2 wheeled) Gait Pattern/deviations: Step-to pattern Gait velocity: impulsively fast   General Gait Details: cues for safety with RW; pt keeping Lt bicep flexed and using on Rt UE and LE to manage RW and hop around bed. DId not want to use PFRW for mobility.    Stairs            Wheelchair Mobility    Modified Rankin (Stroke Patients Only)       Balance                                    Cognition Arousal/Alertness: Awake/alert Behavior During Therapy: WFL for tasks  assessed/performed Overall Cognitive Status: Within Functional Limits for tasks assessed                      Exercises      General Comments        Pertinent Vitals/Pain Pain Score: 7  Pain Location: lt heel Pain Descriptors / Indicators: Sore Pain Intervention(s): Monitored during session;Repositioned    Home Living                      Prior Function            PT Goals (current goals can now be found in the care plan section) Progress towards PT goals: Progressing toward goals    Frequency  Min 4X/week    PT Plan Current plan remains appropriate    Co-evaluation             End of Session   Activity Tolerance: Patient tolerated treatment well;Patient limited by pain Patient left: in chair;with call bell/phone within reach     Time: 1346-1356 PT Time Calculation (min) (ACUTE ONLY): 10 min  Charges:  $Gait Training: 8-22 mins                    G Codes:  Fredrich BirksRobinette, Julia Elizabeth 11/02/2014, 2:44 PM 11/02/2014 Fredrich Birksobinette, Julia Elizabeth PTA [phone] pager [phone] office

## 2014-11-02 NOTE — Progress Notes (Signed)
Subjective: Reports no change.   Exam: Filed Vitals:   11/02/14 0500  BP: 122/46  Pulse: 84  Temp: 97.6 F (36.4 C)  Resp: 17   Gen: In bed, NAD MS: Awake, alert, interactive and appropriate.  ZO:XWRUECN:PERRL, EOMI Motor: good strength in shoulder and bicep, though question some mild weakness there today. Much more pronounced in the distribution of the posterior cord of the plexus.  Sensory:decreased over the dorsum of the hadn.  AVW:UJWJXBTR:intact BR and bicep, absent tricep on the left.    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 Parsonage-turner I think is certainly a possibility but typically is much more painful. Compressive lesion would be possible as well. At this time, without structural cause on MRI, I think that the next step would be physical therapy and outpatient follow up for EMG.   Recommendations: 1) Outpatient EMG 2) PT 3) no further workup as inpatient at this time.   Ritta SlotMcNeill Kirkpatrick, MD [facility] [phone]  If 7pm- 7am, please page neurology on call as listed in AMION.

## 2014-11-02 NOTE — Progress Notes (Signed)
D/C orders received. Pt educated on d/c instructions. Pt verbalized understanding. IV removed. Home equipment delivered to pt's room. Pt taken downstairs by staff via wheelchair.

## 2014-11-02 NOTE — Clinical Social Work Note (Signed)
Clinical Social Worker met with patient yesterday to inform him that he does not qualify for SNF placement.. CSW asked pt if he had any family support and pt stated he has his mother and father. CSW asked pt if he could possibly stay with parents for assistance with mobility at home and patient further reported "my parents has nothing to do with me". Pt's parents have been visiting pt at bedside and pt's mother contacted the unit for CSW to fax letter to pt's attorney on Monday.   CSW notified RNCM who also met with pt to discuss alternative options such as private home health care agencies and a possible charity for home health equipment. RNCM provided pt with home health care list. In addition RNCM also contacted financial counselor to meet with pt on the status of his Medicaid application.   Pt noticeably upset that he cannot be admitted into SNF and asked several questions in regards to the status of his Medicaid application. CSW explained Medicaid process and that it takes at least 45 days to process. CSW discussed case with RNCM, CSW AD, and Dr. Marvel Plan.    Clinical Social Worker will sign off for now as social work intervention is no longer needed. Please consult [place] again if new need arises.  Glendon Axe, MSW, LCSWA [phone] 11/02/2014 10:09 AM

## 2014-11-02 NOTE — Progress Notes (Signed)
Subjective:  Patient was seen and examined this morning. Patient continues to have weakness in left upper extremity without improvement. He denies pain in his left arm. Pain in left heel is controlled with oxycodone.   Objective: Vital signs in last 24 hours: Filed Vitals:   11/01/14 2200 11/02/14 0100 11/02/14 0500 11/02/14 1100  BP: 135/77 109/74 122/46 132/70  Pulse: 93 89 84 84  Temp: 98 F (36.7 C) 98.9 F (37.2 C) 97.6 F (36.4 C) 97.6 F (36.4 C)  TempSrc:      Resp: Height:      Weight:      SpO2: 96% 100% 97% 98%   Weight change:   Intake/Output Summary (Last 24 hours) at 11/02/14 1314 Last data filed at 11/02/14 0900  Gross per 24 hour  Intake    960 ml  Output      0 ml  Net    960 ml   General: Vital signs reviewed.  Patient is well-developed and well-nourished, in no acute distress and cooperative with exam.  Neck: No pain on palpation of spine, no masses. Cardiovascular: RRR, no murmurs, gallops, or rubs. Pulmonary/Chest: Clear to auscultation bilaterally, no wheezes, rales, or rhonchi. Abdominal: Soft, non-tender, non-distended, BS + Extremities: Fasciculations present. 1/5 grip strength in LUE, 2/5 strength with flexion and extension on LUE. 3-4/5 strength in left deltoid. Decreased ROM. Decreased sensation in left upper extremity. Normal left radial pulse. No lower extremity edema bilaterally, left foot s/p surgery, surgical wound C/D/I, with edema, pulses symmetric and intact bilaterally.  Skin: Warm, dry and intact. Psychiatric: Normal mood and affect. speech and behavior is normal. Cognition and memory are normal.   Lab Results: Basic Metabolic Panel:  Recent Labs Lab 10/31/14 0158 11/02/14 0506  NA 138 136  K 3.7 4.5  CL 102 102  CO2 26 23  GLUCOSE 101* 91  BUN 13 13  CREATININE 0.93 0.87  CALCIUM 9.7 9.3   Liver Function Tests:  Recent Labs Lab 10/31/14 0158  AST 23  ALT 24  ALKPHOS 94  BILITOT 0.5  PROT 7.2    ALBUMIN 3.8    Recent Labs Lab 10/31/14 0158  LIPASE 35   CBC:  Recent Labs Lab 10/31/14 0158 11/01/14 0804  WBC 12.1* 8.3  NEUTROABS 7.3  --   HGB 13.8 13.6  HCT 41.3 41.4  MCV 89.8 91.6  PLT 411* 378   Cardiac Enzymes:  Recent Labs Lab 10/31/14 0445 10/31/14 1400 10/31/14 1848  TROPONINI <0.03 <0.03 <0.03   Coagulation:  Recent Labs Lab 10/31/14 0158  LABPROT 13.4  INR 1.00   Urine Drug Screen: Drugs of Abuse     Component Value Date/Time   LABOPIA NONE DETECTED 10/31/2014 0527   COCAINSCRNUR POSITIVE* 10/31/2014 0527   LABBENZ NONE DETECTED 10/31/2014 0527   AMPHETMU NONE DETECTED 10/31/2014 0527   THCU NONE DETECTED 10/31/2014 0527   LABBARB NONE DETECTED 10/31/2014 0527    Urinalysis:  Recent Labs Lab 10/31/14 0527  COLORURINE YELLOW  LABSPEC 1.030  PHURINE 5.0  GLUCOSEU NEGATIVE  HGBUR NEGATIVE  BILIRUBINUR NEGATIVE  KETONESUR NEGATIVE  PROTEINUR NEGATIVE  UROBILINOGEN 0.2  NITRITE NEGATIVE  LEUKOCYTESUR NEGATIVE    Micro Results: Recent Results (from the past 240 hour(s))  Culture, blood (routine x 2)     Status: None (Preliminary result)   Collection Time: 10/31/14  1:58 AM  Result Value Ref Range Status   Specimen Description BLOOD RIGHT HAND  Final  Special Requests BOTTLES DRAWN AEROBIC AND ANAEROBIC 5CC EA  Final   Culture   Final           BLOOD CULTURE RECEIVED NO GROWTH TO DATE CULTURE WILL BE HELD FOR 5 DAYS BEFORE ISSUING A FINAL NEGATIVE REPORT Performed at [facility]    Report Status PENDING  Incomplete  Culture, blood (routine x 2)     Status: None (Preliminary result)   Collection Time: 10/31/14  2:00 AM  Result Value Ref Range Status   Specimen Description BLOOD LEFT HAND  Final   Special Requests BOTTLES DRAWN AEROBIC AND ANAEROBIC 1CC EA  Final   Culture   Final           BLOOD CULTURE RECEIVED NO GROWTH TO DATE CULTURE WILL BE HELD FOR 5 DAYS BEFORE ISSUING A FINAL NEGATIVE REPORT Note: Culture  results may be compromised due to an excessive volume of blood received in culture bottles. Performed at [facility]    Report Status PENDING  Incomplete  Urine culture     Status: None   Collection Time: 10/31/14  5:27 AM  Result Value Ref Range Status   Specimen Description URINE, CLEAN CATCH  Final   Special Requests NONE  Final   Colony Count NO GROWTH Performed at [facility]   Final   Culture NO GROWTH Performed at [facility]   Final   Report Status 11/01/2014 FINAL  Final   Studies/Results: Mr Chest W Wo Contrast  11/02/2014   CLINICAL DATA:  Acute onset left upper extremity weakness 10/31/2014. Question brachial plexus abnormality.  EXAM: MRI CHEST WITHOUT AND WITH CONTRAST  TECHNIQUE: Multiplanar, multisequence MR imaging was performed both before and after administration of intravenous contrast.  CONTRAST:  15 ml MULTIHANCE GADOBENATE DIMEGLUMINE 529 MG/ML IV SOLN  COMPARISON:  CT chest 10/31/2014.  FINDINGS: The brachial plexus is normal in appearance. No lesion compressing the brachial plexus is identified and no mass is seen. All visualized musculature is intact and normal in appearance and demonstrates normal signal. Although not designed to evaluate intrinsic structures of the shoulder, the rotator cuff is in appears intact and normal. The acromioclavicular joint is unremarkable. All visualized bones demonstrate normal signal. Two small left axillary lymph nodes are incidentally noted. No pathologic lymphadenopathy by CT size criteria seen.  IMPRESSION: Normal examination.  No finding to explain the patient's symptoms.   Electronically Signed   By: Drusilla Kanner M.D.   On: 11/02/2014 08:48   Mr Cervical Spine W Wo Contrast  11/02/2014   CLINICAL DATA:  Left arm weakness  EXAM: MRI CERVICAL SPINE WITHOUT AND WITH CONTRAST  TECHNIQUE: Multiplanar and multiecho pulse sequences of the cervical spine, to include the craniocervical junction and  cervicothoracic junction, were obtained according to standard protocol without and with intravenous contrast.  CONTRAST:  15mL MULTIHANCE GADOBENATE DIMEGLUMINE 529 MG/ML IV SOLN  COMPARISON:  Cervical spine CT 02/24/2004  FINDINGS: Normal cervical alignment. Negative for fracture or mass lesion. Bone marrow signal normal.  Cervical spine has normal signal. No cord lesion or cord compression. Craniocervical junction normal.  No significant disc degeneration. Negative for disc protrusion or neural impingement. Negative for spinal stenosis.  IMPRESSION: Normal   Electronically Signed   By: Marlan Palau M.D.   On: 11/02/2014 08:23   Medications:  I have reviewed the patient's current medications. Prior to Admission:  Prescriptions prior to admission  Medication Sig Dispense Refill Last Dose  . Oxycodone HCl  10 MG TABS Take 1 tablet by mouth every 6 (six) hours as needed (for pain).    10/30/2014 at Unknown time  . dicyclomine (BENTYL) 20 MG tablet Take 1 tablet (20 mg total) by mouth 2 (two) times daily. (Patient not taking: Reported on 10/31/2014) 20 tablet 0 Not Taking at Unknown time  . diphenoxylate-atropine (LOMOTIL) 2.5-0.025 MG per tablet Take 2 tablets by mouth 4 (four) times daily as needed for diarrhea or loose stools. (Patient not taking: Reported on 10/31/2014) 30 tablet 0 Not Taking at Unknown time  . loperamide (IMODIUM) 2 MG capsule Take 1 capsule (2 mg total) by mouth 4 (four) times daily as needed for diarrhea or loose stools. (Patient not taking: Reported on 10/31/2014) 12 capsule 0 Not Taking at Unknown time  . ondansetron (ZOFRAN ODT) 4 MG disintegrating tablet 4mg  ODT q4 hours prn nausea/vomit (Patient not taking: Reported on 10/31/2014) 15 tablet 0 Not Taking at Unknown time   Scheduled Meds: . enoxaparin (LOVENOX) injection  40 mg Subcutaneous Daily  . 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 Other differentials include [place]-Turner Syndrome due to pain at onset. MRI C-Spine and Brachial Plexus normal. Without structural cause, patient will be discharged with outpatient physical therapy and outpatient neurology follow up for EMG. OT recommending SNF versus 24 hour supervision; however, per PT and CM, patient does not qualify for SNF or HH unfortunately. Patient talked to mother and aunt who are willing to help care for him at home.  -PT/OT -Neurology follow up as outpatient -Discharge to home -Neurology following, appreciate recommendations -Rolling walker 5", 3 in 1, Lt platform RW and drop arm commode  IVDU: UDS positive for cocaine. Also with history of heroin use one year ago. -Counseled on abstinence   Left Calcaneal Fracture: [facility]. Surgical incision site is C/D/I. Pain controlled with medication.  -Oxycodone 10 mg Q6H prn   Code Status: FULL CODE  F/E/N: Regular diet   VTE Ppx: Lovenox SQ  Dispo: Disposition is deferred at this time, awaiting improvement of current medical problems.  Anticipated discharge in approximately 1-2 day(s).   The patient does not have a current PCP (Pcp Not In System) and does need an [facility] follow-up appointment after discharge.  The patient does not have transportation limitations that hinder transportation to clinic appointments.  .Services Needed at time of discharge: Y = Yes, Blank = No PT:   OT:   RN:   Equipment:   Other:       Jill AlexandersAlexa Richardson, DO PGY-1 Internal Medicine Resident Pager #  [phone] 11/02/2014 1:14 PM

## 2014-11-02 NOTE — Clinical Social Work Note (Signed)
Clinical Social Worker notified by Dr. Senaida Oresichardson that per RN, patient reported he will return home with his mother and aunt. CSW advised, MD to contact RNCM for additional information in regards to home health care services. CSW to notify RNCM.   Clinical Social Worker will sign off for now as social work intervention is no longer needed. Please consult us again if new need arises.  Derenda FennelBashira Sarah Baez, MSW, LCSWA [phone] 11/02/2014 1:05 PM

## 2014-11-03 NOTE — Discharge Summary (Signed)
Name: Kenneth Charles MRN: [id number] DOB: August 26, 1981 34 y.o. PCP: Pcp Not In System  Date of Admission: 10/31/2014  1:13 AM Date of Discharge: 11/02/14 Attending Physician: Dr. Heide SparkNarendra  Discharge Diagnosis:  Principal Problem:   Left arm weakness Active Problems:   Current tobacco use   Calcaneal fracture   Polydrug dependence including opioid type drug, episodic abuse   Atypical chest pain   Calcaneus fracture   Chest pain  Discharge Medications:   Medication List    TAKE these medications        dicyclomine 20 MG tablet  Commonly known as:  BENTYL  Take 1 tablet (20 mg total) by mouth 2 (two) times daily.     diphenoxylate-atropine 2.5-0.025 MG per tablet  Commonly known as:  LOMOTIL  Take 2 tablets by mouth 4 (four) times daily as needed for diarrhea or loose stools.     loperamide 2 MG capsule  Commonly known as:  IMODIUM  Take 1 capsule (2 mg total) by mouth 4 (four) times daily as needed for diarrhea or loose stools.     ondansetron 4 MG disintegrating tablet  Commonly known as:  ZOFRAN ODT  4mg  ODT q4 hours prn nausea/vomit     Oxycodone HCl 10 MG Tabs  Take 1 tablet by mouth every 6 (six) hours as needed (for pain).        Disposition and follow-up:   Kenneth Charles was discharged from [facility] in Stable condition.  At the hospital follow up visit please address:  1.  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 Consider outpatient EMG.  Calcaneal Fracture s/p Surgical Repair: Please assess if patient has followed up with orthopedic surgeon.  2.  Labs / imaging needed at time of follow-up: EMG  3.  Pending labs/ test needing follow-up: None  Follow-up Appointments: Follow-up Information    Follow up with Anson FretAhern, Antonia B, MD On 11/13/2014.   Specialty:  Neurology   Why:  7:30 am   Contact information:   [address] [place]  [id number] [phone]       Discharge Instructions: Discharge Instructions    Ambulatory referral to Physical Therapy    Complete by:  As directed      Diet - low sodium heart healthy    Complete by:  As directed      Increase activity slowly    Complete by:  As directed            Consultations: Treatment Team:  Kym GroomNeuro1 [facility], MD  Procedures Performed:  Dg Forearm Left  10/31/2014   CLINICAL DATA:  Left arm weakness and numbness  EXAM: LEFT FOREARM - 2 VIEW  COMPARISON:  None.  FINDINGS: There is no evidence of fracture or other focal bone lesions. Soft tissues are unremarkable.  IMPRESSION: No acute abnormality noted.   Electronically Signed   By: Alcide CleverMark  Lukens M.D.   On: 10/31/2014 09:07   Ct Head Wo Contrast  10/31/2014   CLINICAL DATA:  Acute onset of left arm numbness and tingling, with difference in systolic blood pressure between the arms. Initial encounter.  EXAM: CT HEAD WITHOUT CONTRAST  TECHNIQUE: Contiguous axial images were obtained from the base of the skull through the vertex without intravenous contrast.  COMPARISON:  None.  FINDINGS: There is no evidence of acute infarction, mass lesion, or intra- or extra-axial hemorrhage on CT.  The posterior fossa, including the cerebellum, brainstem  and fourth ventricle, is within normal limits. The third and lateral ventricles, and basal ganglia are unremarkable in appearance. The cerebral hemispheres are symmetric in appearance, with normal gray-white differentiation. No mass effect or midline shift is seen.  There is no evidence of fracture; visualized osseous structures are unremarkable in appearance. The visualized portions of the orbits are within normal limits. The paranasal sinuses and mastoid air cells are well-aerated. No significant soft tissue abnormalities are seen.  IMPRESSION: Unremarkable noncontrast CT of the head.   Electronically Signed   By: Roanna Raider M.D.   On: 10/31/2014 03:43   Ct Angio Chest Pe W/cm &/or Wo  Cm  10/31/2014   CLINICAL DATA:  Acute onset of epigastric abdominal pain, left arm numbness and tingling, and difference in systolic blood pressure between the arms. Left arm tingling and numbness worsens with deep breaths. Initial encounter.  EXAM: CT ANGIOGRAPHY CHEST WITH CONTRAST  TECHNIQUE: Multidetector CT imaging of the chest was performed using the standard protocol during bolus administration of intravenous contrast. Multiplanar CT image reconstructions and MIPs were obtained to evaluate the vascular anatomy.  CONTRAST:  80mL OMNIPAQUE IOHEXOL 350 MG/ML SOLN  COMPARISON:  None.  FINDINGS: There is no evidence of aortic dissection. There is no evidence of aneurysmal dilatation. No calcific atherosclerotic disease is seen. The great vessels are patent and unremarkable in appearance.  There is no evidence of pulmonary embolus.  Minimal bibasilar atelectasis is noted. The lungs are otherwise clear. There is no evidence of significant focal consolidation, pleural effusion or pneumothorax. No masses are identified; no abnormal focal contrast enhancement is seen.  The mediastinum is unremarkable in appearance. No mediastinal lymphadenopathy is seen. No pericardial effusion is identified. No axillary lymphadenopathy is seen. The visualized portions of the thyroid gland are unremarkable in appearance.  The visualized portions of the liver and spleen are unremarkable.  No acute osseous abnormalities are seen.  Review of the MIP images confirms the above findings.  IMPRESSION: 1. No evidence of aortic dissection. No evidence of aneurysmal dilatation. No calcific atherosclerotic disease seen. Great vessels patent and unremarkable in appearance. 2. No evidence of pulmonary embolus. 3. Minimal bibasilar atelectasis noted; lungs otherwise clear.   Electronically Signed   By: Roanna Raider M.D.   On: 10/31/2014 03:47   Mr Laqueta Jean ZO Contrast  10/31/2014   CLINICAL DATA:  34 year old male with history of IVDU, now with  acute inability to flex/extend left upper extremity with loss of sensation. Concern for possible septic emboli.  EXAM: MRI HEAD WITHOUT AND WITH CONTRAST  TECHNIQUE: Multiplanar, multiecho pulse sequences of the brain and surrounding structures were obtained without and with intravenous contrast.  CONTRAST:  15mL MULTIHANCE GADOBENATE DIMEGLUMINE 529 MG/ML IV SOLN  COMPARISON:  Prior head CT performed earlier on the same day.  FINDINGS: The CSF containing spaces are within normal limits for patient age. No focal parenchymal signal abnormality is identified. No mass lesion, midline shift, or extra-axial fluid collection. Ventricles are normal in size without evidence of hydrocephalus.  No diffusion-weighted signal abnormality is identified to suggest acute intracranial infarct. Gray-white matter differentiation is maintained. Normal flow voids are seen within the intracranial vasculature. No intracranial hemorrhage identified.  No abnormal enhancement identified.  The cervicomedullary junction is normal. Pituitary gland is within normal limits. Pituitary stalk is midline. The globes and optic nerves demonstrate a normal appearance with normal signal intensity.  The bone marrow signal intensity is normal. Calvarium is intact. Visualized upper cervical spine is  within normal limits.  Scalp soft tissues are unremarkable.  Paranasal sinuses are clear.  No mastoid effusion.  IMPRESSION: Normal contrast enhanced MRI of the brain with no acute intracranial abnormality identified.   Electronically Signed   By: Rise Mu M.D.   On: 10/31/2014 06:45   Mr Chest W Wo Contrast  11/02/2014   CLINICAL DATA:  Acute onset left upper extremity weakness 10/31/2014. Question brachial plexus abnormality.  EXAM: MRI CHEST WITHOUT AND WITH CONTRAST  TECHNIQUE: Multiplanar, multisequence MR imaging was performed both before and after administration of intravenous contrast.  CONTRAST:  15 ml MULTIHANCE GADOBENATE DIMEGLUMINE 529  MG/ML IV SOLN  COMPARISON:  CT chest 10/31/2014.  FINDINGS: The brachial plexus is normal in appearance. No lesion compressing the brachial plexus is identified and no mass is seen. All visualized musculature is intact and normal in appearance and demonstrates normal signal. Although not designed to evaluate intrinsic structures of the shoulder, the rotator cuff is in appears intact and normal. The acromioclavicular joint is unremarkable. All visualized bones demonstrate normal signal. Two small left axillary lymph nodes are incidentally noted. No pathologic lymphadenopathy by CT size criteria seen.  IMPRESSION: Normal examination.  No finding to explain the patient's symptoms.   Electronically Signed   By: Drusilla Kanner M.D.   On: 11/02/2014 08:48   Mr Cervical Spine W Wo Contrast  11/02/2014   CLINICAL DATA:  Left arm weakness  EXAM: MRI CERVICAL SPINE WITHOUT AND WITH CONTRAST  TECHNIQUE: Multiplanar and multiecho pulse sequences of the cervical spine, to include the craniocervical junction and cervicothoracic junction, were obtained according to standard protocol without and with intravenous contrast.  CONTRAST:  15mL MULTIHANCE GADOBENATE DIMEGLUMINE 529 MG/ML IV SOLN  COMPARISON:  Cervical spine CT 02/24/2004  FINDINGS: Normal cervical alignment. Negative for fracture or mass lesion. Bone marrow signal normal.  Cervical spine has normal signal. No cord lesion or cord compression. Craniocervical junction normal.  No significant disc degeneration. Negative for disc protrusion or neural impingement. Negative for spinal stenosis.  IMPRESSION: Normal   Electronically Signed   By: Marlan Palau M.D.   On: 11/02/2014 08:23   Dg Hand 2 View Left  10/31/2014   CLINICAL DATA:  Left hand weakness and numbness  EXAM: LEFT HAND - 2 VIEW  COMPARISON:  None.  FINDINGS: There is no evidence of fracture or dislocation. There is no evidence of arthropathy or other focal bone abnormality. Soft tissues are unremarkable.   IMPRESSION: No acute abnormality noted.   Electronically Signed   By: Alcide Clever M.D.   On: 10/31/2014 09:08   Dg Chest Port 1 View  10/31/2014   CLINICAL DATA:  Chest pain and paralysis of the left hand.  EXAM: PORTABLE CHEST - 1 VIEW  COMPARISON:  02/24/2012  FINDINGS: The heart size and mediastinal contours are within normal limits. Both lungs are clear. The visualized skeletal structures are unremarkable.  IMPRESSION: Negative portable chest.   Electronically Signed   By: Marnee Spring M.D.   On: 10/31/2014 02:13    Admission HPI: This is a 34 year old gentleman with past medical history of asthma, IV drug use, and recent fracture of the left calcaneus bone, status post surgery on 2/4 who presents with acute onset of left arm weakness and chest pains. He went to bed last night feeling well before he woke up at around 11 PM with numbness and weakness involving the left upper extremity. He notes that he was unable to move his arm.  He denies trauma or falls on that arm. He denies excessive use of his crutches as he has only been using them minimally (less than 5 minutes yesterday). Currently he is unable to use his crutches at all, dressing himself, or to transfer due to his recent fracture and now with left arm weakness. He has been living home since leaving the hospital 4 weeks ago following his surgery.   He also described shortness of breath and chest pain which have since resolved with IV 30 mg of Toradol, Percocet 5-325 and GI cocktail administered in the ED. He described his chest pain as sharp, 7/10 intermittent, last several seconds and was increased with taking a deep breath. No pulmonary symptoms like cough, wheezing or chest tightness. He denies any constitutional symptoms. Patient endorses history of IV drug use with IV heroin a year ago and shooting cocaine a month ago. His UDS was positive for cocaine. He also smokes cigarettes, half pack a day for the last 15 years. He has no cardiac  history. EKG, chest x-ray, chest CTA and brain MRI done in the emergency department where all unremarkable. Due to his significant limitation, internal medicine was consulted to admit the patient for evaluation of this left arm weakness and possibility of rehabilitation placement since patient is unable take care of himself.  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 Evidence of fasciulations on examination. MRI brain and Xrays of Left arm were normal. Patient was seen by Neurology who thought symptoms were most consistent with brachial plexopathy or isolated posterior cord palsy. Other differentials include P[place]-Turner Syndrome due to pain at onset. MRI C-Spine and Brachial Plexus normal. Without structural cause, patient was discharged with outpatient physical therapy and outpatient neurology follow up for EMG. Please assess improvement of weakness on follow up and compliance with PT.  IVDU: UDS positive for cocaine. Also with history of heroin use one year ago. Patient was counseled on abstinence.  Left Calcaneal Fracture: Patient is status post ORIF done at [facility] 4 weeks ago. Surgical incision site was C/D/I. Pain controlled with home regimen of oxycodone 10 mg Q6H prn.   Discharge Vitals:   BP 125/75 mmHg  Pulse 94  Temp(Src) 98.4 F (36.9 C) (Oral)  Resp 18  Ht  (1.905 m)  Wt 160 lb (72.576 kg)  BMI 20.00 kg/m2  SpO2 99%  Signed: Jill Alexanders, DO PGY-1 Internal Medicine Resident Pager # [phone] 11/03/2014 6:49 PM  Services Ordered on Discharge: outpatient pt Equipment  Ordered on Discharge: 3 in 1, rolling walker, bedside commode

## 2014-11-06 LAB — CULTURE, BLOOD (ROUTINE X 2)
Culture: NO GROWTH
Culture: NO GROWTH

## 2014-11-13 ENCOUNTER — Ambulatory Visit: Payer: Self-pay | Admitting: Neurology

## 2014-11-14 ENCOUNTER — Encounter: Payer: Self-pay | Admitting: Neurology

## 2014-11-19 ENCOUNTER — Observation Stay (HOSPITAL_COMMUNITY)
Admission: EM | Admit: 2014-11-19 | Discharge: 2014-11-24 | Disposition: A | Discharge status: Skilled Nursing Facility | Payer: Medicaid Other | Attending: Oncology | Admitting: Oncology

## 2014-11-19 ENCOUNTER — Encounter (HOSPITAL_COMMUNITY): Payer: Self-pay | Admitting: Emergency Medicine

## 2014-11-19 ENCOUNTER — Other Ambulatory Visit (HOSPITAL_COMMUNITY): Payer: Self-pay

## 2014-11-19 ENCOUNTER — Emergency Department (HOSPITAL_COMMUNITY): Payer: Medicaid Other

## 2014-11-19 DIAGNOSIS — F17210 Nicotine dependence, cigarettes, uncomplicated: Secondary | ICD-10-CM | POA: Insufficient documentation

## 2014-11-19 DIAGNOSIS — M2651 Abnormal jaw closure: Secondary | ICD-10-CM

## 2014-11-19 DIAGNOSIS — R5383 Other fatigue: Secondary | ICD-10-CM

## 2014-11-19 DIAGNOSIS — W19XXXA Unspecified fall, initial encounter: Secondary | ICD-10-CM | POA: Insufficient documentation

## 2014-11-19 DIAGNOSIS — R29898 Other symptoms and signs involving the musculoskeletal system: Secondary | ICD-10-CM | POA: Diagnosis present

## 2014-11-19 DIAGNOSIS — F1920 Other psychoactive substance dependence, uncomplicated: Secondary | ICD-10-CM

## 2014-11-19 DIAGNOSIS — J45909 Unspecified asthma, uncomplicated: Secondary | ICD-10-CM | POA: Diagnosis not present

## 2014-11-19 DIAGNOSIS — R079 Chest pain, unspecified: Secondary | ICD-10-CM | POA: Diagnosis present

## 2014-11-19 DIAGNOSIS — Z720 Tobacco use: Secondary | ICD-10-CM | POA: Diagnosis present

## 2014-11-19 DIAGNOSIS — L989 Disorder of the skin and subcutaneous tissue, unspecified: Secondary | ICD-10-CM | POA: Diagnosis not present

## 2014-11-19 DIAGNOSIS — J189 Pneumonia, unspecified organism: Secondary | ICD-10-CM

## 2014-11-19 DIAGNOSIS — F1410 Cocaine abuse, uncomplicated: Secondary | ICD-10-CM | POA: Insufficient documentation

## 2014-11-19 DIAGNOSIS — R262 Difficulty in walking, not elsewhere classified: Secondary | ICD-10-CM

## 2014-11-19 DIAGNOSIS — F1120 Opioid dependence, uncomplicated: Secondary | ICD-10-CM | POA: Diagnosis not present

## 2014-11-19 DIAGNOSIS — M25579 Pain in unspecified ankle and joints of unspecified foot: Secondary | ICD-10-CM

## 2014-11-19 DIAGNOSIS — R0789 Other chest pain: Secondary | ICD-10-CM | POA: Insufficient documentation

## 2014-11-19 DIAGNOSIS — F419 Anxiety disorder, unspecified: Secondary | ICD-10-CM | POA: Insufficient documentation

## 2014-11-19 DIAGNOSIS — S92152A Displaced avulsion fracture (chip fracture) of left talus, initial encounter for closed fracture: Secondary | ICD-10-CM | POA: Diagnosis not present

## 2014-11-19 DIAGNOSIS — Z7189 Other specified counseling: Secondary | ICD-10-CM | POA: Insufficient documentation

## 2014-11-19 DIAGNOSIS — Y92009 Unspecified place in unspecified non-institutional (private) residence as the place of occurrence of the external cause: Secondary | ICD-10-CM | POA: Diagnosis not present

## 2014-11-19 DIAGNOSIS — R0781 Pleurodynia: Secondary | ICD-10-CM

## 2014-11-19 DIAGNOSIS — R531 Weakness: Secondary | ICD-10-CM | POA: Diagnosis not present

## 2014-11-19 DIAGNOSIS — F1910 Other psychoactive substance abuse, uncomplicated: Secondary | ICD-10-CM | POA: Insufficient documentation

## 2014-11-19 DIAGNOSIS — G545 Neuralgic amyotrophy: Secondary | ICD-10-CM | POA: Diagnosis not present

## 2014-11-19 DIAGNOSIS — M25572 Pain in left ankle and joints of left foot: Secondary | ICD-10-CM | POA: Diagnosis present

## 2014-11-19 DIAGNOSIS — T148XXA Other injury of unspecified body region, initial encounter: Secondary | ICD-10-CM

## 2014-11-19 DIAGNOSIS — L988 Other specified disorders of the skin and subcutaneous tissue: Secondary | ICD-10-CM

## 2014-11-19 LAB — BASIC METABOLIC PANEL
ANION GAP: 12 (ref 5–15)
BUN: 8 mg/dL (ref 6–23)
CHLORIDE: 103 mmol/L (ref 96–112)
CO2: 25 mmol/L (ref 19–32)
CREATININE: 0.88 mg/dL (ref 0.50–1.35)
Calcium: 10 mg/dL (ref 8.4–10.5)
GLUCOSE: 88 mg/dL (ref 70–99)
POTASSIUM: 3.6 mmol/L (ref 3.5–5.1)
SODIUM: 140 mmol/L (ref 135–145)

## 2014-11-19 LAB — CBC WITH DIFFERENTIAL/PLATELET
BASOS PCT: 0 % (ref 0–1)
Basophils Absolute: 0 10*3/uL (ref 0.0–0.1)
Eosinophils Absolute: 0.4 10*3/uL (ref 0.0–0.7)
Eosinophils Relative: 4 % (ref 0–5)
HCT: 44.7 % (ref 39.0–52.0)
Hemoglobin: 14.7 g/dL (ref 13.0–17.0)
Lymphocytes Relative: 27 % (ref 12–46)
Lymphs Abs: 3.3 10*3/uL (ref 0.7–4.0)
MCH: 29.6 pg (ref 26.0–34.0)
MCHC: 32.9 g/dL (ref 30.0–36.0)
MCV: 90.1 fL (ref 78.0–100.0)
MONO ABS: 0.9 10*3/uL (ref 0.1–1.0)
Monocytes Relative: 7 % (ref 3–12)
NEUTROS ABS: 7.4 10*3/uL (ref 1.7–7.7)
NEUTROS PCT: 62 % (ref 43–77)
Platelets: 402 10*3/uL — ABNORMAL HIGH (ref 150–400)
RBC: 4.96 MIL/uL (ref 4.22–5.81)
RDW: 13.6 % (ref 11.5–15.5)
WBC: 11.9 10*3/uL — AB (ref 4.0–10.5)

## 2014-11-19 LAB — RAPID URINE DRUG SCREEN, HOSP PERFORMED
AMPHETAMINES: NOT DETECTED
Barbiturates: NOT DETECTED
Benzodiazepines: NOT DETECTED
Cocaine: POSITIVE — AB
Opiates: POSITIVE — AB
TETRAHYDROCANNABINOL: NOT DETECTED

## 2014-11-19 LAB — I-STAT TROPONIN, ED: Troponin i, poc: 0 ng/mL (ref 0.00–0.08)

## 2014-11-19 MED ORDER — SODIUM CHLORIDE 0.9 % IV SOLN
INTRAVENOUS | Status: DC
  Administered 2014-11-20 – 2014-11-21 (×3): via INTRAVENOUS

## 2014-11-19 MED ORDER — MORPHINE SULFATE 4 MG/ML IJ SOLN
4.0000 mg | Freq: Once | INTRAMUSCULAR | Status: AC
  Administered 2014-11-19: 4 mg via INTRAVENOUS

## 2014-11-19 MED ORDER — ONDANSETRON HCL 4 MG/2ML IJ SOLN
4.0000 mg | Freq: Four times a day (QID) | INTRAMUSCULAR | Status: DC | PRN
Start: 2014-11-19 — End: 2014-11-24

## 2014-11-19 MED ORDER — MORPHINE SULFATE 4 MG/ML IJ SOLN
4.0000 mg | Freq: Once | INTRAMUSCULAR | Status: AC
  Administered 2014-11-19: 4 mg via INTRAVENOUS
  Filled 2014-11-19: qty 1

## 2014-11-19 MED ORDER — ENOXAPARIN SODIUM 40 MG/0.4ML ~~LOC~~ SOLN
40.0000 mg | SUBCUTANEOUS | Status: DC
  Administered 2014-11-20 – 2014-11-24 (×4): 40 mg via SUBCUTANEOUS
  Filled 2014-11-19 (×4): qty 0.4

## 2014-11-19 MED ORDER — MORPHINE SULFATE 4 MG/ML IJ SOLN
4.0000 mg | Freq: Once | INTRAMUSCULAR | Status: AC
  Filled 2014-11-19: qty 1

## 2014-11-19 MED ORDER — ONDANSETRON HCL 4 MG PO TABS: 4.0000 mg | ORAL_TABLET | Freq: Four times a day (QID) | ORAL | Status: DC | PRN

## 2014-11-19 MED ORDER — GUAIFENESIN ER 600 MG PO TB12
600.0000 mg | ORAL_TABLET | Freq: Two times a day (BID) | ORAL | Status: DC
Start: 2014-11-19 — End: 2014-11-21
  Administered 2014-11-20: 600 mg via ORAL
  Filled 2014-11-19 (×2): qty 1

## 2014-11-19 MED ORDER — OXYCODONE HCL 5 MG PO TABS
10.0000 mg | ORAL_TABLET | Freq: Four times a day (QID) | ORAL | Status: DC | PRN
  Administered 2014-11-20 (×2): 10 mg via ORAL
  Filled 2014-11-19 (×4): qty 2

## 2014-11-19 NOTE — H&P (Signed)
Date: 11/19/2014               Patient Name:  Kenneth Charles MRN: [id number]  DOB: [birth year] Age / Sex: 34 y.o., male   PCP: Pcp Not In System         Medical Service: Internal Medicine Teaching Service         Attending Physician: Dr. Levert FeinsteinJames M Granfortuna, MD    First Contact: Dr. Jill AlexandersAlexa Charles  Pager: [phone]  Second Contact: Dr. Inocente SallesEjiro Charles  Pager: [phone]       After Hours (After 5p/  First Contact Pager: [phone]  weekends / holidays): Second Contact Pager: [phone]   Chief Complaint: Chest pressure, left foot pain  History of Present Illness: Mr. Kenneth Charles is a 34 year old male with left arm weakness, polysubstance use [cocaine, tobacco, h/o heroin], left calcaneal fracture status post ORIF, left arm weakness of unclear etiology who presents with chest pressure and left foot pain.  This morning, he was watching TV when he noticed onset of chest pressure overlying his left chest with radiation to the shoulder. He has never experienced this kind of pressure-like sensation before and it was worse with breathing - to the point where he could not take deep breaths, only shallow breaths. He took his home oxycodone which he had been prescribed for his leg pain though it did not alleviate his pain. Sometime later, he attempted to use the bathroom, but as he was ambulating towards the bathroom, he fell on his side and was down for some time before he got back up. He does have a walker at home though cannot use it as he is only able to use his right arm. He was recently hospitalized earlier this month and was discharged with follow-up for outpatient physical therapy and neurology for an EMG to discern the cause of his left arm weakness [posterior cord palsy versus brachial plexopathy]. His aunt picked him up from the hospital and took care of him for several days before dropping him off back at his home locally as she felt she could no longer care for him. He has been by himself for  the last few days. At baseline, he experiences "electric shocks" 2-3 times a day around his left foot as well as numbness and tingling bilaterally in his arms though worse on his left arm. He reports using cocaine about a week ago but denies any other illicit drug use or alcohol use; he smokes a pack per day and has done so since the age of 34. Prior to this recent illness, he worked as an Personnel officerelectrician. Otherwise, he denies productive cough, abdominal pain, nausea, vomiting, diarrhea. In the ED, he received morphine 4 mg IV 3; chest x-ray was notable for left perihilar opacity.    Meds: No current facility-administered medications for this encounter.   Current Outpatient Prescriptions  Medication Sig Dispense Refill  . Oxycodone HCl 10 MG TABS Take 1 tablet by mouth every 6 (six) hours as needed (pain).     Marland Kitchen. dicyclomine (BENTYL) 20 MG tablet Take 1 tablet (20 mg total) by mouth 2 (two) times daily. (Patient not taking: Reported on 10/31/2014) 20 tablet 0  . diphenoxylate-atropine (LOMOTIL) 2.5-0.025 MG per tablet Take 2 tablets by mouth 4 (four) times daily as needed for diarrhea or loose stools. (Patient not taking: Reported on 10/31/2014) 30 tablet 0  . loperamide (IMODIUM) 2 MG capsule Take 1 capsule (2 mg total) by mouth 4 (four) times daily as needed for  diarrhea or loose stools. (Patient not taking: Reported on 10/31/2014) 12 capsule 0  . ondansetron (ZOFRAN ODT) 4 MG disintegrating tablet  ODT q4 hours prn nausea/vomit (Patient not taking: Reported on 10/31/2014) 15 tablet 0    Allergies: Allergies as of 11/19/2014  . (No Known Allergies)   Past Medical History  Diagnosis Date  . Asthma   . Anxiety   . Heroin addiction    Past Surgical History  Procedure Laterality Date  . Orif calcaneal fracture Left 10/05/2014   Family History  Problem Relation Age of Onset  . Hypertension Father   . Hypertension Mother    History   Social History  . Marital Status: Single    Spouse Name:  N/A  . Number of Children: N/A  . Years of Education: N/A   Occupational History  . Not on file.   Social History Main Topics  . Smoking status: Current Every Day Smoker -- 0.50 packs/day    Types: Cigarettes  . Smokeless tobacco: Current User  . Alcohol Use: No  . Drug Use: Yes     Comment: heroin use, daily  . Sexual Activity: Not on file   Other Topics Concern  . Not on file   Social History Narrative    Review of Systems: As noted in the history of present illness  Physical Exam: Blood pressure 145/97, pulse 95, temperature 97.9 F (36.6 C), temperature source Oral, resp. rate 15, height  (1.88 m), weight 170 lb (77.111 kg), SpO2 97 %. General: Thin Caucasian male, tired appearing, NAD HEENT: PERRL, EOMI, no scleral icterus, oropharynx clear Cardiac: Tachycardia, systolic ejection murmur best appreciated over the right upper sternal border Pulm: clear to auscultation bilaterally, no wheezes, rales, or rhonchi Abd: soft, nontender, nondistended, BS present Ext: warm and well perfused, no pedal edema, multiple lesions overlying both arms with poor skin healing Neuro: CN II-XII intact, left upper extremity strength testing unable to be performed as he cannot lift his arm against gravity, fasciculations present with effort though right upper extremity and both lower extremities 5 out of 5  Lab results: Basic Metabolic Panel:  Recent Labs  16/10/96 1823  NA 140  K 3.6  CL 103  CO2 25  GLUCOSE 88  BUN 8  CREATININE 0.88  CALCIUM 10.0   CBC:  Recent Labs  11/19/14 1823  WBC 11.9*  NEUTROABS 7.4  HGB 14.7  HCT 44.7  MCV 90.1  PLT 402*   Urine Drug Screen: Drugs of Abuse     Component Value Date/Time   LABOPIA POSITIVE* 11/19/2014 2156   COCAINSCRNUR POSITIVE* 11/19/2014 2156   LABBENZ NONE DETECTED 11/19/2014 2156   AMPHETMU NONE DETECTED 11/19/2014 2156   THCU NONE DETECTED 11/19/2014 2156   LABBARB NONE DETECTED 11/19/2014 2156     Imaging  results:  Dg Chest 2 View  11/19/2014   CLINICAL DATA:  Acute onset of generalized chest pain. Initial encounter.  EXAM: CHEST  2 VIEW  COMPARISON:  Chest radiograph and CTA of the chest performed 10/31/2014  FINDINGS: The lungs are well-aerated. Minimal left perihilar opacity could reflect mild pneumonia. Would correlate with the patient's symptoms. There is no evidence of pleural effusion or pneumothorax.  The heart is normal in size; the mediastinal contour is within normal limits. No acute osseous abnormalities are seen.  IMPRESSION: Minimal left perihilar opacity could reflect mild pneumonia, or could remain within normal limits. Would correlate with the patient's symptoms.   Electronically Signed   By:  Roanna Raider M.D.   On: 11/19/2014 18:15   Dg Ankle Complete Left  11/19/2014   CLINICAL DATA:  Four weeks status post calcaneal surgery. Fell on left ankle today. Initial encounter.  EXAM: LEFT ANKLE COMPLETE - 3+ VIEW  COMPARISON:  None.  FINDINGS: A small osseous fragment is noted arising from the lateral tubercle of the talus. This may reflect an avulsion injury, of indeterminate age.  Visualized hardware along the calcaneus appears grossly unremarkable. Mild degenerative change is seen at the subtalar joint. The ankle mortise appears grossly intact; the interosseous space is within normal limits. No talar tilt or subluxation is seen.  The joint spaces are preserved. No significant soft tissue abnormalities are seen.  IMPRESSION: Small osseous fragment arising from the lateral tubercle of the talus may reflect an avulsion injury, of indeterminate age. Would correlate with the patient's symptoms. Hardware along the calcaneus is grossly unremarkable.   Electronically Signed   By: Roanna Raider M.D.   On: 11/19/2014 18:17   Dg Foot Complete Left  11/19/2014   CLINICAL DATA:  Larey Seat on left foot, 4 weeks status post calcaneal surgery. Initial encounter.  EXAM: LEFT FOOT - COMPLETE 3+ VIEW  COMPARISON:   None.  FINDINGS: A small osseous fragment just posterior to the lateral tubercle of the talus may reflect avulsion injury, of indeterminate age. There is no additional evidence for fracture.  The calcaneal hardware is grossly unremarkable in appearance. Mild degenerative change is noted at the midfoot and at the subtalar joint. A small ankle joint effusion is noted.  No additional soft tissue abnormalities are seen.  IMPRESSION: Small osseous fragment noted just posterior to the lateral tubercle of the talus may reflect avulsion injury, of indeterminate age. No additional evidence for fracture. Calcaneal hardware is unremarkable in appearance. Small ankle joint effusion noted.   Electronically Signed   By: Roanna Raider M.D.   On: 11/19/2014 18:19    Other results: EKG: Reviewed and compared with 10/31/14 Sinus tachycardia Normal axis Q waves in 2, 3, aVF, unchanged from prior  Assessment & Plan by Problem: Chest pressure: Given the pleuritic nature of this symptoms, a pulmonary process is suspected. Chest x-ray infiltrate, leukocytosis is somewhat suggestive of pneumonia though he does not have fever or productive cough which would point towards this diagnosis. In light of his recent fall, rib fracture could certainly cause a similar presentation though no findings on chest x-ray. He does meet SIRS criteria given tachycardia and WBC 12. Given his current cocaine use, ACS cannot be excluded as well..  -Continue guaifenesin 600 milligrams twice daily -Check flu PCR & HIV -Recheck BMET and CBC tomorrow morning -Trend troponins 3 -Repeat EKG tomorrow morning -Repeat chest x-ray tomorrow morning  Left calcaneal fracture status post ORIF: Left foot x-ray notable for small osseous fragment just posterior to the lateral tubercle of the talus which may reflect an avulsion injury the hardware is otherwise unremarkable. Per his report, it appears that he does not have any assistance at home and may require  reevaluation for his eligibility for physical therapy and home services. -Continue home oxycodone 10 mg every 6 hours as needed for pain -Consult PT/OT  History of polysubstance use [cocaine, h/o heroin, tobacco]: As noted in the history of present illness. UDS positive for cocaine which he reports he used 7 days ago though unsure if metabolites would persist for this time interval. Per [place] DEA database, he has been filling oxycodone  from Dr. Ihor Gully [Orthopedics],  most recently 50 tablets on 11/15/14.  -Continue counseling patient against substance use  Left arm weakness of unclear etiology [posterior cord palsy versus brachial plexopathy]: Evaluated by neurology on his last hospitalization. Needed to follow-up for EMG but was unable to do so.  -Consider consulting neurology in the morning  Poor healing skin lesions: He reports that when he went to the ED last month, he had I&D for several lesions. Per chart review, nursing staff had noted multiple track marks and abscesses on his forearms. He admits to picking at them which may be the reason for why they aren't healing well though vasculitis would be another consideration giving his cocaine use. -Continue following  #FEN:  -Diet: Regular  #DVT prophylaxis: Lovenox  #CODE STATUS: FULL CODE  Dispo: Disposition is deferred at this time, awaiting improvement of current medical problems.   The patient does not have a current PCP (Pcp Not In System) and does need an [facility] hospital follow-up appointment after discharge.  The patient does have transportation limitations that hinder transportation to clinic appointments.  Signed: Beather Arbour, MD 11/19/2014, 8:15 PM

## 2014-11-19 NOTE — ED Notes (Signed)
Pt. Stated, I hurt my foot earlier but I think its ok, offered to give him some ice, he refused.

## 2014-11-19 NOTE — ED Provider Notes (Signed)
CSN: [id number]     Arrival date & time 11/19/14  1400 History   First MD Initiated Contact with Patient 11/19/14 1650     Chief Complaint  Patient presents with  . 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 Patient states that he was recently admitted to the hospital because he lost the use of his left upper extremity distal to the elbow. Patient states that he also recently had surgery on his left ankle because of a "shattered heel." Patient was admitted because patient was unable to care for himself and unable to ambulate with crutches given the left upper extremity weakness. Patient states that it was decided that the patient could be discharged to home with outpatient physical therapy and neurology follow-up as long as he had someone to help care for him at his home. Patient states that his caregiver is no longer willing to assist him, and he cannot care for himself. He is requesting assistance and help in getting into the facility as he is not able to ambulate using crutches because of the persistent left upper extremity weakness. Patient states that he has fallen several times. He is concerned that he has reinjured his left ankle. Additionally, he complains of chest pain that started today. He denies any associated fever, chills, shortness of breath, or cough. History of IV drug use, but states that he has not used any recently. He states that he does smoke, does not drink alcohol. There are no aggravating or alleviating factors.  The history is provided by the patient. No language interpreter was used.    Past Medical History  Diagnosis Date  . Asthma   . Anxiety   . Heroin addiction    Past Surgical History  Procedure Laterality Date  . Orif calcaneal  fracture Left 10/05/2014   Family History  Problem Relation Age of Onset  . Hypertension Father   . Hypertension Mother    History  Substance Use Topics  . Smoking status: Current Every Day Smoker -- 0.50 packs/day    Types: Cigarettes  . Smokeless tobacco: Current User  . Alcohol Use: No    Review of Systems  Constitutional: Negative for fever and chills.  Respiratory: Negative for shortness of breath.   Cardiovascular: Positive for chest pain.  Gastrointestinal: Negative for nausea, vomiting, diarrhea and constipation.  Genitourinary: Negative for dysuria.  Musculoskeletal: Positive for arthralgias.  Neurological: Positive for weakness.  All other systems reviewed and are negative.     Allergies  Review of patient's allergies indicates no known allergies.  Home Medications   Prior to Admission medications   Medication Sig Start Date End Date Taking? Authorizing Provider  Oxycodone HCl 10 MG TABS Take 1 tablet by mouth every 6 (six) hours as needed (pain).  10/20/14  Yes Historical Provider, MD  dicyclomine (BENTYL) 20 MG tablet Take 1 tablet (20 mg total) by mouth 2 (two) times daily. Patient not taking: Reported on 10/31/2014 08/04/14   Renne Crigler, PA-C  diphenoxylate-atropine (LOMOTIL) 2.5-0.025 MG per tablet Take 2 tablets by mouth 4 (four) times daily as needed for diarrhea or loose stools. Patient not taking: Reported on 10/31/2014 08/04/14   Renne Crigler, PA-C  loperamide (IMODIUM) 2 MG capsule Take 1 capsule (2 mg total) by mouth 4 (four) times daily as needed for diarrhea or loose stools. Patient not  taking: Reported on 10/31/2014 08/09/14   Gwyneth SproutWhitney Plunkett, MD  ondansetron (ZOFRAN ODT) 4 MG disintegrating tablet 4mg  ODT q4 hours prn nausea/vomit Patient not taking: Reported on 10/31/2014 07/31/14   Mirian MoMatthew Gentry, MD   BP 119/99 mmHg  Pulse 103  Temp(Src) 97.9 F (36.6 C) (Oral)  Resp 18  Ht 6\' 2"  (1.88 m)  Wt 170 lb (77.111 kg)  BMI 21.82 kg/m2  SpO2  98% Physical Exam  Constitutional: He is oriented to person, place, and time. He appears well-developed and well-nourished.  HENT:  Head: Normocephalic and atraumatic.  Eyes: Conjunctivae and EOM are normal. Pupils are equal, round, and reactive to light. Right eye exhibits no discharge. Left eye exhibits no discharge. No scleral icterus.  Neck: Normal range of motion. Neck supple. No JVD present.  Cardiovascular: Normal rate, regular rhythm and normal heart sounds.  Exam reveals no gallop and no friction rub.   No murmur heard. Pulmonary/Chest: Effort normal and breath sounds normal. No respiratory distress. He has no wheezes. He has no rales. He exhibits no tenderness.  Abdominal: Soft. He exhibits no distension and no mass. There is no tenderness. There is no rebound and no guarding.  Musculoskeletal: Normal range of motion. He exhibits no edema or tenderness.  Neurological: He is alert and oriented to person, place, and time.  Skin: Skin is warm and dry.  Psychiatric: He has a normal mood and affect. His behavior is normal. Judgment and thought content normal.  Nursing note and vitals reviewed.   ED Course  Procedures (including critical care time) Results for orders placed or performed during the hospital encounter of 11/19/14  CBC with Differential/Platelet  Result Value Ref Range   WBC 11.9 (H) 4.0 - 10.5 K/uL   RBC 4.96 4.22 - 5.81 MIL/uL   Hemoglobin 14.7 13.0 - 17.0 g/dL   HCT 16.144.7 09.639.0 - 04.552.0 %   MCV 90.1 78.0 - 100.0 fL   MCH 29.6 26.0 - 34.0 pg   MCHC 32.9 30.0 - 36.0 g/dL   RDW 40.913.6 81.111.5 - 91.415.5 %   Platelets 402 (H) 150 - 400 K/uL   Neutrophils Relative % 62 43 - 77 %   Neutro Abs 7.4 1.7 - 7.7 K/uL   Lymphocytes Relative 27 12 - 46 %   Lymphs Abs 3.3 0.7 - 4.0 K/uL   Monocytes Relative 7 3 - 12 %   Monocytes Absolute 0.9 0.1 - 1.0 K/uL   Eosinophils Relative 4 0 - 5 %   Eosinophils Absolute 0.4 0.0 - 0.7 K/uL   Basophils Relative 0 0 - 1 %   Basophils Absolute  0.0 0.0 - 0.1 K/uL  Basic metabolic panel  Result Value Ref Range   Sodium 140 135 - 145 mmol/L   Potassium 3.6 3.5 - 5.1 mmol/L   Chloride 103 96 - 112 mmol/L   CO2 25 19 - 32 mmol/L   Glucose, Bld 88 70 - 99 mg/dL   BUN 8 6 - 23 mg/dL   Creatinine, Ser 7.820.88 0.50 - 1.35 mg/dL   Calcium 95.610.0 8.4 - 21.310.5 mg/dL   GFR calc non Af Amer >90 >90 mL/min   GFR calc Af Amer >90 >90 mL/min   Anion gap 12 5 - 15  I-stat troponin, ED  Result Value Ref Range   Troponin i, poc 0.00 0.00 - 0.08 ng/mL   Comment 3           Dg Chest 2 View  11/19/2014   CLINICAL  DATA:  Acute onset of generalized chest pain. Initial encounter.  EXAM: CHEST  2 VIEW  COMPARISON:  Chest radiograph and CTA of the chest performed 10/31/2014  FINDINGS: The lungs are well-aerated. Minimal left perihilar opacity could reflect mild pneumonia. Would correlate with the patient's symptoms. There is no evidence of pleural effusion or pneumothorax.  The heart is normal in size; the mediastinal contour is within normal limits. No acute osseous abnormalities are seen.  IMPRESSION: Minimal left perihilar opacity could reflect mild pneumonia, or could remain within normal limits. Would correlate with the patient's symptoms.   Electronically Signed   By: Roanna Raider M.D.   On: 11/19/2014 18:15   Dg Forearm Left  10/31/2014   CLINICAL DATA:  Left arm weakness and numbness  EXAM: LEFT FOREARM - 2 VIEW  COMPARISON:  None.  FINDINGS: There is no evidence of fracture or other focal bone lesions. Soft tissues are unremarkable.  IMPRESSION: No acute abnormality noted.   Electronically Signed   By: Alcide Clever M.D.   On: 10/31/2014 09:07   Dg Ankle Complete Left  11/19/2014   CLINICAL DATA:  Four weeks status post calcaneal surgery. Fell on left ankle today. Initial encounter.  EXAM: LEFT ANKLE COMPLETE - 3+ VIEW  COMPARISON:  None.  FINDINGS: A small osseous fragment is noted arising from the lateral tubercle of the talus. This may reflect an  avulsion injury, of indeterminate age.  Visualized hardware along the calcaneus appears grossly unremarkable. Mild degenerative change is seen at the subtalar joint. The ankle mortise appears grossly intact; the interosseous space is within normal limits. No talar tilt or subluxation is seen.  The joint spaces are preserved. No significant soft tissue abnormalities are seen.  IMPRESSION: Small osseous fragment arising from the lateral tubercle of the talus may reflect an avulsion injury, of indeterminate age. Would correlate with the patient's symptoms. Hardware along the calcaneus is grossly unremarkable.   Electronically Signed   By: Roanna Raider M.D.   On: 11/19/2014 18:17   Ct Head Wo Contrast  10/31/2014   CLINICAL DATA:  Acute onset of left arm numbness and tingling, with difference in systolic blood pressure between the arms. Initial encounter.  EXAM: CT HEAD WITHOUT CONTRAST  TECHNIQUE: Contiguous axial images were obtained from the base of the skull through the vertex without intravenous contrast.  COMPARISON:  None.  FINDINGS: There is no evidence of acute infarction, mass lesion, or intra- or extra-axial hemorrhage on CT.  The posterior fossa, including the cerebellum, brainstem and fourth ventricle, is within normal limits. The third and lateral ventricles, and basal ganglia are unremarkable in appearance. The cerebral hemispheres are symmetric in appearance, with normal gray-white differentiation. No mass effect or midline shift is seen.  There is no evidence of fracture; visualized osseous structures are unremarkable in appearance. The visualized portions of the orbits are within normal limits. The paranasal sinuses and mastoid air cells are well-aerated. No significant soft tissue abnormalities are seen.  IMPRESSION: Unremarkable noncontrast CT of the head.   Electronically Signed   By: Roanna Raider M.D.   On: 10/31/2014 03:43   Ct Angio Chest Pe W/cm &/or Wo Cm  10/31/2014   CLINICAL DATA:   Acute onset of epigastric abdominal pain, left arm numbness and tingling, and difference in systolic blood pressure between the arms. Left arm tingling and numbness worsens with deep breaths. Initial encounter.  EXAM: CT ANGIOGRAPHY CHEST WITH CONTRAST  TECHNIQUE: Multidetector CT imaging of the chest  was performed using the standard protocol during bolus administration of intravenous contrast. Multiplanar CT image reconstructions and MIPs were obtained to evaluate the vascular anatomy.  CONTRAST:  80mL OMNIPAQUE IOHEXOL 350 MG/ML SOLN  COMPARISON:  None.  FINDINGS: There is no evidence of aortic dissection. There is no evidence of aneurysmal dilatation. No calcific atherosclerotic disease is seen. The great vessels are patent and unremarkable in appearance.  There is no evidence of pulmonary embolus.  Minimal bibasilar atelectasis is noted. The lungs are otherwise clear. There is no evidence of significant focal consolidation, pleural effusion or pneumothorax. No masses are identified; no abnormal focal contrast enhancement is seen.  The mediastinum is unremarkable in appearance. No mediastinal lymphadenopathy is seen. No pericardial effusion is identified. No axillary lymphadenopathy is seen. The visualized portions of the thyroid gland are unremarkable in appearance.  The visualized portions of the liver and spleen are unremarkable.  No acute osseous abnormalities are seen.  Review of the MIP images confirms the above findings.  IMPRESSION: 1. No evidence of aortic dissection. No evidence of aneurysmal dilatation. No calcific atherosclerotic disease seen. Great vessels patent and unremarkable in appearance. 2. No evidence of pulmonary embolus. 3. Minimal bibasilar atelectasis noted; lungs otherwise clear.   Electronically Signed   By: Roanna Raider M.D.   On: 10/31/2014 03:47   Mr Laqueta Jean NW Contrast  10/31/2014   CLINICAL DATA:  34 year old male with history of IVDU, now with acute inability to flex/extend  left upper extremity with loss of sensation. Concern for possible septic emboli.  EXAM: MRI HEAD WITHOUT AND WITH CONTRAST  TECHNIQUE: Multiplanar, multiecho pulse sequences of the brain and surrounding structures were obtained without and with intravenous contrast.  CONTRAST:  15mL MULTIHANCE GADOBENATE DIMEGLUMINE 529 MG/ML IV SOLN  COMPARISON:  Prior head CT performed earlier on the same day.  FINDINGS: The CSF containing spaces are within normal limits for patient age. No focal parenchymal signal abnormality is identified. No mass lesion, midline shift, or extra-axial fluid collection. Ventricles are normal in size without evidence of hydrocephalus.  No diffusion-weighted signal abnormality is identified to suggest acute intracranial infarct. Gray-white matter differentiation is maintained. Normal flow voids are seen within the intracranial vasculature. No intracranial hemorrhage identified.  No abnormal enhancement identified.  The cervicomedullary junction is normal. Pituitary gland is within normal limits. Pituitary stalk is midline. The globes and optic nerves demonstrate a normal appearance with normal signal intensity.  The bone marrow signal intensity is normal. Calvarium is intact. Visualized upper cervical spine is within normal limits.  Scalp soft tissues are unremarkable.  Paranasal sinuses are clear.  No mastoid effusion.  IMPRESSION: Normal contrast enhanced MRI of the brain with no acute intracranial abnormality identified.   Electronically Signed   By: Rise Mu M.D.   On: 10/31/2014 06:45   Mr Chest W Wo Contrast  11/02/2014   CLINICAL DATA:  Acute onset left upper extremity weakness 10/31/2014. Question brachial plexus abnormality.  EXAM: MRI CHEST WITHOUT AND WITH CONTRAST  TECHNIQUE: Multiplanar, multisequence MR imaging was performed both before and after administration of intravenous contrast.  CONTRAST:  15 ml MULTIHANCE GADOBENATE DIMEGLUMINE 529 MG/ML IV SOLN  COMPARISON:  CT  chest 10/31/2014.  FINDINGS: The brachial plexus is normal in appearance. No lesion compressing the brachial plexus is identified and no mass is seen. All visualized musculature is intact and normal in appearance and demonstrates normal signal. Although not designed to evaluate intrinsic structures of the shoulder, the rotator cuff is  in appears intact and normal. The acromioclavicular joint is unremarkable. All visualized bones demonstrate normal signal. Two small left axillary lymph nodes are incidentally noted. No pathologic lymphadenopathy by CT size criteria seen.  IMPRESSION: Normal examination.  No finding to explain the patient's symptoms.   Electronically Signed   By: Drusilla Kanner M.D.   On: 11/02/2014 08:48   Mr Cervical Spine W Wo Contrast  11/02/2014   CLINICAL DATA:  Left arm weakness  EXAM: MRI CERVICAL SPINE WITHOUT AND WITH CONTRAST  TECHNIQUE: Multiplanar and multiecho pulse sequences of the cervical spine, to include the craniocervical junction and cervicothoracic junction, were obtained according to standard protocol without and with intravenous contrast.  CONTRAST:  15mL MULTIHANCE GADOBENATE DIMEGLUMINE 529 MG/ML IV SOLN  COMPARISON:  Cervical spine CT 02/24/2004  FINDINGS: Normal cervical alignment. Negative for fracture or mass lesion. Bone marrow signal normal.  Cervical spine has normal signal. No cord lesion or cord compression. Craniocervical junction normal.  No significant disc degeneration. Negative for disc protrusion or neural impingement. Negative for spinal stenosis.  IMPRESSION: Normal   Electronically Signed   By: Marlan Palau M.D.   On: 11/02/2014 08:23   Dg Hand 2 View Left  10/31/2014   CLINICAL DATA:  Left hand weakness and numbness  EXAM: LEFT HAND - 2 VIEW  COMPARISON:  None.  FINDINGS: There is no evidence of fracture or dislocation. There is no evidence of arthropathy or other focal bone abnormality. Soft tissues are unremarkable.  IMPRESSION: No acute  abnormality noted.   Electronically Signed   By: Alcide Clever M.D.   On: 10/31/2014 09:08   Dg Chest Port 1 View  10/31/2014   CLINICAL DATA:  Chest pain and paralysis of the left hand.  EXAM: PORTABLE CHEST - 1 VIEW  COMPARISON:  02/24/2012  FINDINGS: The heart size and mediastinal contours are within normal limits. Both lungs are clear. The visualized skeletal structures are unremarkable.  IMPRESSION: Negative portable chest.   Electronically Signed   By: Marnee Spring M.D.   On: 10/31/2014 02:13   Dg Foot Complete Left  11/19/2014   CLINICAL DATA:  Larey Seat on left foot, 4 weeks status post calcaneal surgery. Initial encounter.  EXAM: LEFT FOOT - COMPLETE 3+ VIEW  COMPARISON:  None.  FINDINGS: A small osseous fragment just posterior to the lateral tubercle of the talus may reflect avulsion injury, of indeterminate age. There is no additional evidence for fracture.  The calcaneal hardware is grossly unremarkable in appearance. Mild degenerative change is noted at the midfoot and at the subtalar joint. A small ankle joint effusion is noted.  No additional soft tissue abnormalities are seen.  IMPRESSION: Small osseous fragment noted just posterior to the lateral tubercle of the talus may reflect avulsion injury, of indeterminate age. No additional evidence for fracture. Calcaneal hardware is unremarkable in appearance. Small ankle joint effusion noted.   Electronically Signed   By: Roanna Raider M.D.   On: 11/19/2014 18:19      EKG Interpretation None      MDM   Final diagnoses:  Chest pain  Left arm weakness  Ankle pain, left  Unable to ambulate    Patient with multiple problems. Primarily, he is unable to care for himself because he has no use of the left upper extremity, and has had recent ankle surgery. He is supposed be nonweightbearing, but cannot use crutches because of the left upper extremity weakness. Patient will likely need to be placed in  an inpatient facility for physical therapy.  Additionally, he complains of chest pain started today. Will check labs, x-ray, EKG, and will reassess.  On patient's recent admission he had a negative CT angiogram of the chest, there is no evidence of dissection. He had negative MRI of the head, neck, and chest.  Dr. 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  Patient discussed with Dr. Fayrene Fearing, who recommends social admission given that the patient cannot ambulate.  CXR remarkable for minimal left perihilar opacity which could be with pneumonia or could remain within normal limits.  Patient states that he has had some left sided chest pain, but denies any cough or fever.  I will discuss this with the internal medicine team.  Additionally, plain films of the ankle reveal a small osseous fragment of the lateral tubercle of the talus which could be an avulsion injury of indeterminate age.  7:51 PM Patient discussed with internal medicine teaching service residents, who will admit the patient. Appreciate their continued care. Patient understands and agrees with plan. Will hold treatment of possible pneumonia, anticipate repeat chest x-ray in the morning.  8:59 PM Informed by internal medicine teaching service, the patient did not qualify for admission to a skilled nursing facility on his original admission. There is concern about whether he will qualify now. Patient seen by and discussed with Dr. Fayrene Fearing, who states that his patient is unable to ambulate, and is technically nonweightbearing on the left lower extremity, and cannot use a crutch in the left upper extremity, he will still need to be admitted. I discussed this with the internal medicine teaching residents, who are agreeable with this difficult social admission. Appreciate their help with this situation.   Roxy Horseman, PA-C 11/19/14 1952  Roxy Horseman, PA-C 11/19/14  2100  Rolland Porter, MD 11/22/14 [phone]

## 2014-11-19 NOTE — ED Notes (Signed)
Report attempted 

## 2014-11-19 NOTE — ED Notes (Addendum)
Pt refused blood draw

## 2014-11-19 NOTE — ED Notes (Signed)
Phlebotomy not able to obtain labs x 2 sticks; transport arrived for xray; will attempt IV access on return to room.

## 2014-11-19 NOTE — ED Notes (Signed)
Patient returned from X-ray 

## 2014-11-19 NOTE — ED Notes (Signed)
Patient transported to X-ray 

## 2014-11-19 NOTE — ED Provider Notes (Signed)
Patient discussed with Ivar Drapeob Browning PA. Patient's admission notes and therapy evaluations reviewed. Discussed with internal medicine residents as well. Patient with weakness of the left arm, and recent left calcaneal surgery. PT and OT evaluation as described arm weakness although inconsistent. Also describe nonweightbearing of left leg. They describe guarded ambulation is a recommendation. Patient discharged home with an understanding he would have assistance at home from family. This was agreeable to family discharge. Presents here today. Family unable or unwilling to help him. He has had several falls at home. I feel he needs admission for PT/OT reevaluation until he would he considered safely independent with ambulation and ADLs., vs need for additional assist/short term rehab.  Rolland PorterMark Leiann Sporer, MD 11/19/14 2103

## 2014-11-19 NOTE — ED Notes (Signed)
Pt. Stated, Im having chest pain that started a hour ago.

## 2014-11-20 ENCOUNTER — Observation Stay (HOSPITAL_COMMUNITY): Payer: Medicaid Other

## 2014-11-20 DIAGNOSIS — T148XXA Other injury of unspecified body region, initial encounter: Secondary | ICD-10-CM

## 2014-11-20 DIAGNOSIS — T148 Other injury of unspecified body region: Secondary | ICD-10-CM

## 2014-11-20 DIAGNOSIS — R262 Difficulty in walking, not elsewhere classified: Secondary | ICD-10-CM | POA: Insufficient documentation

## 2014-11-20 DIAGNOSIS — G545 Neuralgic amyotrophy: Secondary | ICD-10-CM

## 2014-11-20 DIAGNOSIS — M25579 Pain in unspecified ankle and joints of unspecified foot: Secondary | ICD-10-CM

## 2014-11-20 DIAGNOSIS — R0789 Other chest pain: Secondary | ICD-10-CM | POA: Insufficient documentation

## 2014-11-20 LAB — INFLUENZA PANEL BY PCR (TYPE A & B)
H1N1FLUPCR: NOT DETECTED
INFLAPCR: NEGATIVE
INFLBPCR: NEGATIVE

## 2014-11-20 LAB — BASIC METABOLIC PANEL
Anion gap: 12 (ref 5–15)
BUN: 11 mg/dL (ref 6–23)
CO2: 23 mmol/L (ref 19–32)
Calcium: 9.2 mg/dL (ref 8.4–10.5)
Chloride: 102 mmol/L (ref 96–112)
Creatinine, Ser: 0.81 mg/dL (ref 0.50–1.35)
GFR calc Af Amer: >90 mL/min (ref >90)
Glucose, Bld: 93 mg/dL (ref 70–99)
Potassium: 4 mmol/L (ref 3.5–5.1)
Sodium: 137 mmol/L (ref 135–145)

## 2014-11-20 LAB — CBC
HCT: 42.6 % (ref 39.0–52.0)
Hemoglobin: 14 g/dL (ref 13.0–17.0)
MCH: 30.6 pg (ref 26.0–34.0)
MCHC: 32.9 g/dL (ref 30.0–36.0)
MCV: 93 fL (ref 78.0–100.0)
PLATELETS: 363 10*3/uL (ref 150–400)
RBC: 4.58 MIL/uL (ref 4.22–5.81)
RDW: 13.8 % (ref 11.5–15.5)
WBC: 9.3 10*3/uL (ref 4.0–10.5)

## 2014-11-20 LAB — TROPONIN I: Troponin I: <0.03 ng/mL (ref <0.031)

## 2014-11-20 MED ORDER — OXYCODONE HCL 5 MG PO TABS
10.0000 mg | ORAL_TABLET | ORAL | Status: DC | PRN
  Administered 2014-11-20 – 2014-11-24 (×21): 10 mg via ORAL
  Filled 2014-11-20 (×20): qty 2

## 2014-11-20 NOTE — Progress Notes (Signed)
Subjective:  Patient was seen and examined this morning. Patient states his aunt could not longer care for him and he has been struggling to take care of himself at home. He has fallen and re-injured his left heel. He states he saw his orthopedist the day of discharge last time to have the sutures taken out and his orthopedist did not report anything concerning regarding his heel. He has not been able to walk well at home. He also developed this left sided chest pain with radiation into the left arm and down to his feet yesterday when he was sitting in a chair. He also admits to shortness of breath with inspiration and expiration, but denies any cough. He does admit to staying cold at home. He denies nausea, vomiting, dizziness. In regards to left arm, patient continues to have weakness with minimal improvement. He has not been able to the see the neurologist.   Objective: Vital signs in last 24 hours: Filed Vitals:   11/19/14 2337 11/19/14 2338 11/20/14 0113 11/20/14 0543  BP: 147/87  146/88 129/79  Pulse: 82  81 95  Temp: 98.4 F (36.9 C)  97.8 F (36.6 C) 98.3 F (36.8 C)  TempSrc: Oral  Axillary Oral  Resp:      Height:      Weight:  174 lb 4.8 oz (79.062 kg)    SpO2: 97%  99% 98%   Weight change:   Intake/Output Summary (Last 24 hours) at 11/20/14 1236 Last data filed at 11/20/14 0547  Gross per 24 hour  Intake      0 ml  Output   1300 ml  Net  -1300 ml   General: Vital signs reviewed.  Patient is well-developed and well-nourished, in no acute distress and cooperative with exam.  Cardiovascular: Tachycardic, regular rhythm. Chest pain not reproducible with palpation.  Pulmonary/Chest: Clear to auscultation bilaterally, no wheezes, rales, or rhonchi. Abdominal: Soft, non-tender, non-distended, BS + Extremities: Left heel with ecchymosis, well healed surgical incision, edema surrounding heel. 2+ pedal pulses. No lower extremity edema bilaterally. No calf tenderness  bilaterally.  Neurological: A&O x3, LUE 3-4/5 with flexion, extension, abduction and adduction. 3-4/5 grip strength. 2+ radial pulses, 2/5 reflexes. Psychiatric: Normal mood and affect. speech and behavior is normal. Cognition and memory are normal.   Lab Results: Basic Metabolic Panel:  Recent Labs Lab 11/19/14 1823 11/20/14 0526  NA 140 137  K 3.6 4.0  CL 103 102  CO2 25 23  GLUCOSE 88 93  BUN 8 11  CREATININE 0.88 0.81  CALCIUM 10.0 9.2   CBC:  Recent Labs Lab 11/19/14 1823 11/20/14 0526  WBC 11.9* 9.3  NEUTROABS 7.4  --   HGB 14.7 14.0  HCT 44.7 42.6  MCV 90.1 93.0  PLT 402* 363   Cardiac Enzymes:  Recent Labs Lab 11/20/14 0526  TROPONINI <0.03   Urine Drug Screen: Drugs of Abuse     Component Value Date/Time   LABOPIA POSITIVE* 11/19/2014 2156   COCAINSCRNUR POSITIVE* 11/19/2014 2156   LABBENZ NONE DETECTED 11/19/2014 2156   AMPHETMU NONE DETECTED 11/19/2014 2156   THCU NONE DETECTED 11/19/2014 2156   LABBARB NONE DETECTED 11/19/2014 2156    Studies/Results: Dg Chest 2 View  11/20/2014   CLINICAL DATA:  Left side chest pain, possible fever, shortness of Breath  EXAM: CHEST  2 VIEW  COMPARISON:  11/19/2014 and 10/31/2014  FINDINGS: Cardiomediastinal silhouette is stable. Mild perihilar increased bronchial markings without convincing focal consolidation. No segmental infiltrate or  pulmonary edema. Bony thorax is stable.  IMPRESSION: Mild perihilar increased bronchial markings without convincing focal consolidation. No segmental infiltrate or pulmonary edema.   Electronically Signed   By: Natasha Mead M.D.   On: 11/20/2014 09:02   Dg Chest 2 View  11/19/2014   CLINICAL DATA:  Acute onset of generalized chest pain. Initial encounter.  EXAM: CHEST  2 VIEW  COMPARISON:  Chest radiograph and CTA of the chest performed 10/31/2014  FINDINGS: The lungs are well-aerated. Minimal left perihilar opacity could reflect mild pneumonia. Would correlate with the patient's  symptoms. There is no evidence of pleural effusion or pneumothorax.  The heart is normal in size; the mediastinal contour is within normal limits. No acute osseous abnormalities are seen.  IMPRESSION: Minimal left perihilar opacity could reflect mild pneumonia, or could remain within normal limits. Would correlate with the patient's symptoms.   Electronically Signed   By: Roanna Raider M.D.   On: 11/19/2014 18:15   Dg Ankle Complete Left  11/19/2014   CLINICAL DATA:  Four weeks status post calcaneal surgery. Fell on left ankle today. Initial encounter.  EXAM: LEFT ANKLE COMPLETE - 3+ VIEW  COMPARISON:  None.  FINDINGS: A small osseous fragment is noted arising from the lateral tubercle of the talus. This may reflect an avulsion injury, of indeterminate age.  Visualized hardware along the calcaneus appears grossly unremarkable. Mild degenerative change is seen at the subtalar joint. The ankle mortise appears grossly intact; the interosseous space is within normal limits. No talar tilt or subluxation is seen.  The joint spaces are preserved. No significant soft tissue abnormalities are seen.  IMPRESSION: Small osseous fragment arising from the lateral tubercle of the talus may reflect an avulsion injury, of indeterminate age. Would correlate with the patient's symptoms. Hardware along the calcaneus is grossly unremarkable.   Electronically Signed   By: Roanna Raider M.D.   On: 11/19/2014 18:17   Dg Foot Complete Left  11/19/2014   CLINICAL DATA:  Larey Seat on left foot, 4 weeks status post calcaneal surgery. Initial encounter.  EXAM: LEFT FOOT - COMPLETE 3+ VIEW  COMPARISON:  None.  FINDINGS: A small osseous fragment just posterior to the lateral tubercle of the talus may reflect avulsion injury, of indeterminate age. There is no additional evidence for fracture.  The calcaneal hardware is grossly unremarkable in appearance. Mild degenerative change is noted at the midfoot and at the subtalar joint. A small ankle  joint effusion is noted.  No additional soft tissue abnormalities are seen.  IMPRESSION: Small osseous fragment noted just posterior to the lateral tubercle of the talus may reflect avulsion injury, of indeterminate age. No additional evidence for fracture. Calcaneal hardware is unremarkable in appearance. Small ankle joint effusion noted.   Electronically Signed   By: Roanna Raider M.D.   On: 11/19/2014 18:19   Medications:  I have reviewed the patient's current medications. Prior to Admission:  Prescriptions prior to admission  Medication Sig Dispense Refill Last Dose  . Oxycodone HCl 10 MG TABS Take 1 tablet by mouth every 6 (six) hours as needed (pain).    11/19/2014 at 700  . dicyclomine (BENTYL) 20 MG tablet Take 1 tablet (20 mg total) by mouth 2 (two) times daily. (Patient not taking: Reported on 10/31/2014) 20 tablet 0 Not Taking at Unknown time  . diphenoxylate-atropine (LOMOTIL) 2.5-0.025 MG per tablet Take 2 tablets by mouth 4 (four) times daily as needed for diarrhea or loose stools. (Patient not taking: Reported  on 10/31/2014) 30 tablet 0 Not Taking at Unknown time  . loperamide (IMODIUM) 2 MG capsule Take 1 capsule (2 mg total) by mouth 4 (four) times daily as needed for diarrhea or loose stools. (Patient not taking: Reported on 10/31/2014) 12 capsule 0 Not Taking at Unknown time  . ondansetron (ZOFRAN ODT) 4 MG disintegrating tablet  ODT q4 hours prn nausea/vomit (Patient not taking: Reported on 10/31/2014) 15 tablet 0 Not Taking at Unknown time   Scheduled Meds: . enoxaparin (LOVENOX) injection  40 mg Subcutaneous Q24H  . guaiFENesin  600 mg Oral BID   Continuous Infusions: . sodium chloride 100 mL/hr at 11/20/14 0150   PRN Meds:.ondansetron **OR** ondansetron (ZOFRAN) IV, oxyCODONE Assessment/Plan: Principal Problem:   Avulsion fracture of bone Active Problems:   Current tobacco use   Polydrug dependence including opioid type drug, episodic abuse   Left arm weakness   Chest  pain   Fall at home   Unable to ambulate   Chest tightness or pressure  Chest Pressure: Pain and pressure have improved-worse with inspiration and expiration. UDS positive for cocaine. Patient denies cough. Chest x-ray showed a minimal left perihilar opacity indicative of a mild pneumonia or within normal limits. Leukocytosis has resolved 11.9>9.3. Afebrile and without cough. Pain is not reproducible with palpation. Poc troponin negative. Troponin negative x 1. EKG without signs of ischemia. Repeat CXR showed mild perihilar increased bronchial markings without convincing focal consolidation. No segmental infiltrate or pulmonary edema. Influenza screen negative.  -Guaifenesin 600 mg BID -HIV pending -Trending troponins  -Repeat EKG  Left calcaneal fracture status post ORIF: Left foot x-ray notable for small osseous fragment just posterior to the lateral tubercle of the talus which may reflect an avulsion injury the hardware is otherwise unremarkable. Patient reports difficultly with ambulation and imbalance. Recently saw his orthopedist who had no concerns over his heel and removed his sutures. Patient is 4 weeks post-op. -Continue home Oxycodone 10 mg Q6H prn pain -Consult PT/OT for SNF placement -Consult to social work for placement  History of Polysubstance Abuse [cocaine, h/o heroin, tobacco]: As noted in the history of present illness. UDS positive for cocaine and opiates.  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 Patient has not been able to follow up with Neurology for EMG. -Follow up outpatient with Neurology -PT/OT  FEN:  -Diet: Regular  DVT/PE ppx: Lovenox  CODE STATUS: FULL CODE  Dispo: Disposition is deferred at this time, awaiting improvement of current medical problems.  Anticipated discharge in approximately 1-2 day(s).   The patient does not have a current PCP (Pcp Not In  System) and does need an [facility] hospital follow-up appointment after discharge.  The patient does have transportation limitations that hinder transportation to clinic appointments.  .Services Needed at time of discharge: Y = Yes, Blank = No PT:   OT:   RN:   Equipment:   Other:       Jill Alexanders, DO PGY-1 Internal Medicine Resident Pager # [phone] 11/20/2014 12:36 PM

## 2014-11-20 NOTE — Evaluation (Signed)
Occupational Therapy Evaluation Patient Details Name: Kenneth Charles MRN: [id number] DOB: Sep 02, 1980 Today's Date: 11/20/2014    History of Present Illness Pt admitted with chest pain.  Pt also with recent history of fall off of deck with L calcaneal ORIF and NWB status with new x-ray showing alvusion fracture. 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  Pt had gone home from hospital with family caring, but they were no longer able to care for him and pt was by himself several days and reports several falls.  PMH also includes polysubstance abuse.   Clinical Impression   Patient with impulsive, unsafe behaviors and has a history of multiple falls at home. Patient very unsafe to discharge home and will benefit from SNF for safety, increased ROM, increased strength, fall prevention. Unable to fully assess ADL tasks secondary to patient defiant and would not listen to therapist regarding OTs role and importance of therapy at this time. Will follow patient acutely to work on overall safety, functional mobility, and BADLs in order for him to safely discharge to venue listed below.    Follow Up Recommendations  SNF;Supervision/Assistance - 24 hour    Equipment Recommendations  3 in 1 bedside comode    Recommendations for Other Services  None at this time   Precautions / Restrictions Precautions Precautions: Fall Precaution Comments: pt keeping Lt arm flexed throughout session  Restrictions Weight Bearing Restrictions: Yes LLE Weight Bearing: Non weight bearing Other Position/Activity Restrictions: Patient guarding/protecting LUE      Mobility Bed Mobility Overal bed mobility: Modified Independent   General bed mobility comments: HOB elevated and slightly impulsive  Transfers - Per PT eval  Overall transfer level: Needs assistance Equipment used: Left platform walker Transfers: Squat Pivot Transfers Sit to Stand: Min guard General transfer comment:  Patient with increased impulsive behavior during transfer, would not listen to therapists recommendations    Balance   Sitting-balance support: Feet supported Sitting balance-Leahy Scale: Good      Per PT eval Standing balance-Leahy Scale: Poor Standing balance comment: requires PFRW     ADL   General ADL Comments: Difficult to fully assess patient's ADL capabilities secondary to his defiant state. Therapist attempted to educate patient on safety and need for OT. Patient yelling at therapist and stating that he does not like the walker or understand how to walk on one leg. Therapist encouraged patient and continued to explain therapy and OTs role. [facility] on safety with squat pivot transfers, patient with increased impulsivity during transfer and poor insight into education.      Vision Vision Assessment?: No apparent visual deficits          Pertinent Vitals/Pain Pain Assessment: 0-10 Pain Score: 8  Pain Location: left heel and arm Pain Descriptors / Indicators: Aching Pain Intervention(s): Monitored during session     Hand Dominance  (writes and eats L handed and everything else with R UE)   Extremity/Trunk Assessment Upper Extremity Assessment Upper Extremity Assessment: LUE deficits/detail LUE Deficits / Details: Unable to fully assess secondary to patient defiant and does not think he needs therapy when OT in room for eval   Lower Extremity Assessment Lower Extremity Assessment: Defer to PT evaluation LLE Deficits / Details: Pt able to wiggle toes minimally and perform df/pf.  Unable to evert/invert. [facility]   Cervical / Trunk Assessment Cervical / Trunk Assessment: Normal   Communication Communication Communication: No difficulties   Cognition  Arousal/Alertness: Awake/alert Behavior During Therapy: Restless;Impulsive;Agitated Overall Cognitive Status: Within Functional Limits for tasks assessed      Exercises    Other Exercises Other Exercises: toe wiggles   Shoulder Instructions      Home Living Family/patient expects to be discharged to:: Skilled nursing facility     Additional Comments: Pt states he did have platform RW at home.      Prior Functioning/Environment Level of Independence: Needs assistance  Gait / Transfers Assistance Needed: Was hopping with platform RW per patient and was having multiple falls     OT Diagnosis: Generalized weakness;Acute pain   OT Problem List: Decreased strength;Decreased knowledge of use of DME or AE;Decreased safety awareness;Decreased activity tolerance;Decreased range of motion;Impaired balance (sitting and/or standing);Pain   OT Treatment/Interventions: Self-care/ADL training;Therapeutic exercise;DME and/or AE instruction;Therapeutic activities;Patient/family education;Balance training;Other (comment)    OT Goals(Current goals can be found in the care plan section) Acute Rehab OT Goals Patient Stated Goal: "I don't understand why I need therapy" OT Goal Formulation: With patient Time For Goal Achievement: 11/27/14 Potential to Achieve Goals: Good ADL Goals Pt Will Perform Grooming: with supervision;standing Pt Will Perform Lower Body Bathing: sit to/from stand;with adaptive equipment;with min assist Pt Will Perform Lower Body Dressing: with min assist;with adaptive equipment;sit to/from stand Pt Will Transfer to Toilet: with supervision;bedside commode;ambulating  OT Frequency: Min 2X/week   Barriers to D/C: Decreased caregiver support          End of Session Activity Tolerance: Treatment limited secondary to agitation Patient left: in bed;with call bell/phone within reach;with nursing/sitter in room   Time: 8295-6213 OT Time Calculation (min): 15 min Charges:  OT General Charges $OT Visit: 1 Procedure OT Evaluation $Initial OT Evaluation Tier I: 1 Procedure G-Codes: OT G-codes **NOT FOR INPATIENT CLASS** Functional Limitation:  Self care Self Care Current Status (Y8657): At least 1 percent but less than 20 percent impaired, limited or restricted Self Care Goal Status (Q4696): At least 1 percent but less than 20 percent impaired, limited or restricted  Laaibah Wartman , MS, OTR/L, CLT Pager: [phone]  11/20/2014, 2:56 PM

## 2014-11-20 NOTE — Discharge Summary (Deleted)
Name: [name] MRN: [id number] DOB: 01-04-1981 34 y.o. PCP: Pcp Not In System  Date of Admission: 11/19/2014  4:48 PM Date of Discharge: 11/21/2014 Attending Physician: Levert Feinstein, MD  Discharge Diagnosis:  Principal Problem:   Avulsion fracture of bone Active Problems:   Current tobacco use   Polydrug dependence including opioid type drug, episodic abuse   Left arm weakness   Fall at home   Unable to ambulate   Chest tightness or pressure   Parsonage-Turner syndrome  Discharge Medications:   Medication List    STOP taking these medications        dicyclomine 20 MG tablet  Commonly known as:  BENTYL     diphenoxylate-atropine 2.5-0.025 MG per tablet  Commonly known as:  LOMOTIL     loperamide 2 MG capsule  Commonly known as:  IMODIUM     ondansetron 4 MG disintegrating tablet  Commonly known as:  ZOFRAN ODT      TAKE these medications        ibuprofen 600 MG tablet  Commonly known as:  ADVIL,MOTRIN  Take 1 tablet (600 mg total) by mouth every 6 (six) hours as needed for moderate pain.     Oxycodone HCl 10 MG Tabs  Take 1 tablet by mouth every 6 (six) hours as needed (pain).        Disposition and follow-up:   Mr.Kenneth Charles was discharged from [facility] in Good condition.  At the hospital follow up visit please address:  1.  Parsonage-Turner Syndrome (LUE weakness): Assess improvement with PT in strength and sensation. Follow up with Neuro for EMG.  Left Calcaneal Fracture s/p ORIF: Please assess healing and pain control. Follow up with Ortho.   2.  Labs / imaging needed at time of follow-up: EMG  3.  Pending labs/ test needing follow-up: None  Follow-up Appointments: Follow-up Information    Follow up with [name], MD.   Specialty:  Neurology   Why:  to schedule an appointment for Neurology follow up   Contact information:   [address] [place] [address] [phone]       Discharge Instructions: Discharge Instructions    Diet - low sodium heart healthy    Complete by:  As directed      Increase activity slowly    Complete by:  As directed            Consultations:  None  Procedures Performed:  Dg Chest 2 View  11/20/2014   CLINICAL DATA:  Left side chest pain, possible fever, shortness of Breath  EXAM: CHEST  2 VIEW  COMPARISON:  11/19/2014 and 10/31/2014  FINDINGS: Cardiomediastinal silhouette is stable. Mild perihilar increased bronchial markings without convincing focal consolidation. No segmental infiltrate or pulmonary edema. Bony thorax is stable.  IMPRESSION: Mild perihilar increased bronchial markings without convincing focal consolidation. No segmental infiltrate or pulmonary edema.   Electronically Signed   By: Natasha Mead M.D.   On: 11/20/2014 09:02   Dg Chest 2 View  11/19/2014   CLINICAL DATA:  Acute onset of generalized chest pain. Initial encounter.  EXAM: CHEST  2 VIEW  COMPARISON:  Chest radiograph and CTA of the chest performed 10/31/2014  FINDINGS: The lungs are well-aerated. Minimal left perihilar opacity could reflect mild pneumonia. Would correlate with the patient's symptoms. There is no evidence of pleural effusion or pneumothorax.  The heart is normal in size; the mediastinal contour is within  normal limits. No acute osseous abnormalities are seen.  IMPRESSION: Minimal left perihilar opacity could reflect mild pneumonia, or could remain within normal limits. Would correlate with the patient's symptoms.   Electronically Signed   By: Roanna Raider M.D.   On: 11/19/2014 18:15   Dg Forearm Left  10/31/2014   CLINICAL DATA:  Left arm weakness and numbness  EXAM: LEFT FOREARM - 2 VIEW  COMPARISON:  None.  FINDINGS: There is no evidence of fracture or other focal bone lesions. Soft tissues are unremarkable.  IMPRESSION: No acute abnormality noted.   Electronically Signed   By: Alcide Clever M.D.   On: 10/31/2014 09:07   Dg Ankle Complete  Left  11/19/2014   CLINICAL DATA:  Four weeks status post calcaneal surgery. Fell on left ankle today. Initial encounter.  EXAM: LEFT ANKLE COMPLETE - 3+ VIEW  COMPARISON:  None.  FINDINGS: A small osseous fragment is noted arising from the lateral tubercle of the talus. This may reflect an avulsion injury, of indeterminate age.  Visualized hardware along the calcaneus appears grossly unremarkable. Mild degenerative change is seen at the subtalar joint. The ankle mortise appears grossly intact; the interosseous space is within normal limits. No talar tilt or subluxation is seen.  The joint spaces are preserved. No significant soft tissue abnormalities are seen.  IMPRESSION: Small osseous fragment arising from the lateral tubercle of the talus may reflect an avulsion injury, of indeterminate age. Would correlate with the patient's symptoms. Hardware along the calcaneus is grossly unremarkable.   Electronically Signed   By: Roanna Raider M.D.   On: 11/19/2014 18:17   Ct Head Wo Contrast  10/31/2014   CLINICAL DATA:  Acute onset of left arm numbness and tingling, with difference in systolic blood pressure between the arms. Initial encounter.  EXAM: CT HEAD WITHOUT CONTRAST  TECHNIQUE: Contiguous axial images were obtained from the base of the skull through the vertex without intravenous contrast.  COMPARISON:  None.  FINDINGS: There is no evidence of acute infarction, mass lesion, or intra- or extra-axial hemorrhage on CT.  The posterior fossa, including the cerebellum, brainstem and fourth ventricle, is within normal limits. The third and lateral ventricles, and basal ganglia are unremarkable in appearance. The cerebral hemispheres are symmetric in appearance, with normal gray-white differentiation. No mass effect or midline shift is seen.  There is no evidence of fracture; visualized osseous structures are unremarkable in appearance. The visualized portions of the orbits are within normal limits. The paranasal  sinuses and mastoid air cells are well-aerated. No significant soft tissue abnormalities are seen.  IMPRESSION: Unremarkable noncontrast CT of the head.   Electronically Signed   By: Roanna Raider M.D.   On: 10/31/2014 03:43   Ct Angio Chest Pe W/cm &/or Wo Cm  10/31/2014   CLINICAL DATA:  Acute onset of epigastric abdominal pain, left arm numbness and tingling, and difference in systolic blood pressure between the arms. Left arm tingling and numbness worsens with deep breaths. Initial encounter.  EXAM: CT ANGIOGRAPHY CHEST WITH CONTRAST  TECHNIQUE: Multidetector CT imaging of the chest was performed using the standard protocol during bolus administration of intravenous contrast. Multiplanar CT image reconstructions and MIPs were obtained to evaluate the vascular anatomy.  CONTRAST:  80mL OMNIPAQUE IOHEXOL 350 MG/ML SOLN  COMPARISON:  None.  FINDINGS: There is no evidence of aortic dissection. There is no evidence of aneurysmal dilatation. No calcific atherosclerotic disease is seen. The great vessels are patent and unremarkable in appearance.  There is no evidence of pulmonary embolus.  Minimal bibasilar atelectasis is noted. The lungs are otherwise clear. There is no evidence of significant focal consolidation, pleural effusion or pneumothorax. No masses are identified; no abnormal focal contrast enhancement is seen.  The mediastinum is unremarkable in appearance. No mediastinal lymphadenopathy is seen. No pericardial effusion is identified. No axillary lymphadenopathy is seen. The visualized portions of the thyroid gland are unremarkable in appearance.  The visualized portions of the liver and spleen are unremarkable.  No acute osseous abnormalities are seen.  Review of the MIP images confirms the above findings.  IMPRESSION: 1. No evidence of aortic dissection. No evidence of aneurysmal dilatation. No calcific atherosclerotic disease seen. Great vessels patent and unremarkable in appearance. 2. No evidence of  pulmonary embolus. 3. Minimal bibasilar atelectasis noted; lungs otherwise clear.   Electronically Signed   By: Roanna Raider M.D.   On: 10/31/2014 03:47   Mr Laqueta Jean ZO Contrast  10/31/2014   CLINICAL DATA:  34 year old male with history of IVDU, now with acute inability to flex/extend left upper extremity with loss of sensation. Concern for possible septic emboli.  EXAM: MRI HEAD WITHOUT AND WITH CONTRAST  TECHNIQUE: Multiplanar, multiecho pulse sequences of the brain and surrounding structures were obtained without and with intravenous contrast.  CONTRAST:  15mL MULTIHANCE GADOBENATE DIMEGLUMINE 529 MG/ML IV SOLN  COMPARISON:  Prior head CT performed earlier on the same day.  FINDINGS: The CSF containing spaces are within normal limits for patient age. No focal parenchymal signal abnormality is identified. No mass lesion, midline shift, or extra-axial fluid collection. Ventricles are normal in size without evidence of hydrocephalus.  No diffusion-weighted signal abnormality is identified to suggest acute intracranial infarct. Gray-white matter differentiation is maintained. Normal flow voids are seen within the intracranial vasculature. No intracranial hemorrhage identified.  No abnormal enhancement identified.  The cervicomedullary junction is normal. Pituitary gland is within normal limits. Pituitary stalk is midline. The globes and optic nerves demonstrate a normal appearance with normal signal intensity.  The bone marrow signal intensity is normal. Calvarium is intact. Visualized upper cervical spine is within normal limits.  Scalp soft tissues are unremarkable.  Paranasal sinuses are clear.  No mastoid effusion.  IMPRESSION: Normal contrast enhanced MRI of the brain with no acute intracranial abnormality identified.   Electronically Signed   By: Rise Mu M.D.   On: 10/31/2014 06:45   Mr Chest W Wo Contrast  11/02/2014   CLINICAL DATA:  Acute onset left upper extremity weakness 10/31/2014.  Question brachial plexus abnormality.  EXAM: MRI CHEST WITHOUT AND WITH CONTRAST  TECHNIQUE: Multiplanar, multisequence MR imaging was performed both before and after administration of intravenous contrast.  CONTRAST:  15 ml MULTIHANCE GADOBENATE DIMEGLUMINE 529 MG/ML IV SOLN  COMPARISON:  CT chest 10/31/2014.  FINDINGS: The brachial plexus is normal in appearance. No lesion compressing the brachial plexus is identified and no mass is seen. All visualized musculature is intact and normal in appearance and demonstrates normal signal. Although not designed to evaluate intrinsic structures of the shoulder, the rotator cuff is in appears intact and normal. The acromioclavicular joint is unremarkable. All visualized bones demonstrate normal signal. Two small left axillary lymph nodes are incidentally noted. No pathologic lymphadenopathy by CT size criteria seen.  IMPRESSION: Normal examination.  No finding to explain the patient's symptoms.   Electronically Signed   By: Drusilla Kanner M.D.   On: 11/02/2014 08:48   Mr Cervical Spine W Wo Contrast  11/02/2014   CLINICAL DATA:  Left arm weakness  EXAM: MRI CERVICAL SPINE WITHOUT AND WITH CONTRAST  TECHNIQUE: Multiplanar and multiecho pulse sequences of the cervical spine, to include the craniocervical junction and cervicothoracic junction, were obtained according to standard protocol without and with intravenous contrast.  CONTRAST:  15mL MULTIHANCE GADOBENATE DIMEGLUMINE 529 MG/ML IV SOLN  COMPARISON:  Cervical spine CT 02/24/2004  FINDINGS: Normal cervical alignment. Negative for fracture or mass lesion. Bone marrow signal normal.  Cervical spine has normal signal. No cord lesion or cord compression. Craniocervical junction normal.  No significant disc degeneration. Negative for disc protrusion or neural impingement. Negative for spinal stenosis.  IMPRESSION: Normal   Electronically Signed   By: Marlan Palau M.D.   On: 11/02/2014 08:23   Dg Hand 2 View  Left  10/31/2014   CLINICAL DATA:  Left hand weakness and numbness  EXAM: LEFT HAND - 2 VIEW  COMPARISON:  None.  FINDINGS: There is no evidence of fracture or dislocation. There is no evidence of arthropathy or other focal bone abnormality. Soft tissues are unremarkable.  IMPRESSION: No acute abnormality noted.   Electronically Signed   By: Alcide Clever M.D.   On: 10/31/2014 09:08   Dg Chest Port 1 View  10/31/2014   CLINICAL DATA:  Chest pain and paralysis of the left hand.  EXAM: PORTABLE CHEST - 1 VIEW  COMPARISON:  02/24/2012  FINDINGS: The heart size and mediastinal contours are within normal limits. Both lungs are clear. The visualized skeletal structures are unremarkable.  IMPRESSION: Negative portable chest.   Electronically Signed   By: Marnee Spring M.D.   On: 10/31/2014 02:13   Dg Foot Complete Left  11/19/2014   CLINICAL DATA:  Larey Seat on left foot, 4 weeks status post calcaneal surgery. Initial encounter.  EXAM: LEFT FOOT - COMPLETE 3+ VIEW  COMPARISON:  None.  FINDINGS: A small osseous fragment just posterior to the lateral tubercle of the talus may reflect avulsion injury, of indeterminate age. There is no additional evidence for fracture.  The calcaneal hardware is grossly unremarkable in appearance. Mild degenerative change is noted at the midfoot and at the subtalar joint. A small ankle joint effusion is noted.  No additional soft tissue abnormalities are seen.  IMPRESSION: Small osseous fragment noted just posterior to the lateral tubercle of the talus may reflect avulsion injury, of indeterminate age. No additional evidence for fracture. Calcaneal hardware is unremarkable in appearance. Small ankle joint effusion noted.   Electronically Signed   By: Roanna Raider M.D.   On: 11/19/2014 18:19   Admission HPI: Mr. Kenneth Charles is a 34 year old male with left arm weakness, polysubstance use [cocaine, tobacco, h/o heroin], left calcaneal fracture status post ORIF, left arm weakness of unclear  etiology who presents with chest pressure and left foot pain.  This morning, he was watching TV when he noticed onset of chest pressure overlying his left chest with radiation to the shoulder. He has never experienced this kind of pressure-like sensation before and it was worse with breathing - to the point where he could not take deep breaths, only shallow breaths. He took his home oxycodone which he had been prescribed for his leg pain though it did not alleviate his pain. Sometime later, he attempted to use the bathroom, but as he was ambulating towards the bathroom, he fell on his side and was down for some time before he got back up. He does have a walker at home though cannot use  it as he is only able to use his right arm. He was recently hospitalized earlier this month and was discharged with follow-up for outpatient physical therapy and neurology for an EMG to discern the cause of his left arm weakness [posterior cord palsy versus brachial plexopathy]. His aunt picked him up from the hospital and took care of him for several days before dropping him off back at his home locally as she felt she could no longer care for him. He has been by himself for the last few days. At baseline, he experiences "electric shocks" 2-3 times a day around his left foot as well as numbness and tingling bilaterally in his arms though worse on his left arm. He reports using cocaine about a week ago but denies any other illicit drug use or alcohol use; he smokes a pack per day and has done so since the age of 34. Prior to this recent illness, he worked as an Personnel officerelectrician. Otherwise, he denies productive cough, abdominal pain, nausea, vomiting, diarrhea. In the ED, he received morphine 4 mg IV 3; chest x-ray was notable for left perihilar opacity.  Hospital Course by problem list: Principal Problem:   Avulsion fracture of bone Active Problems:   Current tobacco use   Polydrug dependence including opioid type drug, episodic  abuse   Left arm weakness   Fall at home   Unable to ambulate   Chest tightness or pressure   Parsonage-Turner syndrome   Left calcaneal fracture status post ORIF: Patient is status post [facility]. Patient reports recent fall at home due to imbalance. Left foot x-ray notable for small osseous fragment just posterior to the lateral tubercle of the talus which may reflect an avulsion injury age indeterminent but the hardware is otherwise unremarkable. Patient reports difficultly with ambulation and imbalance. Recently saw his orthopedist who had no concerns over his heel and removed his sutures. Home medication of Oxycodone 10 mg Q6H prn pain was resumed on discharge. PT/OT recommended SNF placement.  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 Evidence of fasciulations on examination. MRI brain and Xrays of Left arm were normal. Patient was seen by Neurology who thought symptoms were most consistent with brachial plexopathy or isolated posterior cord palsy. Other differentials include Parsonage-Turner Syndrome due to pain at onset. MRI C-Spine and Brachial Plexus normal. Without structural cause, patient was discharged with outpatient physical therapy and outpatient neurology follow up for EMG. Please assess improvement of weakness on follow up and compliance with PT.   MSK Chest Pain: Pain and pressure improved. Pain was worse with movement, palpation and inspiration and expiration. Chest x-ray showed a minimal left perihilar opacity indicative of a mild pneumonia or within normal limits. Leukocytosis has resolved 11.9>9.3. Afebrile and without cough. Poc troponin negative. Troponin negative x 1. EKG without signs of ischemia. Repeat CXR showed mild perihilar increased bronchial markings without convincing focal consolidation. No  segmental infiltrate or pulmonary edema. Influenza screen negative. Repeat EKG NSR. Patient was discharged and managed with ibuprofen 600 mg Q6H prn.   History of Polysubstance Abuse [cocaine, h/o heroin, tobacco]: As noted in the history of present illness. UDS positive for cocaine and opiates. Patient was counseled against substance use.  Discharge Vitals:   BP 129/70 mmHg  Pulse 97  Temp(Src) 98.3 F (36.8 C) (Oral)  Resp 18  Ht 6\' 2"  (1.88 m)  Wt 174 lb 4.8 oz (79.062 kg)  BMI 22.37 kg/m2  SpO2 96%  Signed: Jill Alexanders, DO PGY-1 Internal Medicine Resident Pager # [phone] 11/21/2014 1:37 PM  Services Ordered on Discharge: None Equipment Ordered on Discharge: 3 in 1

## 2014-11-20 NOTE — Progress Notes (Signed)
UR completed 

## 2014-11-20 NOTE — Progress Notes (Signed)
OT Cancellation Note  Patient Details Name: Kenneth DeistChristopher B Charles MRN: [id number] DOB: 12/17/1980   Cancelled Treatment:    Reason Eval/Treat Not Completed: Patient declined, no reason specified. Upon entering room, patient asleep in bed with covers covering head. Patient stated he didn't get any sleep last night and "everyone keeps coming in here, every hour on the hour". Patient refused working with therapist even after encouragement provided. Will re-attempt OT eval/treat as able and time allows.  Dalilah Curlin , MS, OTR/L, CLT Pager: [phone]  11/20/2014, 10:48 AM

## 2014-11-20 NOTE — Evaluation (Signed)
Physical Therapy Evaluation Patient Details Name: Kenneth DeistChristopher B Charles MRN: [id number] DOB: [birth year] Today's Date: 11/20/2014   History of Present Illness  Pt admitted with chest pain.  Pt also with recent history of fall off of deck with L calcaneal ORIF and NWB status with new x-ray showing alvusion fracture. 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  Pt had gone home from hospital with family caring, but they were no longer able to care for him and pt was by himself several days and reports several falls.  PMH also includes polysubstance abuse.  Clinical Impression  Pt admitted with above diagnosis. Pt currently with functional limitations due to the deficits listed below (see PT Problem List).  Pt will benefit from skilled PT to increase their independence and safety with mobility to allow discharge to the venue listed below.  Pt is fearful of returning home by himself as he reports he has had several falls and is scared he is going to get hurt.  Pt is primarily limited by decreased functional use L UE with mobility.  He is able to maintain L NWB despite pain level, but cannot safely maneuver PFRW without A. At this time recommend SNF due to having no one to care for him at home and multiple falls at home.    Follow Up Recommendations SNF    Equipment Recommendations  None recommended by PT    Recommendations for Other Services       Precautions / Restrictions Precautions Precautions: Fall Precaution Comments: pt keeping Lt arm flexed throughout session  Restrictions Weight Bearing Restrictions: Yes LLE Weight Bearing: Non weight bearing      Mobility  Bed Mobility Overal bed mobility: Modified Independent             General bed mobility comments: HOB elevated and slightly impulsive  Transfers Overall transfer level: Needs assistance Equipment used: Left platform walker Transfers: Sit to/from Stand Sit to Stand: Min guard         General  transfer comment: min/guard for steadying due to L NWB and difficulty placing L UE on platform.  A in placing hand on platform but unable to use it functionally.  Ambulation/Gait Ambulation/Gait assistance: Min assist (for A with safe placement of PFRW) Ambulation Distance (Feet): 10 Feet Assistive device: Left platform walker Gait Pattern/deviations: Step-to pattern     General Gait Details: Pt had difficulty maneuvering platform RW and steering it due to L UE weakness.  Walker would end up slightly sideways and needed PT to keep straight.  Good adherence to NWB L LE.  Stairs            Wheelchair Mobility    Modified Rankin (Stroke Patients Only)       Balance   Sitting-balance support: Feet supported Sitting balance-Leahy Scale: Good       Standing balance-Leahy Scale: Poor Standing balance comment: requires PFRW                             Pertinent Vitals/Pain Pain Assessment: 0-10 Pain Score: 8  Pain Location: L heel Pain Descriptors / Indicators: Burning;Throbbing Pain Intervention(s): Monitored during session;Repositioned (elevated on pillows, declined ice)    Home Living Family/patient expects to be discharged to:: Skilled nursing facility                 Additional Comments: Pt states he did have platform RW at home.  Prior Function Level of Independence: Needs assistance   Gait / Transfers Assistance Needed: Was hopping with platform RW per patient and was having multiple falls           Hand Dominance   Dominant Hand:  (writes and eats L handed and everything else with R UE)    Extremity/Trunk Assessment   Upper Extremity Assessment: Defer to OT evaluation           Lower Extremity Assessment: LLE deficits/detail   LLE Deficits / Details: Pt able to wiggle toes minimally and perform df/pf.  Unable to evert/invert. hip and Knee WFL  Cervical / Trunk Assessment: Normal  Communication      Cognition  Arousal/Alertness: Awake/alert Behavior During Therapy: WFL for tasks assessed/performed;Restless Overall Cognitive Status: Within Functional Limits for tasks assessed                      General Comments General comments (skin integrity, edema, etc.): Pt agreeable to work with PT for short session, but not to sit up in recliner.  Appears to be easily agitated.    Exercises General Exercises - Lower Extremity Ankle Circles/Pumps: AROM;Left;10 reps Other Exercises Other Exercises: toe wiggles      Assessment/Plan    PT Assessment Patient needs continued PT services  PT Diagnosis Difficulty walking   PT Problem List Decreased activity tolerance;Pain;Decreased knowledge of use of DME;Decreased safety awareness;Decreased range of motion;Decreased balance;Decreased mobility  PT Treatment Interventions DME instruction;Gait training;Functional mobility training;Therapeutic activities;Therapeutic exercise;Balance training;Patient/family education   PT Goals (Current goals can be found in the Care Plan section) Acute Rehab PT Goals Patient Stated Goal: I can't go home.  I keep falling.  I am going to really get hurt. PT Goal Formulation: With patient Time For Goal Achievement: 12/04/14 Potential to Achieve Goals: Good    Frequency Min 4X/week   Barriers to discharge Decreased caregiver support Has no A at home and has had multiple falls.    Co-evaluation               End of Session Equipment Utilized During Treatment: Gait belt Activity Tolerance: Patient limited by pain Patient left: in bed;with call bell/phone within reach;with bed alarm set Nurse Communication: Mobility status    Functional Assessment Tool Used: clinical judgement Functional Limitation: Mobility: Walking and moving around Mobility: Walking and Moving Around Current Status (J8119): At least 1 percent but less than 20 percent impaired, limited or restricted Mobility: Walking and Moving Around  Goal Status [phone]): At least 1 percent but less than 20 percent impaired, limited or restricted    Time: 1237-1255 PT Time Calculation (min) (ACUTE ONLY): 18 min   Charges:   PT Evaluation $Initial PT Evaluation Tier I: 1 Procedure     PT G Codes:   PT G-Codes **NOT FOR INPATIENT CLASS** Functional Assessment Tool Used: clinical judgement Functional Limitation: Mobility: Walking and moving around Mobility: Walking and Moving Around Current Status (N5621): At least 1 percent but less than 20 percent impaired, limited or restricted Mobility: Walking and Moving Around Goal Status [phone]): At least 1 percent but less than 20 percent impaired, limited or restricted    [facility] LUBECK 11/20/2014, 1:55 PM

## 2014-11-20 NOTE — Progress Notes (Signed)
Pt allowed the lab tech to draw his blood this morning, however she reported to this RN that he was extremely rude to her.

## 2014-11-20 NOTE — Progress Notes (Signed)
Pt has refused to allow the lab tech to draw his blood. States that he is an IV drug user and he wants a PICC line for lab draws.

## 2014-11-21 MED ORDER — IBUPROFEN 200 MG PO TABS
600.0000 mg | ORAL_TABLET | Freq: Four times a day (QID) | ORAL | Status: DC | PRN
  Administered 2014-11-21 (×2): 600 mg via ORAL
  Filled 2014-11-21 (×2): qty 3

## 2014-11-21 MED ORDER — IBUPROFEN 600 MG PO TABS: 600.0000 mg | ORAL_TABLET | Freq: Four times a day (QID) | ORAL | Status: DC | PRN

## 2014-11-21 MED ORDER — NICOTINE 21 MG/24HR TD PT24
21.0000 mg | MEDICATED_PATCH | TRANSDERMAL | Status: DC
  Administered 2014-11-21 – 2014-11-24 (×3): 21 mg via TRANSDERMAL
  Filled 2014-11-21 (×3): qty 1

## 2014-11-21 MED ORDER — NICOTINE 21 MG/24HR TD PT24
21.0000 mg | MEDICATED_PATCH | Freq: Once | TRANSDERMAL | Status: AC
  Administered 2014-11-21: 21 mg via TRANSDERMAL
  Filled 2014-11-21: qty 1

## 2014-11-21 NOTE — Progress Notes (Signed)
Subjective:  Patient was seen and examined this morning. Patient denies any acute complaints. He requests assistance to shower. PT has been helpful for him for arm weakness. He continues to have chest pain that worsens with movement, palpation, and deep inspiration.   Objective: Vital signs in last 24 hours: Filed Vitals:   11/20/14 0543 11/20/14 1300 11/20/14 2057 11/21/14 0552  BP: 129/79 117/70 138/79 129/70  Pulse: 95 97 96 97  Temp: 98.3 F (36.8 C) 99 F (37.2 C) 98.7 F (37.1 C) 98.3 F (36.8 C)  TempSrc: Oral Oral Oral Oral  Resp:  18    Height:      Weight:      SpO2: 98% 98% 96% 96%   Weight change:   Intake/Output Summary (Last 24 hours) at 11/21/14 1326 Last data filed at 11/21/14 0234  Gross per 24 hour  Intake    520 ml  Output   3450 ml  Net  -2930 ml   General: Vital signs reviewed.  Patient is well-developed and well-nourished, in no acute distress and cooperative with exam.  Cardiovascular: Regular rate, regular rhythm. Chest pain reproducible with palpation.  Pulmonary/Chest: Clear to auscultation bilaterally, no wheezes, rales, or rhonchi. Abdominal: Soft, non-tender, non-distended, BS + Extremities: Left heel with ecchymosis, well healed surgical incision, edema surrounding heel. 2+ pedal pulses. Neurological: A&O x3, LUE 3-4/5 with flexion, extension, abduction and adduction. 3-4/5 grip strength. Fasciculations noted, no cogwheel rigidity. Psychiatric: Normal mood and affect. speech and behavior is normal. Cognition and memory are normal.   Lab Results: Basic Metabolic Panel:  Recent Labs Lab 11/19/14 1823 11/20/14 0526  NA 140 137  K 3.6 4.0  CL 103 102  CO2 25 23  GLUCOSE 88 93  BUN 8 11  CREATININE 0.88 0.81  CALCIUM 10.0 9.2   CBC:  Recent Labs Lab 11/19/14 1823 11/20/14 0526  WBC 11.9* 9.3  NEUTROABS 7.4  --   HGB 14.7 14.0  HCT 44.7 42.6  MCV 90.1 93.0  PLT 402* 363   Cardiac Enzymes:  Recent Labs Lab 11/20/14 0526    TROPONINI <0.03   Urine Drug Screen: Drugs of Abuse     Component Value Date/Time   LABOPIA POSITIVE* 11/19/2014 2156   COCAINSCRNUR POSITIVE* 11/19/2014 2156   LABBENZ NONE DETECTED 11/19/2014 2156   AMPHETMU NONE DETECTED 11/19/2014 2156   THCU NONE DETECTED 11/19/2014 2156   LABBARB NONE DETECTED 11/19/2014 2156    Studies/Results: Dg Chest 2 View  11/20/2014   CLINICAL DATA:  Left side chest pain, possible fever, shortness of Breath  EXAM: CHEST  2 VIEW  COMPARISON:  11/19/2014 and 10/31/2014  FINDINGS: Cardiomediastinal silhouette is stable. Mild perihilar increased bronchial markings without convincing focal consolidation. No segmental infiltrate or pulmonary edema. Bony thorax is stable.  IMPRESSION: Mild perihilar increased bronchial markings without convincing focal consolidation. No segmental infiltrate or pulmonary edema.   Electronically Signed   By: Natasha MeadLiviu  Pop M.D.   On: 11/20/2014 09:02   Dg Chest 2 View  11/19/2014   CLINICAL DATA:  Acute onset of generalized chest pain. Initial encounter.  EXAM: CHEST  2 VIEW  COMPARISON:  Chest radiograph and CTA of the chest performed 10/31/2014  FINDINGS: The lungs are well-aerated. Minimal left perihilar opacity could reflect mild pneumonia. Would correlate with the patient's symptoms. There is no evidence of pleural effusion or pneumothorax.  The heart is normal in size; the mediastinal contour is within normal limits. No acute osseous abnormalities are seen.  IMPRESSION: Minimal left perihilar opacity could reflect mild pneumonia, or could remain within normal limits. Would correlate with the patient's symptoms.   Electronically Signed   By: Roanna Raider M.D.   On: 11/19/2014 18:15   Dg Ankle Complete Left  11/19/2014   CLINICAL DATA:  Four weeks status post calcaneal surgery. Fell on left ankle today. Initial encounter.  EXAM: LEFT ANKLE COMPLETE - 3+ VIEW  COMPARISON:  None.  FINDINGS: A small osseous fragment is noted arising from  the lateral tubercle of the talus. This may reflect an avulsion injury, of indeterminate age.  Visualized hardware along the calcaneus appears grossly unremarkable. Mild degenerative change is seen at the subtalar joint. The ankle mortise appears grossly intact; the interosseous space is within normal limits. No talar tilt or subluxation is seen.  The joint spaces are preserved. No significant soft tissue abnormalities are seen.  IMPRESSION: Small osseous fragment arising from the lateral tubercle of the talus may reflect an avulsion injury, of indeterminate age. Would correlate with the patient's symptoms. Hardware along the calcaneus is grossly unremarkable.   Electronically Signed   By: Roanna Raider M.D.   On: 11/19/2014 18:17   Dg Foot Complete Left  11/19/2014   CLINICAL DATA:  Larey Seat on left foot, 4 weeks status post calcaneal surgery. Initial encounter.  EXAM: LEFT FOOT - COMPLETE 3+ VIEW  COMPARISON:  None.  FINDINGS: A small osseous fragment just posterior to the lateral tubercle of the talus may reflect avulsion injury, of indeterminate age. There is no additional evidence for fracture.  The calcaneal hardware is grossly unremarkable in appearance. Mild degenerative change is noted at the midfoot and at the subtalar joint. A small ankle joint effusion is noted.  No additional soft tissue abnormalities are seen.  IMPRESSION: Small osseous fragment noted just posterior to the lateral tubercle of the talus may reflect avulsion injury, of indeterminate age. No additional evidence for fracture. Calcaneal hardware is unremarkable in appearance. Small ankle joint effusion noted.   Electronically Signed   By: Roanna Raider M.D.   On: 11/19/2014 18:19   Medications:  I have reviewed the patient's current medications. Prior to Admission:  Prescriptions prior to admission  Medication Sig Dispense Refill Last Dose  . Oxycodone HCl 10 MG TABS Take 1 tablet by mouth every 6 (six) hours as needed (pain).     11/19/2014 at 700  . dicyclomine (BENTYL) 20 MG tablet Take 1 tablet (20 mg total) by mouth 2 (two) times daily. (Patient not taking: Reported on 10/31/2014) 20 tablet 0 Not Taking at Unknown time  . diphenoxylate-atropine (LOMOTIL) 2.5-0.025 MG per tablet Take 2 tablets by mouth 4 (four) times daily as needed for diarrhea or loose stools. (Patient not taking: Reported on 10/31/2014) 30 tablet 0 Not Taking at Unknown time  . loperamide (IMODIUM) 2 MG capsule Take 1 capsule (2 mg total) by mouth 4 (four) times daily as needed for diarrhea or loose stools. (Patient not taking: Reported on 10/31/2014) 12 capsule 0 Not Taking at Unknown time  . ondansetron (ZOFRAN ODT) 4 MG disintegrating tablet  ODT q4 hours prn nausea/vomit (Patient not taking: Reported on 10/31/2014) 15 tablet 0 Not Taking at Unknown time   Scheduled Meds: . enoxaparin (LOVENOX) injection  40 mg Subcutaneous Q24H  . guaiFENesin  600 mg Oral BID  . nicotine  21 mg Transdermal Q24H  . 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 Repeat EKG showed normal sinus rhythm. Patient refused repeat troponins because he was tired of the needle sticks.  -Ibuprofen 600 mg Q6H prn   Left calcaneal fracture status post ORIF: Left foot x-ray notable for small osseous fragment just posterior to the lateral tubercle of the talus which may reflect an avulsion injury the hardware is otherwise unremarkable. PT/OT is recommending SNF placement. -Continue home Oxycodone  10 mg Q4H prn pain -Consult PT/OT for SNF placement -Consult to social work for placement  History of Polysubstance Abuse [cocaine, h/o heroin, tobacco]: As noted in the history of present illness. UDS positive for cocaine and opiates.  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 Patient has not been able to follow up with Neurology for EMG. PT/OT recommending SNF placement.  -Follow up outpatient with Neurology -PT/OT   FEN:  -Diet: Regular  DVT/PE ppx: Lovenox SQ QD  CODE STATUS: FULL CODE  Dispo: Disposition is deferred at this time, awaiting improvement of current medical problems.  Anticipated discharge in approximately 1-2 day(s).   The patient does not have a current PCP (Pcp Not In System) and does need an [facility] hospital follow-up appointment after discharge.  The patient does have transportation limitations that hinder transportation to clinic appointments.  .Services Needed at time of discharge: Y = Yes, Blank = No PT:   OT:   RN:   Equipment:   Other:       Jill Alexanders, DO PGY-1 Internal Medicine Resident Pager # [phone] 11/21/2014 1:26 PM

## 2014-11-21 NOTE — Clinical Social Work Psychosocial (Signed)
Clinical Social Work Department BRIEF PSYCHOSOCIAL ASSESSMENT 11/21/2014  Patient:  Kenneth Charles, Kenneth Charles     Account Number:  [id number]     Admit date:  11/19/2014  Clinical Social Worker:  Delrae Sawyers  Date/Time:  11/21/2014 11:43 AM  Referred by:  Physician  Date Referred:  11/21/2014 Referred for  SNF Placement   Other Referral:   none.   Interview type:  Patient Other interview type:   none.    PSYCHOSOCIAL DATA Living Status:  ALONE Admitted from facility:   Level of care:   Primary support name:  none. Primary support relationship to patient:  NONE Degree of support available:   Patient reports patient has no support at home or in community.    CURRENT CONCERNS Current Concerns  Post-Acute Placement   Other Concerns:   none.    SOCIAL WORK ASSESSMENT / PLAN CSW received referral for possible SNF placement at time of discharge. CSW met with patient at bedside to discuss discharge disposition. Patient informed CSW patient would be willing to discharge to SNF in [place] or [place]. CSW explained to patient need to fax patient's clinical information to all surrounding counties due to patient's history of IV drug use and lack of health insurance. Patient expressed understanding, but stated patient would not discharge to [facility].    CSW spoke with CSW Surveyor, quantity regarding approval for LOG. CSW to continue to follow and assist with discharge planning needs.   Assessment/plan status:  Psychosocial Support/Ongoing Assessment of Needs Other assessment/ plan:   none.   Information/referral to community resources:   [place], [place], [place], and [place] SNF bed offers.    LOG (Letter of Guarantee)    PATIENT'S/FAMILY'S RESPONSE TO PLAN OF CARE: Patient understanding and agreeable to CSW plan of care. Patient expressed no further questions or concerns at this time.       Lubertha Sayres, [place]  [phone]) Licensed Clinical Social Worker Orthopedics [phone]) and Surgical [phone])

## 2014-11-21 NOTE — Progress Notes (Addendum)
PT Cancellation Note  Patient Details Name: Kenneth DeistChristopher B Charles MRN: [id number] DOB: 1981/08/22   Cancelled Treatment:    Reason Eval/Treat Not Completed: Patient declined stating he got up with nursing earlier to the bathroom and now he is hurting too much to work with PT. He states he would work with PT if he could get something to calm the pain in his foot down.  Offered repositioning leg in bed and he declined.  Spoke with nurse who reports pt has had his pain medicine and it is not time for anymore.  Will check back as schedule permits.  Addendum: Checked on patient later and he stated that he wanted to work with PT and he would if he could get some different pain medicine.  When asked for clarification, he indicated that none of his current prescribed pain meds were enough to help with the pain when he gets up.  Spoke to nursing who stated she had called MD regarding pt request.   Kelsey Edman LUBECK 11/21/2014, 11:26 AM

## 2014-11-21 NOTE — Clinical Social Work Placement (Signed)
Clinical Social Work Department CLINICAL SOCIAL WORK PLACEMENT NOTE 11/21/2014  Patient:  Kenneth Charles,Kenneth Charles  Account Number:  [id number] Admit date:  11/19/2014  Clinical Social Worker:  Mosie EpsteinEMILY S Orlin Kann, LCSWA  Date/time:  11/21/2014 12:03 PM  Clinical Social Work is seeking post-discharge placement for this patient at the following level of care:   SKILLED NURSING   (*CSW will update this form in Epic as items are completed)   11/21/2014  Patient/family provided with [facility] Department of Clinical Social Work's list of facilities offering this level of care within the geographic area requested by the patient (or if unable, by the patient's family).  11/21/2014  Patient/family informed of their freedom to choose among providers that offer the needed level of care, that participate in Medicare, Medicaid or managed care program needed by the patient, have an available bed and are willing to accept the patient.  11/21/2014  Patient/family informed of [facility]' ownership interest in [facility], as well as of the fact that they are under no obligation to receive care at this facility.  PASARR submitted to EDS on 11/21/2014 PASARR number received on 11/21/2014  FL2 transmitted to all facilities in geographic area requested by pt/family on  11/21/2014 FL2 transmitted to all facilities within larger geographic area on 11/21/2014  Patient informed that his/her managed care company has contracts with or will negotiate with  certain facilities, including the following:     Patient/family informed of bed offers received:   Patient chooses bed at  Physician recommends and patient chooses bed at    Patient to be transferred to  on   Patient to be transferred to facility by  Patient and family notified of transfer on  Name of family member notified:    The following physician request were entered in Epic:   Additional Comments:  Lily Kochermily Yareliz Thorstenson, LCSWA  [phone]) Licensed Clinical Social Worker Orthopedics [phone]) and Surgical [phone])

## 2014-11-21 NOTE — Discharge Instructions (Signed)
·   Thank you for allowing us to be involved in your healthcare while [facility] were [facility].   Please note that there have been changes to your home medications.  --> PLEASE LOOK AT YOUR DISCHARGE MEDICATION LIST FOR DETAILS.  Please call your PCP if you have any questions or concerns, or any difficulty getting any of your medications.  Please return to the ER if you have worsening of your symptoms or new severe symptoms arise.  Please follow up with Neurology on discharge. The number is listed in your paper work. [facility].

## 2014-11-22 ENCOUNTER — Encounter (HOSPITAL_COMMUNITY): Payer: Self-pay | Admitting: Orthopedic Surgery

## 2014-11-22 MED ORDER — IPRATROPIUM-ALBUTEROL 0.5-2.5 (3) MG/3ML IN SOLN
3.0000 mL | Freq: Once | RESPIRATORY_TRACT | Status: AC
  Administered 2014-11-22: 3 mL via RESPIRATORY_TRACT
  Filled 2014-11-22: qty 3

## 2014-11-22 MED ORDER — OXYCODONE HCL 15 MG PO TABS: 15.0000 mg | ORAL_TABLET | Freq: Every day | ORAL | Status: DC | PRN

## 2014-11-22 MED ORDER — OXYCODONE HCL 5 MG PO TABS
15.0000 mg | ORAL_TABLET | Freq: Every day | ORAL | Status: DC | PRN
  Administered 2014-11-22 – 2014-11-23 (×2): 15 mg via ORAL
  Filled 2014-11-22 (×3): qty 3

## 2014-11-22 NOTE — Progress Notes (Signed)
Subjective:  Patient was seen and examined this morning. Patient states he was able to work with PT/OT yesterday, but it caused increased pain and swelling in his left foot. He is requesting more pain medication. Weakness in left upper extremity is unchanged. Chest discomfort is intermittent and worse with deep inspiration and movement. He feels as if he can not take a deep breath. Patient would like to go to SNF.   Objective: Vital signs in last 24 hours: Filed Vitals:   11/21/14 0552 11/21/14 1400 11/21/14 2029 11/22/14 0707  BP: 129/70 128/84 138/93 124/79  Pulse: 97 79 79 78  Temp: 98.3 F (36.8 C) 98.3 F (36.8 C) 98.3 F (36.8 C) 98 F (36.7 C)  TempSrc: Oral  Oral Oral  Resp:  Height:      Weight:      SpO2: 96% 98% 99% 98%   Weight change:   Intake/Output Summary (Last 24 hours) at 11/22/14 0950 Last data filed at 11/22/14 0707  Gross per 24 hour  Intake    840 ml  Output   3350 ml  Net  -2510 ml   General: Vital signs reviewed.  Patient is well-developed and well-nourished, in no acute distress and cooperative with exam.  Cardiovascular: Regular rate, regular rhythm. Chest pain reproducible with palpation.  Pulmonary/Chest: Clear to auscultation bilaterally, no wheezes, rales, or rhonchi. Abdominal: Soft, non-tender, non-distended, BS + Extremities: Left heel with ecchymosis, well healed surgical incision, edema surrounding heel. 2+ pedal pulses, tender on palpation. 2+ radial pulses Neurological:LUE 3-4/5 with flexion, extension, abduction and adduction. 3-4/5 grip strength. Fasciculations noted, no cogwheel rigidity.  Psychiatric: Normal mood and affect. speech and behavior is normal. Cognition and memory are normal.   Lab Results: Basic Metabolic Panel:  Recent Labs Lab 11/19/14 1823 11/20/14 0526  NA 140 137  K 3.6 4.0  CL 103 102  CO2 25 23  GLUCOSE 88 93  BUN 8 11  CREATININE 0.88 0.81  CALCIUM 10.0 9.2   CBC:  Recent Labs Lab  11/19/14 1823 11/20/14 0526  WBC 11.9* 9.3  NEUTROABS 7.4  --   HGB 14.7 14.0  HCT 44.7 42.6  MCV 90.1 93.0  PLT 402* 363   Cardiac Enzymes:  Recent Labs Lab 11/20/14 0526  TROPONINI <0.03   Urine Drug Screen: Drugs of Abuse     Component Value Date/Time   LABOPIA POSITIVE* 11/19/2014 2156   COCAINSCRNUR POSITIVE* 11/19/2014 2156   LABBENZ NONE DETECTED 11/19/2014 2156   AMPHETMU NONE DETECTED 11/19/2014 2156   THCU NONE DETECTED 11/19/2014 2156   LABBARB NONE DETECTED 11/19/2014 2156    Studies/Results: No results found. Medications:  I have reviewed the patient's current medications. Prior to Admission:  Prescriptions prior to admission  Medication Sig Dispense Refill Last Dose  . Oxycodone HCl 10 MG TABS Take 1 tablet by mouth every 6 (six) hours as needed (pain).    11/19/2014 at 700  . dicyclomine (BENTYL) 20 MG tablet Take 1 tablet (20 mg total) by mouth 2 (two) times daily. (Patient not taking: Reported on 10/31/2014) 20 tablet 0 Not Taking at Unknown time  . diphenoxylate-atropine (LOMOTIL) 2.5-0.025 MG per tablet Take 2 tablets by mouth 4 (four) times daily as needed for diarrhea or loose stools. (Patient not taking: Reported on 10/31/2014) 30 tablet 0 Not Taking at Unknown time  . loperamide (IMODIUM) 2 MG capsule Take 1 capsule (2 mg total) by mouth 4 (four) times daily as needed for  diarrhea or loose stools. (Patient not taking: Reported on 10/31/2014) 12 capsule 0 Not Taking at Unknown time  . ondansetron (ZOFRAN ODT) 4 MG disintegrating tablet 4mg  ODT q4 hours prn nausea/vomit (Patient not taking: Reported on 10/31/2014) 15 tablet 0 Not Taking at Unknown time   Scheduled Meds: . enoxaparin (LOVENOX) injection  40 mg Subcutaneous Q24H  . ipratropium-albuterol  3 mL Nebulization Once  . nicotine  21 mg Transdermal Q24H  . 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 Repeat EKG showed normal sinus rhythm. Patient feels like his chest is tight and he is not taking deep breaths ("like asthma"). -Ibuprofen 600 mg Q6H prn  -Duoneb treatment  Left calcaneal fracture status post ORIF: Patient notes increased pain and edema after working with PT. Left foot x-ray notable for small osseous fragment just posterior to the lateral tubercle of the talus which may reflect an avulsion injury the hardware is otherwise unremarkable. PT/OT is recommending SNF placement.  -Continue Oxycodone 10 mg Q4H prn pain -Oxycodone 15 mg daily prn before/after PT/OT -PT/OT  -Consult to social work for placement  History of Polysubstance Abuse [cocaine, h/o heroin, tobacco]: As noted in the history of present illness. UDS positive for cocaine and opiates.  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 Patient has not been able to follow up with Neurology for EMG. PT/OT recommending SNF placement.  -Follow up outpatient with Neurology -PT/OT   FEN:  -Diet: Regular  DVT/PE ppx: Lovenox SQ QD  CODE STATUS: FULL CODE  Dispo: Disposition is deferred at this time, awaiting improvement of current medical problems.  Anticipated discharge in approximately 1-2 day(s).   The patient does not have a current PCP (Pcp Not In System) and [facility] after discharge.  The patient does have transportation limitations that hinder  transportation to clinic appointments.  .Services Needed at time of discharge: Y = Yes, Blank = No PT:   OT:   RN:   Equipment:   Other:       Jill AlexandersAlexa Richardson, DO PGY-1 Internal Medicine Resident Pager # [phone] 11/22/2014 9:50 AM

## 2014-11-22 NOTE — Progress Notes (Signed)
UR completed 

## 2014-11-22 NOTE — Clinical Social Work Note (Signed)
CSW received call from MD stating patient now agreeable to [place] county SNF placement. CSW and RN met with patient at bedside. Patient informed CSW patient is agreeable to placement at time of discharge. CSW stated patient would be placed in one of the following cities: [place], [place] / [place], [place] / [place], [place]. Patient expressed understanding and stated patient was agreeable. CSW spoke with Medical Director regarding information above.  CSW made appropriate referral to [facility] in [place], [place]. Admissions liaison for facility received referral and will be discussing patient's clinicals with the facility Director and following up with this CSW on 11/23/2014.  CSW to continue to follow and assist with discharge planning needs.  Kenneth Charles, [employer] [phone]) Licensed Clinical Social Worker Orthopedics [phone]) and Surgical [phone])

## 2014-11-22 NOTE — Discharge Summary (Deleted)
Name: Kenneth DeistChristopher B Charles MRN: [id number] DOB: 26-Feb-1981 34 y.o. PCP: Pcp Not In System  Date of Admission: 11/19/2014  4:48 PM Date of Discharge: 11/22/2014 Attending Physician: Levert FeinsteinJames M Granfortuna, MD  Discharge Diagnosis:  Principal Problem:   Avulsion fracture of bone Active Problems:   Current tobacco use   Polydrug dependence including opioid type drug, episodic abuse   Left arm weakness   Fall at home   Unable to ambulate   Chest tightness or pressure   Parsonage-Turner syndrome  Discharge Medications:   Medication List    STOP taking these medications        dicyclomine 20 MG tablet  Commonly known as:  BENTYL     diphenoxylate-atropine 2.5-0.025 MG per tablet  Commonly known as:  LOMOTIL     loperamide 2 MG capsule  Commonly known as:  IMODIUM     ondansetron 4 MG disintegrating tablet  Commonly known as:  ZOFRAN ODT      TAKE these medications        ibuprofen 600 MG tablet  Commonly known as:  ADVIL,MOTRIN  Take 1 tablet (600 mg total) by mouth every 6 (six) hours as needed for moderate pain.     Oxycodone HCl 10 MG Tabs  Take 1 tablet by mouth every 6 (six) hours as needed (pain).     oxyCODONE 15 MG immediate release tablet  Commonly known as:  ROXICODONE  Take 1 tablet (15 mg total) by mouth daily as needed for severe pain.        Disposition and follow-up:   Mr.Kenneth Charles was [facility] in Good condition.  At the hospital follow up visit please address:  1.  Parsonage-Turner Syndrome (LUE weakness): Assess improvement with PT in strength and sensation. Follow up with Neuro for EMG.  Left Calcaneal Fracture s/p ORIF: Please assess healing and pain control. Follow up with Ortho.   2.  Labs / imaging needed at time of follow-up: EMG  3.  Pending labs/ test needing follow-up: None  Follow-up Appointments: Follow-up Information    Follow up with Anson FretAhern, Antonia B, MD.   Specialty:   Neurology   Why:  to schedule an appointment for Neurology follow up   Contact information:   [address] [place] [address] [phone]       Discharge Instructions: Discharge Instructions    Diet - low sodium heart healthy    Complete by:  As directed      Increase activity slowly    Complete by:  As directed            Consultations:  None  Procedures Performed:  Dg Chest 2 View  11/20/2014   CLINICAL DATA:  Left side chest pain, possible fever, shortness of Breath  EXAM: CHEST  2 VIEW  COMPARISON:  11/19/2014 and 10/31/2014  FINDINGS: Cardiomediastinal silhouette is stable. Mild perihilar increased bronchial markings without convincing focal consolidation. No segmental infiltrate or pulmonary edema. Bony thorax is stable.  IMPRESSION: Mild perihilar increased bronchial markings without convincing focal consolidation. No segmental infiltrate or pulmonary edema.   Electronically Signed   By: Natasha MeadLiviu  Pop M.D.   On: 11/20/2014 09:02   Dg Chest 2 View  11/19/2014   CLINICAL DATA:  Acute onset of generalized chest pain. Initial encounter.  EXAM: CHEST  2 VIEW  COMPARISON:  Chest radiograph and CTA of the chest performed 10/31/2014  FINDINGS: The lungs are well-aerated. Minimal left perihilar  opacity could reflect mild pneumonia. Would correlate with the patient's symptoms. There is no evidence of pleural effusion or pneumothorax.  The heart is normal in size; the mediastinal contour is within normal limits. No acute osseous abnormalities are seen.  IMPRESSION: Minimal left perihilar opacity could reflect mild pneumonia, or could remain within normal limits. Would correlate with the patient's symptoms.   Electronically Signed   By: Roanna Raider M.D.   On: 11/19/2014 18:15   Dg Forearm Left  10/31/2014   CLINICAL DATA:  Left arm weakness and numbness  EXAM: LEFT FOREARM - 2 VIEW  COMPARISON:  None.  FINDINGS: There is no evidence of fracture or other focal bone lesions. Soft  tissues are unremarkable.  IMPRESSION: No acute abnormality noted.   Electronically Signed   By: Alcide Clever M.D.   On: 10/31/2014 09:07   Dg Ankle Complete Left  11/19/2014   CLINICAL DATA:  Four weeks status post calcaneal surgery. Fell on left ankle today. Initial encounter.  EXAM: LEFT ANKLE COMPLETE - 3+ VIEW  COMPARISON:  None.  FINDINGS: A small osseous fragment is noted arising from the lateral tubercle of the talus. This may reflect an avulsion injury, of indeterminate age.  Visualized hardware along the calcaneus appears grossly unremarkable. Mild degenerative change is seen at the subtalar joint. The ankle mortise appears grossly intact; the interosseous space is within normal limits. No talar tilt or subluxation is seen.  The joint spaces are preserved. No significant soft tissue abnormalities are seen.  IMPRESSION: Small osseous fragment arising from the lateral tubercle of the talus may reflect an avulsion injury, of indeterminate age. Would correlate with the patient's symptoms. Hardware along the calcaneus is grossly unremarkable.   Electronically Signed   By: Roanna Raider M.D.   On: 11/19/2014 18:17   Ct Head Wo Contrast  10/31/2014   CLINICAL DATA:  Acute onset of left arm numbness and tingling, with difference in systolic blood pressure between the arms. Initial encounter.  EXAM: CT HEAD WITHOUT CONTRAST  TECHNIQUE: Contiguous axial images were obtained from the base of the skull through the vertex without intravenous contrast.  COMPARISON:  None.  FINDINGS: There is no evidence of acute infarction, mass lesion, or intra- or extra-axial hemorrhage on CT.  The posterior fossa, including the cerebellum, brainstem and fourth ventricle, is within normal limits. The third and lateral ventricles, and basal ganglia are unremarkable in appearance. The cerebral hemispheres are symmetric in appearance, with normal gray-white differentiation. No mass effect or midline shift is seen.  There is no  evidence of fracture; visualized osseous structures are unremarkable in appearance. The visualized portions of the orbits are within normal limits. The paranasal sinuses and mastoid air cells are well-aerated. No significant soft tissue abnormalities are seen.  IMPRESSION: Unremarkable noncontrast CT of the head.   Electronically Signed   By: Roanna Raider M.D.   On: 10/31/2014 03:43   Ct Angio Chest Pe W/cm &/or Wo Cm  10/31/2014   CLINICAL DATA:  Acute onset of epigastric abdominal pain, left arm numbness and tingling, and difference in systolic blood pressure between the arms. Left arm tingling and numbness worsens with deep breaths. Initial encounter.  EXAM: CT ANGIOGRAPHY CHEST WITH CONTRAST  TECHNIQUE: Multidetector CT imaging of the chest was performed using the standard protocol during bolus administration of intravenous contrast. Multiplanar CT image reconstructions and MIPs were obtained to evaluate the vascular anatomy.  CONTRAST:  80mL OMNIPAQUE IOHEXOL 350 MG/ML SOLN  COMPARISON:  None.  FINDINGS: There is no evidence of aortic dissection. There is no evidence of aneurysmal dilatation. No calcific atherosclerotic disease is seen. The great vessels are patent and unremarkable in appearance.  There is no evidence of pulmonary embolus.  Minimal bibasilar atelectasis is noted. The lungs are otherwise clear. There is no evidence of significant focal consolidation, pleural effusion or pneumothorax. No masses are identified; no abnormal focal contrast enhancement is seen.  The mediastinum is unremarkable in appearance. No mediastinal lymphadenopathy is seen. No pericardial effusion is identified. No axillary lymphadenopathy is seen. The visualized portions of the thyroid gland are unremarkable in appearance.  The visualized portions of the liver and spleen are unremarkable.  No acute osseous abnormalities are seen.  Review of the MIP images confirms the above findings.  IMPRESSION: 1. No evidence of aortic  dissection. No evidence of aneurysmal dilatation. No calcific atherosclerotic disease seen. Great vessels patent and unremarkable in appearance. 2. No evidence of pulmonary embolus. 3. Minimal bibasilar atelectasis noted; lungs otherwise clear.   Electronically Signed   By: Roanna RaiderJeffery  Chang M.D.   On: 10/31/2014 03:47   Mr Laqueta JeanBrain W GNWo Contrast  10/31/2014   CLINICAL DATA:  34 year old male with history of IVDU, now with acute inability to flex/extend left upper extremity with loss of sensation. Concern for possible septic emboli.  EXAM: MRI HEAD WITHOUT AND WITH CONTRAST  TECHNIQUE: Multiplanar, multiecho pulse sequences of the brain and surrounding structures were obtained without and with intravenous contrast.  CONTRAST:  15mL MULTIHANCE GADOBENATE DIMEGLUMINE 529 MG/ML IV SOLN  COMPARISON:  Prior head CT performed earlier on the same day.  FINDINGS: The CSF containing spaces are within normal limits for patient age. No focal parenchymal signal abnormality is identified. No mass lesion, midline shift, or extra-axial fluid collection. Ventricles are normal in size without evidence of hydrocephalus.  No diffusion-weighted signal abnormality is identified to suggest acute intracranial infarct. Gray-white matter differentiation is maintained. Normal flow voids are seen within the intracranial vasculature. No intracranial hemorrhage identified.  No abnormal enhancement identified.  The cervicomedullary junction is normal. Pituitary gland is within normal limits. Pituitary stalk is midline. The globes and optic nerves demonstrate a normal appearance with normal signal intensity.  The bone marrow signal intensity is normal. Calvarium is intact. Visualized upper cervical spine is within normal limits.  Scalp soft tissues are unremarkable.  Paranasal sinuses are clear.  No mastoid effusion.  IMPRESSION: Normal contrast enhanced MRI of the brain with no acute intracranial abnormality identified.   Electronically Signed   By:  Rise MuBenjamin  McClintock M.D.   On: 10/31/2014 06:45   Mr Chest W Wo Contrast  11/02/2014   CLINICAL DATA:  Acute onset left upper extremity weakness 10/31/2014. Question brachial plexus abnormality.  EXAM: MRI CHEST WITHOUT AND WITH CONTRAST  TECHNIQUE: Multiplanar, multisequence MR imaging was performed both before and after administration of intravenous contrast.  CONTRAST:  15 ml MULTIHANCE GADOBENATE DIMEGLUMINE 529 MG/ML IV SOLN  COMPARISON:  CT chest 10/31/2014.  FINDINGS: The brachial plexus is normal in appearance. No lesion compressing the brachial plexus is identified and no mass is seen. All visualized musculature is intact and normal in appearance and demonstrates normal signal. Although not designed to evaluate intrinsic structures of the shoulder, the rotator cuff is in appears intact and normal. The acromioclavicular joint is unremarkable. All visualized bones demonstrate normal signal. Two small left axillary lymph nodes are incidentally noted. No pathologic lymphadenopathy by CT size criteria seen.  IMPRESSION: Normal examination.  No finding to explain the patient's symptoms.   Electronically Signed   By: Drusilla Kanner M.D.   On: 11/02/2014 08:48   Mr Cervical Spine W Wo Contrast  11/02/2014   CLINICAL DATA:  Left arm weakness  EXAM: MRI CERVICAL SPINE WITHOUT AND WITH CONTRAST  TECHNIQUE: Multiplanar and multiecho pulse sequences of the cervical spine, to include the craniocervical junction and cervicothoracic junction, were obtained according to standard protocol without and with intravenous contrast.  CONTRAST:  15mL MULTIHANCE GADOBENATE DIMEGLUMINE 529 MG/ML IV SOLN  COMPARISON:  Cervical spine CT 02/24/2004  FINDINGS: Normal cervical alignment. Negative for fracture or mass lesion. Bone marrow signal normal.  Cervical spine has normal signal. No cord lesion or cord compression. Craniocervical junction normal.  No significant disc degeneration. Negative for disc protrusion or neural  impingement. Negative for spinal stenosis.  IMPRESSION: Normal   Electronically Signed   By: Marlan Palau M.D.   On: 11/02/2014 08:23   Dg Hand 2 View Left  10/31/2014   CLINICAL DATA:  Left hand weakness and numbness  EXAM: LEFT HAND - 2 VIEW  COMPARISON:  None.  FINDINGS: There is no evidence of fracture or dislocation. There is no evidence of arthropathy or other focal bone abnormality. Soft tissues are unremarkable.  IMPRESSION: No acute abnormality noted.   Electronically Signed   By: Alcide Clever M.D.   On: 10/31/2014 09:08   Dg Chest Port 1 View  10/31/2014   CLINICAL DATA:  Chest pain and paralysis of the left hand.  EXAM: PORTABLE CHEST - 1 VIEW  COMPARISON:  02/24/2012  FINDINGS: The heart size and mediastinal contours are within normal limits. Both lungs are clear. The visualized skeletal structures are unremarkable.  IMPRESSION: Negative portable chest.   Electronically Signed   By: Marnee Spring M.D.   On: 10/31/2014 02:13   Dg Foot Complete Left  11/19/2014   CLINICAL DATA:  Larey Seat on left foot, 4 weeks status post calcaneal surgery. Initial encounter.  EXAM: LEFT FOOT - COMPLETE 3+ VIEW  COMPARISON:  None.  FINDINGS: A small osseous fragment just posterior to the lateral tubercle of the talus may reflect avulsion injury, of indeterminate age. There is no additional evidence for fracture.  The calcaneal hardware is grossly unremarkable in appearance. Mild degenerative change is noted at the midfoot and at the subtalar joint. A small ankle joint effusion is noted.  No additional soft tissue abnormalities are seen.  IMPRESSION: Small osseous fragment noted just posterior to the lateral tubercle of the talus may reflect avulsion injury, of indeterminate age. No additional evidence for fracture. Calcaneal hardware is unremarkable in appearance. Small ankle joint effusion noted.   Electronically Signed   By: Roanna Raider M.D.   On: 11/19/2014 18:19   Admission HPI: Mr. Gartin is a 34 year old  male with left arm weakness, polysubstance use [cocaine, tobacco, h/o heroin], left calcaneal fracture status post ORIF, left arm weakness of unclear etiology who presents with chest pressure and left foot pain.  This morning, he was watching TV when he noticed onset of chest pressure overlying his left chest with radiation to the shoulder. He has never experienced this kind of pressure-like sensation before and it was worse with breathing - to the point where he could not take deep breaths, only shallow breaths. He took his home oxycodone which he had been prescribed for his leg pain though it did not alleviate his pain. Sometime later, he attempted to use the bathroom, but as  he was ambulating towards the bathroom, he fell on his side and was down for some time before he got back up. He does have a walker at home though cannot use it as he is only able to use his right arm. He was recently hospitalized earlier this month and was discharged with follow-up for outpatient physical therapy and neurology for an EMG to discern the cause of his left arm weakness [posterior cord palsy versus brachial plexopathy]. His aunt picked him up from the hospital and took care of him for several days before dropping him off back at his home locally as she felt she could no longer care for him. He has been by himself for the last few days. At baseline, he experiences "electric shocks" 2-3 times a day around his left foot as well as numbness and tingling bilaterally in his arms though worse on his left arm. He reports using cocaine about a week ago but denies any other illicit drug use or alcohol use; he smokes a pack per day and has done so since the age of 52. Prior to this recent illness, he worked as an Personnel officer. Otherwise, he denies productive cough, abdominal pain, nausea, vomiting, diarrhea. In the ED, he received morphine 4 mg IV 3; chest x-ray was notable for left perihilar opacity.  Hospital Course by problem  list: Principal Problem:   Avulsion fracture of bone Active Problems:   Current tobacco use   Polydrug dependence including opioid type drug, episodic abuse   Left arm weakness   Fall at home   Unable to ambulate   Chest tightness or pressure   Parsonage-Turner syndrome   Left calcaneal fracture status post ORIF: Patient is status post ORIF done at [facility] 4 weeks ago. Patient reports recent fall at home due to imbalance. Left foot x-ray notable for small osseous fragment just posterior to the lateral tubercle of the talus which may reflect an avulsion injury age indeterminent but the hardware is otherwise unremarkable. Patient reports difficultly with ambulation and imbalance. Recently saw his orthopedist who had no concerns over his heel and removed his sutures. Home medication of Oxycodone 10 mg Q6H prn pain was resumed on discharge. PT/OT recommended SNF placement.  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 Evidence of fasciulations on examination. MRI brain and Xrays of Left arm were normal. Patient was seen by Neurology who thought symptoms were most consistent with brachial plexopathy or isolated posterior cord palsy. Other differentials include Parsonage-Turner Syndrome due to pain at onset. MRI C-Spine and Brachial Plexus normal. Without structural cause, patient was discharged with outpatient physical therapy and outpatient neurology follow up for EMG. Please assess improvement of weakness on follow up and compliance with PT.   MSK Chest Pain: Pain and pressure improved. Pain was worse with movement, palpation and inspiration and expiration. Chest x-ray showed a minimal left perihilar opacity indicative of a mild pneumonia or within normal limits. Leukocytosis has resolved 11.9>9.3. Afebrile and without cough. Poc troponin negative.  Troponin negative x 1. EKG without signs of ischemia. Repeat CXR showed mild perihilar increased bronchial markings without convincing focal consolidation. No segmental infiltrate or pulmonary edema. Influenza screen negative. Repeat EKG NSR. Patient was discharged and managed with ibuprofen 600 mg Q6H prn.   History of Polysubstance Abuse [cocaine, h/o heroin, tobacco]: As noted in the history of present  illness. UDS positive for cocaine and opiates. Patient was counseled against substance use.  Discharge Vitals:   BP 124/79 mmHg  Pulse 78  Temp(Src) 98 F (36.7 C) (Oral)  Resp 16  Ht  (1.88 m)  Wt 174 lb 4.8 oz (79.062 kg)  BMI 22.37 kg/m2  SpO2 98%  Signed: Jill Alexanders, DO PGY-1 Internal Medicine Resident Pager # [phone] 11/22/2014 9:50 AM  Services Ordered on Discharge: None Equipment Ordered on Discharge: 3 in 1

## 2014-11-22 NOTE — Clinical Social Work Note (Signed)
CSW and CSW ChiropodistAssistant Director attempted to provide patient with SNF bed offer. Patient actively refusing [place] of [place] [facility] [place]. Due to patient's social history (current drug use), behaviors (verbal communication with staff) while at [facility], and lack of health insurance... patient's bed choices are limited to outside of [place] and [facility]. Patient currently refusing SNF placement, refusing to work with PT, and per MD is medically stable for discharge.  MD updated regarding information above. CSW signing off. Thank you.  Marcelline Deistmily Dabney Schanz, LCSWA [phone]) Licensed Clinical Social Worker Orthopedics [phone]([id number]) and Surgical [phone]([id number])

## 2014-11-22 NOTE — Progress Notes (Signed)
PT Cancellation Note  Patient Details Name: Kenneth DeistChristopher B Charles MRN: [id number] DOB: Jul 11, 1981   Cancelled Treatment:    Reason Eval/Treat Not Completed: Pain limiting ability to participate. Pt continues to c/o 10/10 pain. Pt stating "if i dont get something stronger, i will tear this room apart". RN made aware. Pt also stating he will not go to SNF unless [place]. Will check in on pt at next available time. Discussed importance of OOB activity with pt.   Donnamarie [place], [place], [place]  [phone] 11/22/2014, 10:39 AM

## 2014-11-23 ENCOUNTER — Encounter (HOSPITAL_COMMUNITY): Payer: Self-pay | Admitting: *Deleted

## 2014-11-23 NOTE — Discharge Summary (Signed)
Name: Kenneth Charles MRN: [id number] DOB: 12-21-1980 34 y.o. PCP: Pcp Not In System  Date of Admission: 11/19/2014  4:48 PM Date of Discharge: 11/23/2014 Attending Physician: Levert Feinstein, MD  Discharge Diagnosis:  Principal Problem:   Avulsion fracture of bone Active Problems:   Current tobacco use   Polydrug dependence including opioid type drug, episodic abuse   Left arm weakness   Fall at home   Unable to ambulate   Chest tightness or pressure   Parsonage-Turner syndrome  Discharge Medications:   Medication List    STOP taking these medications        dicyclomine 20 MG tablet  Commonly known as:  BENTYL     diphenoxylate-atropine 2.5-0.025 MG per tablet  Commonly known as:  LOMOTIL     loperamide 2 MG capsule  Commonly known as:  IMODIUM     ondansetron 4 MG disintegrating tablet  Commonly known as:  ZOFRAN ODT      TAKE these medications        ibuprofen 600 MG tablet  Commonly known as:  ADVIL,MOTRIN  Take 1 tablet (600 mg total) by mouth every 6 (six) hours as needed for moderate pain.     Oxycodone HCl 10 MG Tabs  Take 1 tablet by mouth every 6 (six) hours as needed (pain).     oxyCODONE 15 MG immediate release tablet  Commonly known as:  ROXICODONE  Take 1 tablet (15 mg total) by mouth daily as needed for severe pain.        Disposition and follow-up:   Kenneth Charles was discharged from [facility] in Good condition.  At the hospital follow up visit please address:  1.  Parsonage-Turner Syndrome (LUE weakness): Assess improvement with PT in strength and sensation. Follow up with Neuro for EMG.  Left Calcaneal Fracture s/p ORIF: Please assess healing and pain control. Follow up with Ortho.   2.  Labs / imaging needed at time of follow-up: EMG  3.  Pending labs/ test needing follow-up: None  Follow-up Appointments: Follow-up Information    Follow up with [name], MD.   Specialty:   Neurology   Why:  to schedule an appointment for Neurology follow up   Contact information:   [address] [place] [address] [phone]       Discharge Instructions: Discharge Instructions    Diet - low sodium heart healthy    Complete by:  As directed      Increase activity slowly    Complete by:  As directed            Consultations:  None  Procedures Performed:  Dg Chest 2 View  11/20/2014   CLINICAL DATA:  Left side chest pain, possible fever, shortness of Breath  EXAM: CHEST  2 VIEW  COMPARISON:  11/19/2014 and 10/31/2014  FINDINGS: Cardiomediastinal silhouette is stable. Mild perihilar increased bronchial markings without convincing focal consolidation. No segmental infiltrate or pulmonary edema. Bony thorax is stable.  IMPRESSION: Mild perihilar increased bronchial markings without convincing focal consolidation. No segmental infiltrate or pulmonary edema.   Electronically Signed   By: Natasha Mead M.D.   On: 11/20/2014 09:02   Dg Chest 2 View  11/19/2014   CLINICAL DATA:  Acute onset of generalized chest pain. Initial encounter.  EXAM: CHEST  2 VIEW  COMPARISON:  Chest radiograph and CTA of the chest performed 10/31/2014  FINDINGS: The lungs are well-aerated. Minimal left perihilar  opacity could reflect mild pneumonia. Would correlate with the patient's symptoms. There is no evidence of pleural effusion or pneumothorax.  The heart is normal in size; the mediastinal contour is within normal limits. No acute osseous abnormalities are seen.  IMPRESSION: Minimal left perihilar opacity could reflect mild pneumonia, or could remain within normal limits. Would correlate with the patient's symptoms.   Electronically Signed   By: Roanna Raider M.D.   On: 11/19/2014 18:15   Dg Forearm Left  10/31/2014   CLINICAL DATA:  Left arm weakness and numbness  EXAM: LEFT FOREARM - 2 VIEW  COMPARISON:  None.  FINDINGS: There is no evidence of fracture or other focal bone lesions. Soft  tissues are unremarkable.  IMPRESSION: No acute abnormality noted.   Electronically Signed   By: Alcide Clever M.D.   On: 10/31/2014 09:07   Dg Ankle Complete Left  11/19/2014   CLINICAL DATA:  Four weeks status post calcaneal surgery. Fell on left ankle today. Initial encounter.  EXAM: LEFT ANKLE COMPLETE - 3+ VIEW  COMPARISON:  None.  FINDINGS: A small osseous fragment is noted arising from the lateral tubercle of the talus. This may reflect an avulsion injury, of indeterminate age.  Visualized hardware along the calcaneus appears grossly unremarkable. Mild degenerative change is seen at the subtalar joint. The ankle mortise appears grossly intact; the interosseous space is within normal limits. No talar tilt or subluxation is seen.  The joint spaces are preserved. No significant soft tissue abnormalities are seen.  IMPRESSION: Small osseous fragment arising from the lateral tubercle of the talus may reflect an avulsion injury, of indeterminate age. Would correlate with the patient's symptoms. Hardware along the calcaneus is grossly unremarkable.   Electronically Signed   By: Roanna Raider M.D.   On: 11/19/2014 18:17   Ct Head Wo Contrast  10/31/2014   CLINICAL DATA:  Acute onset of left arm numbness and tingling, with difference in systolic blood pressure between the arms. Initial encounter.  EXAM: CT HEAD WITHOUT CONTRAST  TECHNIQUE: Contiguous axial images were obtained from the base of the skull through the vertex without intravenous contrast.  COMPARISON:  None.  FINDINGS: There is no evidence of acute infarction, mass lesion, or intra- or extra-axial hemorrhage on CT.  The posterior fossa, including the cerebellum, brainstem and fourth ventricle, is within normal limits. The third and lateral ventricles, and basal ganglia are unremarkable in appearance. The cerebral hemispheres are symmetric in appearance, with normal gray-white differentiation. No mass effect or midline shift is seen.  There is no  evidence of fracture; visualized osseous structures are unremarkable in appearance. The visualized portions of the orbits are within normal limits. The paranasal sinuses and mastoid air cells are well-aerated. No significant soft tissue abnormalities are seen.  IMPRESSION: Unremarkable noncontrast CT of the head.   Electronically Signed   By: Roanna Raider M.D.   On: 10/31/2014 03:43   Ct Angio Chest Pe W/cm &/or Wo Cm  10/31/2014   CLINICAL DATA:  Acute onset of epigastric abdominal pain, left arm numbness and tingling, and difference in systolic blood pressure between the arms. Left arm tingling and numbness worsens with deep breaths. Initial encounter.  EXAM: CT ANGIOGRAPHY CHEST WITH CONTRAST  TECHNIQUE: Multidetector CT imaging of the chest was performed using the standard protocol during bolus administration of intravenous contrast. Multiplanar CT image reconstructions and MIPs were obtained to evaluate the vascular anatomy.  CONTRAST:  80mL OMNIPAQUE IOHEXOL 350 MG/ML SOLN  COMPARISON:  None.  FINDINGS: There is no evidence of aortic dissection. There is no evidence of aneurysmal dilatation. No calcific atherosclerotic disease is seen. The great vessels are patent and unremarkable in appearance.  There is no evidence of pulmonary embolus.  Minimal bibasilar atelectasis is noted. The lungs are otherwise clear. There is no evidence of significant focal consolidation, pleural effusion or pneumothorax. No masses are identified; no abnormal focal contrast enhancement is seen.  The mediastinum is unremarkable in appearance. No mediastinal lymphadenopathy is seen. No pericardial effusion is identified. No axillary lymphadenopathy is seen. The visualized portions of the thyroid gland are unremarkable in appearance.  The visualized portions of the liver and spleen are unremarkable.  No acute osseous abnormalities are seen.  Review of the MIP images confirms the above findings.  IMPRESSION: 1. No evidence of aortic  dissection. No evidence of aneurysmal dilatation. No calcific atherosclerotic disease seen. Great vessels patent and unremarkable in appearance. 2. No evidence of pulmonary embolus. 3. Minimal bibasilar atelectasis noted; lungs otherwise clear.   Electronically Signed   By: Roanna RaiderJeffery  Chang M.D.   On: 10/31/2014 03:47   Mr Laqueta JeanBrain W GNWo Contrast  10/31/2014   CLINICAL DATA:  34 year old male with history of IVDU, now with acute inability to flex/extend left upper extremity with loss of sensation. Concern for possible septic emboli.  EXAM: MRI HEAD WITHOUT AND WITH CONTRAST  TECHNIQUE: Multiplanar, multiecho pulse sequences of the brain and surrounding structures were obtained without and with intravenous contrast.  CONTRAST:  15mL MULTIHANCE GADOBENATE DIMEGLUMINE 529 MG/ML IV SOLN  COMPARISON:  Prior head CT performed earlier on the same day.  FINDINGS: The CSF containing spaces are within normal limits for patient age. No focal parenchymal signal abnormality is identified. No mass lesion, midline shift, or extra-axial fluid collection. Ventricles are normal in size without evidence of hydrocephalus.  No diffusion-weighted signal abnormality is identified to suggest acute intracranial infarct. Gray-white matter differentiation is maintained. Normal flow voids are seen within the intracranial vasculature. No intracranial hemorrhage identified.  No abnormal enhancement identified.  The cervicomedullary junction is normal. Pituitary gland is within normal limits. Pituitary stalk is midline. The globes and optic nerves demonstrate a normal appearance with normal signal intensity.  The bone marrow signal intensity is normal. Calvarium is intact. Visualized upper cervical spine is within normal limits.  Scalp soft tissues are unremarkable.  Paranasal sinuses are clear.  No mastoid effusion.  IMPRESSION: Normal contrast enhanced MRI of the brain with no acute intracranial abnormality identified.   Electronically Signed   By:  Rise MuBenjamin  McClintock M.D.   On: 10/31/2014 06:45   Mr Chest W Wo Contrast  11/02/2014   CLINICAL DATA:  Acute onset left upper extremity weakness 10/31/2014. Question brachial plexus abnormality.  EXAM: MRI CHEST WITHOUT AND WITH CONTRAST  TECHNIQUE: Multiplanar, multisequence MR imaging was performed both before and after administration of intravenous contrast.  CONTRAST:  15 ml MULTIHANCE GADOBENATE DIMEGLUMINE 529 MG/ML IV SOLN  COMPARISON:  CT chest 10/31/2014.  FINDINGS: The brachial plexus is normal in appearance. No lesion compressing the brachial plexus is identified and no mass is seen. All visualized musculature is intact and normal in appearance and demonstrates normal signal. Although not designed to evaluate intrinsic structures of the shoulder, the rotator cuff is in appears intact and normal. The acromioclavicular joint is unremarkable. All visualized bones demonstrate normal signal. Two small left axillary lymph nodes are incidentally noted. No pathologic lymphadenopathy by CT size criteria seen.  IMPRESSION: Normal examination.  No finding to explain the patient's symptoms.   Electronically Signed   By: Drusilla Kanner M.D.   On: 11/02/2014 08:48   Mr Cervical Spine W Wo Contrast  11/02/2014   CLINICAL DATA:  Left arm weakness  EXAM: MRI CERVICAL SPINE WITHOUT AND WITH CONTRAST  TECHNIQUE: Multiplanar and multiecho pulse sequences of the cervical spine, to include the craniocervical junction and cervicothoracic junction, were obtained according to standard protocol without and with intravenous contrast.  CONTRAST:  15mL MULTIHANCE GADOBENATE DIMEGLUMINE 529 MG/ML IV SOLN  COMPARISON:  Cervical spine CT 02/24/2004  FINDINGS: Normal cervical alignment. Negative for fracture or mass lesion. Bone marrow signal normal.  Cervical spine has normal signal. No cord lesion or cord compression. Craniocervical junction normal.  No significant disc degeneration. Negative for disc protrusion or neural  impingement. Negative for spinal stenosis.  IMPRESSION: Normal   Electronically Signed   By: Marlan Palau M.D.   On: 11/02/2014 08:23   Dg Hand 2 View Left  10/31/2014   CLINICAL DATA:  Left hand weakness and numbness  EXAM: LEFT HAND - 2 VIEW  COMPARISON:  None.  FINDINGS: There is no evidence of fracture or dislocation. There is no evidence of arthropathy or other focal bone abnormality. Soft tissues are unremarkable.  IMPRESSION: No acute abnormality noted.   Electronically Signed   By: Alcide Clever M.D.   On: 10/31/2014 09:08   Dg Chest Port 1 View  10/31/2014   CLINICAL DATA:  Chest pain and paralysis of the left hand.  EXAM: PORTABLE CHEST - 1 VIEW  COMPARISON:  02/24/2012  FINDINGS: The heart size and mediastinal contours are within normal limits. Both lungs are clear. The visualized skeletal structures are unremarkable.  IMPRESSION: Negative portable chest.   Electronically Signed   By: Marnee Spring M.D.   On: 10/31/2014 02:13   Dg Foot Complete Left  11/19/2014   CLINICAL DATA:  Larey Seat on left foot, 4 weeks status post calcaneal surgery. Initial encounter.  EXAM: LEFT FOOT - COMPLETE 3+ VIEW  COMPARISON:  None.  FINDINGS: A small osseous fragment just posterior to the lateral tubercle of the talus may reflect avulsion injury, of indeterminate age. There is no additional evidence for fracture.  The calcaneal hardware is grossly unremarkable in appearance. Mild degenerative change is noted at the midfoot and at the subtalar joint. A small ankle joint effusion is noted.  No additional soft tissue abnormalities are seen.  IMPRESSION: Small osseous fragment noted just posterior to the lateral tubercle of the talus may reflect avulsion injury, of indeterminate age. No additional evidence for fracture. Calcaneal hardware is unremarkable in appearance. Small ankle joint effusion noted.   Electronically Signed   By: Roanna Raider M.D.   On: 11/19/2014 18:19   Admission HPI: Mr. Gartin is a 34 year old  male with left arm weakness, polysubstance use [cocaine, tobacco, h/o heroin], left calcaneal fracture status post ORIF, left arm weakness of unclear etiology who presents with chest pressure and left foot pain.  This morning, he was watching TV when he noticed onset of chest pressure overlying his left chest with radiation to the shoulder. He has never experienced this kind of pressure-like sensation before and it was worse with breathing - to the point where he could not take deep breaths, only shallow breaths. He took his home oxycodone which he had been prescribed for his leg pain though it did not alleviate his pain. Sometime later, he attempted to use the bathroom, but as  he was ambulating towards the bathroom, he fell on his side and was down for some time before he got back up. He does have a walker at home though cannot use it as he is only able to use his right arm. He was recently hospitalized earlier this month and was discharged with follow-up for outpatient physical therapy and neurology for an EMG to discern the cause of his left arm weakness [posterior cord palsy versus brachial plexopathy]. His aunt picked him up from the hospital and took care of him for several days before dropping him off back at his home locally as she felt she could no longer care for him. He has been by himself for the last few days. At baseline, he experiences "electric shocks" 2-3 times a day around his left foot as well as numbness and tingling bilaterally in his arms though worse on his left arm. He reports using cocaine about a week ago but denies any other illicit drug use or alcohol use; he smokes a pack per day and has done so since the age of 44. Prior to this recent illness, he worked as an Personnel officer. Otherwise, he denies productive cough, abdominal pain, nausea, vomiting, diarrhea. In the ED, he received morphine 4 mg IV 3; chest x-ray was notable for left perihilar opacity.  Hospital Course by problem  list: Principal Problem:   Avulsion fracture of bone Active Problems:   Current tobacco use   Polydrug dependence including opioid type drug, episodic abuse   Left arm weakness   Fall at home   Unable to ambulate   Chest tightness or pressure   Parsonage-Turner syndrome   Left calcaneal fracture status post ORIF: Patient is status post ORIF done at [facility] 4 weeks ago. Patient reports recent fall at home due to imbalance. Left foot x-ray notable for small osseous fragment just posterior to the lateral tubercle of the talus which may reflect an avulsion injury age indeterminent but the hardware is otherwise unremarkable. Patient reports difficultly with ambulation and imbalance. Recently saw his orthopedist who had no concerns over his heel and removed his sutures. Oxycodone 10 mg Q4H prn pain was continued on discharge. PT/OT recommended SNF placement.  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 Evidence of fasciulations on examination. MRI brain and Xrays of Left arm were normal. Patient was seen by Neurology who thought symptoms were most consistent with brachial plexopathy or isolated posterior cord palsy. Other differentials include Parsonage-Turner Syndrome due to pain at onset. MRI C-Spine and Brachial Plexus normal. Patient was recommended for SNF placement for continued PT and rehab and to follow up with outpatient neurology follow up for EMG. Please assess improvement of weakness on follow up and compliance with PT.   MSK Chest Pain: Pain and pressure resolved. Pain was worse with movement, palpation and inspiration and expiration. Chest x-ray showed a minimal left perihilar opacity indicative of a mild pneumonia or within normal limits. Leukocytosis has resolved 11.9>9.3. Afebrile and without cough. Poc troponin negative. Troponin negative x  1. EKG without signs of ischemia. Repeat CXR showed mild perihilar increased bronchial markings without convincing focal consolidation. No segmental infiltrate or pulmonary edema. Influenza screen negative. Repeat EKG NSR. Patient was discharged and managed with ibuprofen 600 mg Q6H prn.   History of Polysubstance Abuse [cocaine, h/o heroin, tobacco]: As noted in the history  of present illness. UDS positive for cocaine and opiates. Patient was counseled against substance use.  Discharge Vitals:   BP 130/70 mmHg  Pulse 78  Temp(Src) 98.3 F (36.8 C) (Oral)  Resp 16  Ht  (1.88 m)  Wt 174 lb 4.8 oz (79.062 kg)  BMI 22.37 kg/m2  SpO2 98%  Signed: Jill Alexanders, DO PGY-1 Internal Medicine Resident Pager # [phone] 11/23/2014 10:20 AM  Services Ordered on Discharge: None Equipment Ordered on Discharge: 3 in 1

## 2014-11-23 NOTE — Progress Notes (Addendum)
spoke with Emily,SW at approx 1500 and was updated that she is still looking for a facility for pt to go to, possibilities today declined acceptance. She is continuing to work on this along with her department head. Pt was updated during rounds with oncoming RN at 1540.  Notified SW to come by or call pt as his request to speak with her.

## 2014-11-23 NOTE — Progress Notes (Signed)
Subjective:  Patient was seen and examined this morning. Patient has gained some strength in his left upper extremity, his hand continues to be the weakest. Patient states the pain in his left foot is better controlled today. Overall, he feels well. Patient is ready and willing to be discharged to a SNF.   Objective: Vital signs in last 24 hours: Filed Vitals:   11/22/14 0707 11/22/14 1005 11/22/14 1339 11/23/14 0650  BP: 124/79  139/88 130/70  Pulse: 78  90 78  Temp: 98 F (36.7 C)  98.3 F (36.8 C) 98.3 F (36.8 C)  TempSrc: Oral  Oral Oral  Resp: 16  16   Height:      Weight:      SpO2: 98% 98% 99% 98%   Weight change:   Intake/Output Summary (Last 24 hours) at 11/23/14 1017 Last data filed at 11/23/14 0900  Gross per 24 hour  Intake    240 ml  Output   1275 ml  Net  -1035 ml   General: Vital signs reviewed.  Patient is well-developed and well-nourished, in no acute distress and cooperative with exam.  Cardiovascular: Regular rate, regular rhythm.  Pulmonary/Chest: Clear to auscultation bilaterally, no wheezes, rales, or rhonchi. Abdominal: Soft, non-tender, non-distended, BS + Extremities: Left heel with ecchymosis, well healed surgical incision, edema surrounding heel. 2+ pedal pulses. Neurological: Some improvement in passive range of motion in LUE and some increased strength with resistance of proximal LUE. Hand continues to be the weakest. LUE 4/5 with flexion, extension, abduction and adduction. 3-4/5 grip strength. Fasciculations noted.  Psychiatric: Normal mood and affect. Pleasant.  Lab Results: Basic Metabolic Panel:  Recent Labs Lab 11/19/14 1823 11/20/14 0526  NA 140 137  K 3.6 4.0  CL 103 102  CO2 25 23  GLUCOSE 88 93  BUN 8 11  CREATININE 0.88 0.81  CALCIUM 10.0 9.2   CBC:  Recent Labs Lab 11/19/14 1823 11/20/14 0526  WBC 11.9* 9.3  NEUTROABS 7.4  --   HGB 14.7 14.0  HCT 44.7 42.6  MCV 90.1 93.0  PLT 402* 363   Cardiac  Enzymes:  Recent Labs Lab 11/20/14 0526  TROPONINI <0.03   Urine Drug Screen: Drugs of Abuse     Component Value Date/Time   LABOPIA POSITIVE* 11/19/2014 2156   COCAINSCRNUR POSITIVE* 11/19/2014 2156   LABBENZ NONE DETECTED 11/19/2014 2156   AMPHETMU NONE DETECTED 11/19/2014 2156   THCU NONE DETECTED 11/19/2014 2156   LABBARB NONE DETECTED 11/19/2014 2156    Studies/Results: No results found. Medications:  I have reviewed the patient's current medications. Prior to Admission:  Prescriptions prior to admission  Medication Sig Dispense Refill Last Dose  . Oxycodone HCl 10 MG TABS Take 1 tablet by mouth every 6 (six) hours as needed (pain).    11/19/2014 at 700  . dicyclomine (BENTYL) 20 MG tablet Take 1 tablet (20 mg total) by mouth 2 (two) times daily. (Patient not taking: Reported on 10/31/2014) 20 tablet 0 Not Taking at Unknown time  . diphenoxylate-atropine (LOMOTIL) 2.5-0.025 MG per tablet Take 2 tablets by mouth 4 (four) times daily as needed for diarrhea or loose stools. (Patient not taking: Reported on 10/31/2014) 30 tablet 0 Not Taking at Unknown time  . loperamide (IMODIUM) 2 MG capsule Take 1 capsule (2 mg total) by mouth 4 (four) times daily as needed for diarrhea or loose stools. (Patient not taking: Reported on 10/31/2014) 12 capsule 0 Not Taking at Unknown time  . ondansetron (  ZOFRAN ODT) 4 MG disintegrating tablet  ODT q4 hours prn nausea/vomit (Patient not taking: Reported on 10/31/2014) 15 tablet 0 Not Taking at Unknown time   Scheduled Meds: . enoxaparin (LOVENOX) injection  40 mg Subcutaneous Q24H  . nicotine  21 mg Transdermal Q24H   Continuous Infusions:   PRN Meds:.ibuprofen, ondansetron **OR** ondansetron (ZOFRAN) IV, oxyCODONE, oxyCODONE Assessment/Plan: Principal Problem:   Avulsion fracture of bone Active Problems:   Current tobacco use   Polydrug dependence including opioid type drug, episodic abuse   Left arm weakness   Fall at home   Unable to  ambulate   Chest tightness or pressure   [place]-Turner syndrome  Left calcaneal fracture status post ORIF: Pain is better controlled today. Patient is optimistic about SNF placement and continued improvement with PT.  -Continue Oxycodone 10 mg Q4H prn pain -Oxycodone 15 mg daily prn before/after PT/OT -PT/OT  -Awaiting SNF placement  History of Polysubstance Abuse [cocaine, h/o heroin, tobacco]: As noted in the history of present illness. UDS positive for cocaine and opiates.  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 Patient should follow up with Neurology for EMG after discharge from SNF. Awaiting SNF placement.  -Follow up outpatient with Neurology -PT/OT  -Awaiting SNF palcement  FEN:  -Diet: Regular  DVT/PE ppx: Lovenox SQ QD  CODE STATUS: FULL CODE  Dispo: Disposition is deferred waiting on placement.  Anticipated discharge this afternoon.   The patient does not have a current PCP (Pcp Not In System) and does need an [facility] hospital follow-up appointment after discharge.  The patient does have transportation limitations that hinder transportation to clinic appointments.  .Services Needed at time of discharge: Y = Yes, Blank = No PT:   OT:   RN:   Equipment:   Other:       Jill Alexanders, DO PGY-1 Internal Medicine Resident Pager # [phone] 11/23/2014 10:17 AM

## 2014-11-23 NOTE — Clinical Social Work Placement (Signed)
CSW, CSW ChiropodistAssistant Director, and Wellsite geologistMedical Director actively pursuing SNF placement for patient.  Patient currently has no bed offers.  [facility].  CSW to continue to update medical team as discharge disposition information becomes available.  Marcelline DeistEmily Venkat Ankney, [place] Cell: [phone]  Clinical Social Work: Orthopedics [phone]([id number]) and Surgical [phone]([id number])

## 2014-11-23 NOTE — Progress Notes (Signed)
Physical Therapy Treatment Patient Details Name: Kenneth Charles MRN: [id number] DOB: 03/07/1981 Today's Date: 11/23/2014    History of Present Illness Pt admitted with chest pain.  Pt also with recent history of fall off of deck with L calcaneal ORIF and NWB status with new x-ray showing alvusion fracture. 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  Pt had gone home from hospital with family caring, but they were no longer able to care for him and pt was by himself several days and reports several falls.  PMH also includes polysubstance abuse.    PT Comments    Pt pleasant and agreeable to therapy today; more willing to adhere to therapist's advice; have encouraged him to practice gentle LUE and LLE ROM; Pt verbalizes that he is agreeable to SNF at this time  Follow Up Recommendations  SNF     Equipment Recommendations  None recommended by PT    Recommendations for Other Services       Precautions / Restrictions Precautions Precautions: Other (comment) Precaution Comments: L UE weakness, mildly impulsive; encouraged awareness LUE positioning, pt maintains  flexed posturing Restrictions LLE Weight Bearing: Non weight bearing    Mobility  Bed Mobility Overal bed mobility: Needs Assistance Bed Mobility: Supine to Sit     Supine to sit: Modified independent (Device/Increase time);Supervision     General bed mobility comments: MOD I to supervision for safety--HOB elevated and slightly impulsive  Transfers Overall transfer level: Needs assistance Equipment used: Left platform walker Transfers: Sit to/from Stand;Stand Pivot Transfers Sit to Stand: Supervision;Min guard Stand pivot transfers: Supervision;Min guard       General transfer comment: repeated transfers from varied ht surfaces, cues for LUE awareness and  NWB; pt maintains NWB well, supervision throughout for safety as pt is mildly impulsive, though not overtly  unsafe  Ambulation/Gait Ambulation/Gait assistance: Min guard;Supervision Ambulation Distance (Feet): 75 Feet (15' x 2) Assistive device: Left platform walker       General Gait Details: pt requested to walk into bathroom to shower, cleared by RN to shower; cues for safety with RW  and correct LUE positioning on platform   Stairs            Wheelchair Mobility    Modified Rankin (Stroke Patients Only)       Balance Overall balance assessment: Needs assistance           Standing balance-Leahy Scale: Fair Standing balance comment: Pt able to maintain standing on RLE, NWB on LLE; requires supervision to min/guard for safety with wt shifting             High level balance activites: Backward walking;Direction changes;Turns;Side stepping      Cognition Arousal/Alertness: Awake/alert Behavior During Therapy: Impulsive Overall Cognitive Status: Within Functional Limits for tasks assessed                      Exercises General Exercises - Upper Extremity Elbow Flexion: AROM;Left;5 reps;AAROM Elbow Extension: AROM;Left;10 reps;AAROM Wrist Flexion: AAROM;Self ROM;Left;5 reps Wrist Extension: Left;10 reps;AAROM;Self ROM Digit Composite Flexion: AROM;Left;5 reps Composite Extension: AROM;Left;5 reps General Exercises - Lower Extremity Ankle Circles/Pumps: AROM;Left;10 reps Shoulder Exercises Shoulder Flexion: AAROM;Left;Supine;5 reps    General Comments        Pertinent Vitals/Pain Pain Assessment:  (pt does not rate) Pain Location: L foot Pain Intervention(s): Monitored during session;Premedicated before session;Repositioned    Home Living  Prior Function            PT Goals (current goals can now be found in the care plan section) Acute Rehab PT Goals Patient Stated Goal: wants to get hand working again Time For Goal Achievement: 12/04/14 Potential to Achieve Goals: Good Progress towards PT goals:  Progressing toward goals    Frequency  Min 4X/week    PT Plan Current plan remains appropriate    Co-evaluation             End of Session   Activity Tolerance: Patient tolerated treatment well Patient left: in bed;with call bell/phone within reach     Time: 1102-1129 PT Time Calculation (min) (ACUTE ONLY): 27 min  Charges:  $Gait Training: 23-37 mins                    G Codes:      Kenneth Charles 11/23/2014, 12:45 PM

## 2014-11-24 MED ORDER — OXYCODONE HCL 15 MG PO TABS: 15.0000 mg | ORAL_TABLET | Freq: Every day | ORAL | Status: DC | PRN

## 2014-11-24 MED ORDER — OXYCODONE HCL 10 MG PO TABS: 10.0000 mg | ORAL_TABLET | ORAL | Status: DC | PRN

## 2014-11-24 NOTE — Progress Notes (Signed)
Subjective:  Patient was seen and examined this morning. Patient denies any current complaints. He continues to have pain, redness and swelling in his left foot during PT which resolves with rest. Pain is currently controlled. He denies any fever or chills. Weakness in the left upper extremity is the same.   Objective: Vital signs in last 24 hours: Filed Vitals:   11/23/14 0650 11/23/14 1300 11/23/14 2100 11/24/14 0500  BP: 130/70 144/95 128/82 123/83  Pulse: 78 103 107 93  Temp: 98.3 F (36.8 C) 98.4 F (36.9 C) 98.8 F (37.1 C)   TempSrc: Oral Oral    Resp:  Height:      Weight:      SpO2: 98% 96% 97% 97%   Weight change:   Intake/Output Summary (Last 24 hours) at 11/24/14 0829 Last data filed at 11/23/14 1400  Gross per 24 hour  Intake    480 ml  Output    325 ml  Net    155 ml   General: Vital signs reviewed.  Patient is well-developed and well-nourished, in no acute distress and cooperative with exam.  Cardiovascular: Regular rate, regular rhythm.  Pulmonary/Chest: Clear to auscultation bilaterally, no wheezes, rales, or rhonchi. Abdominal: Soft, non-tender, non-distended, BS + Extremities: Left heel with ecchymosis, well healed surgical incision, edema surrounding heel. 2+ pedal pulses. Neurological: LUE 4/5 with flexion, extension, abduction and adduction. 3-4/5 grip strength. Fasciculations noted.  Psychiatric: Normal mood and affect. Pleasant.  Lab Results: Basic Metabolic Panel:  Recent Labs Lab 11/19/14 1823 11/20/14 0526  NA 140 137  K 3.6 4.0  CL 103 102  CO2 25 23  GLUCOSE 88 93  BUN 8 11  CREATININE 0.88 0.81  CALCIUM 10.0 9.2   CBC:  Recent Labs Lab 11/19/14 1823 11/20/14 0526  WBC 11.9* 9.3  NEUTROABS 7.4  --   HGB 14.7 14.0  HCT 44.7 42.6  MCV 90.1 93.0  PLT 402* 363   Cardiac Enzymes:  Recent Labs Lab 11/20/14 0526  TROPONINI <0.03   Urine Drug Screen: Drugs of Abuse     Component Value Date/Time   LABOPIA  POSITIVE* 11/19/2014 2156   COCAINSCRNUR POSITIVE* 11/19/2014 2156   LABBENZ NONE DETECTED 11/19/2014 2156   AMPHETMU NONE DETECTED 11/19/2014 2156   THCU NONE DETECTED 11/19/2014 2156   LABBARB NONE DETECTED 11/19/2014 2156    Studies/Results: No results found. Medications:  I have reviewed the patient's current medications. Prior to Admission:  Prescriptions prior to admission  Medication Sig Dispense Refill Last Dose  . Oxycodone HCl 10 MG TABS Take 1 tablet by mouth every 6 (six) hours as needed (pain).    11/19/2014 at 700  . dicyclomine (BENTYL) 20 MG tablet Take 1 tablet (20 mg total) by mouth 2 (two) times daily. (Patient not taking: Reported on 10/31/2014) 20 tablet 0 Not Taking at Unknown time  . diphenoxylate-atropine (LOMOTIL) 2.5-0.025 MG per tablet Take 2 tablets by mouth 4 (four) times daily as needed for diarrhea or loose stools. (Patient not taking: Reported on 10/31/2014) 30 tablet 0 Not Taking at Unknown time  . loperamide (IMODIUM) 2 MG capsule Take 1 capsule (2 mg total) by mouth 4 (four) times daily as needed for diarrhea or loose stools. (Patient not taking: Reported on 10/31/2014) 12 capsule 0 Not Taking at Unknown time  . ondansetron (ZOFRAN ODT) 4 MG disintegrating tablet  ODT q4 hours prn nausea/vomit (Patient not taking: Reported on 10/31/2014) 15 tablet 0 Not Taking  at Unknown time   Scheduled Meds: . enoxaparin (LOVENOX) injection  40 mg Subcutaneous Q24H  . 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 Social work is continuing to [employer]. We will discuss options with them today. -Continue Oxycodone 10 mg Q4H prn pain -Oxycodone 15 mg daily prn before/after PT/OT -PT/OT  -Awaiting SNF placement  History of Polysubstance Abuse [cocaine, h/o heroin, tobacco]: As noted in the history of present illness. UDS positive for cocaine and opiates.  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 Awaiting SNF placement.  -Follow up outpatient with Neurology -PT/OT  -Awaiting SNF placement  FEN:  -Diet: Regular  DVT/PE ppx: Lovenox SQ QD  CODE STATUS: FULL CODE  Dispo: Disposition is deferred waiting on placement.  Anticipated discharge this afternoon.   The patient does not have a current PCP (Pcp Not In System) and [facility]  The patient does have transportation limitations that hinder transportation to clinic appointments.  .Services Needed at time of discharge: Y = Yes, Blank = No PT:   OT:   RN:   Equipment:   Other:       Jill AlexandersAlexa Richardson, DO PGY-1 Internal Medicine Resident Pager # [phone] 11/24/2014 8:29 AM

## 2014-11-24 NOTE — Discharge Summary (Signed)
Name: Kenneth Charles MRN: [id number] DOB: 11-05-1980 34 y.o. PCP: Pcp Not In System  Date of Admission: 11/19/2014  4:48 PM Date of Discharge: 11/24/2014 Attending Physician: Levert Feinstein, MD  Discharge Diagnosis:  Principal Problem:   Avulsion fracture of bone Active Problems:   Current tobacco use   Polydrug dependence including opioid type drug, episodic abuse   Left arm weakness   Fall at home   Unable to ambulate   Chest tightness or pressure   Parsonage-Turner syndrome  Discharge Medications:   Medication List    STOP taking these medications        dicyclomine 20 MG tablet  Commonly known as:  BENTYL     diphenoxylate-atropine 2.5-0.025 MG per tablet  Commonly known as:  LOMOTIL     loperamide 2 MG capsule  Commonly known as:  IMODIUM     ondansetron 4 MG disintegrating tablet  Commonly known as:  ZOFRAN ODT      TAKE these medications        ibuprofen 600 MG tablet  Commonly known as:  ADVIL,MOTRIN  Take 1 tablet (600 mg total) by mouth every 6 (six) hours as needed for moderate pain.     Oxycodone HCl 10 MG Tabs  Take 1 tablet by mouth every 6 (six) hours as needed (pain).     oxyCODONE 15 MG immediate release tablet  Commonly known as:  ROXICODONE  Take 1 tablet (15 mg total) by mouth daily as needed for severe pain.        Disposition and follow-up:   Kenneth Charles was discharged from [facility] in Good condition.  At the hospital follow up visit please address:  1.  Parsonage-Turner Syndrome (LUE weakness): Assess improvement with PT in strength and sensation. Follow up with Neuro for EMG.  Left Calcaneal Fracture s/p ORIF: Please assess healing and pain control. Follow up with Ortho.   2.  Labs / imaging needed at time of follow-up: EMG  3.  Pending labs/ test needing follow-up: None  Follow-up Appointments: Follow-up Information    Follow up with [name], MD.   Specialty:   Neurology   Why:  to schedule an appointment for Neurology follow up   Contact information:   [address] [place] [address] [phone]       Discharge Instructions: Discharge Instructions    Diet - low sodium heart healthy    Complete by:  As directed      Increase activity slowly    Complete by:  As directed            Consultations:  None  Procedures Performed:  Dg Chest 2 View  11/20/2014   CLINICAL DATA:  Left side chest pain, possible fever, shortness of Breath  EXAM: CHEST  2 VIEW  COMPARISON:  11/19/2014 and 10/31/2014  FINDINGS: Cardiomediastinal silhouette is stable. Mild perihilar increased bronchial markings without convincing focal consolidation. No segmental infiltrate or pulmonary edema. Bony thorax is stable.  IMPRESSION: Mild perihilar increased bronchial markings without convincing focal consolidation. No segmental infiltrate or pulmonary edema.   Electronically Signed   By: Natasha Mead M.D.   On: 11/20/2014 09:02   Dg Chest 2 View  11/19/2014   CLINICAL DATA:  Acute onset of generalized chest pain. Initial encounter.  EXAM: CHEST  2 VIEW  COMPARISON:  Chest radiograph and CTA of the chest performed 10/31/2014  FINDINGS: The lungs are well-aerated. Minimal left perihilar  opacity could reflect mild pneumonia. Would correlate with the patient's symptoms. There is no evidence of pleural effusion or pneumothorax.  The heart is normal in size; the mediastinal contour is within normal limits. No acute osseous abnormalities are seen.  IMPRESSION: Minimal left perihilar opacity could reflect mild pneumonia, or could remain within normal limits. Would correlate with the patient's symptoms.   Electronically Signed   By: Roanna Raider M.D.   On: 11/19/2014 18:15   Dg Forearm Left  10/31/2014   CLINICAL DATA:  Left arm weakness and numbness  EXAM: LEFT FOREARM - 2 VIEW  COMPARISON:  None.  FINDINGS: There is no evidence of fracture or other focal bone lesions. Soft  tissues are unremarkable.  IMPRESSION: No acute abnormality noted.   Electronically Signed   By: Alcide Clever M.D.   On: 10/31/2014 09:07   Dg Ankle Complete Left  11/19/2014   CLINICAL DATA:  Four weeks status post calcaneal surgery. Fell on left ankle today. Initial encounter.  EXAM: LEFT ANKLE COMPLETE - 3+ VIEW  COMPARISON:  None.  FINDINGS: A small osseous fragment is noted arising from the lateral tubercle of the talus. This may reflect an avulsion injury, of indeterminate age.  Visualized hardware along the calcaneus appears grossly unremarkable. Mild degenerative change is seen at the subtalar joint. The ankle mortise appears grossly intact; the interosseous space is within normal limits. No talar tilt or subluxation is seen.  The joint spaces are preserved. No significant soft tissue abnormalities are seen.  IMPRESSION: Small osseous fragment arising from the lateral tubercle of the talus may reflect an avulsion injury, of indeterminate age. Would correlate with the patient's symptoms. Hardware along the calcaneus is grossly unremarkable.   Electronically Signed   By: Roanna Raider M.D.   On: 11/19/2014 18:17   Ct Head Wo Contrast  10/31/2014   CLINICAL DATA:  Acute onset of left arm numbness and tingling, with difference in systolic blood pressure between the arms. Initial encounter.  EXAM: CT HEAD WITHOUT CONTRAST  TECHNIQUE: Contiguous axial images were obtained from the base of the skull through the vertex without intravenous contrast.  COMPARISON:  None.  FINDINGS: There is no evidence of acute infarction, mass lesion, or intra- or extra-axial hemorrhage on CT.  The posterior fossa, including the cerebellum, brainstem and fourth ventricle, is within normal limits. The third and lateral ventricles, and basal ganglia are unremarkable in appearance. The cerebral hemispheres are symmetric in appearance, with normal gray-white differentiation. No mass effect or midline shift is seen.  There is no  evidence of fracture; visualized osseous structures are unremarkable in appearance. The visualized portions of the orbits are within normal limits. The paranasal sinuses and mastoid air cells are well-aerated. No significant soft tissue abnormalities are seen.  IMPRESSION: Unremarkable noncontrast CT of the head.   Electronically Signed   By: Roanna Raider M.D.   On: 10/31/2014 03:43   Ct Angio Chest Pe W/cm &/or Wo Cm  10/31/2014   CLINICAL DATA:  Acute onset of epigastric abdominal pain, left arm numbness and tingling, and difference in systolic blood pressure between the arms. Left arm tingling and numbness worsens with deep breaths. Initial encounter.  EXAM: CT ANGIOGRAPHY CHEST WITH CONTRAST  TECHNIQUE: Multidetector CT imaging of the chest was performed using the standard protocol during bolus administration of intravenous contrast. Multiplanar CT image reconstructions and MIPs were obtained to evaluate the vascular anatomy.  CONTRAST:  80mL OMNIPAQUE IOHEXOL 350 MG/ML SOLN  COMPARISON:  None.  FINDINGS: There is no evidence of aortic dissection. There is no evidence of aneurysmal dilatation. No calcific atherosclerotic disease is seen. The great vessels are patent and unremarkable in appearance.  There is no evidence of pulmonary embolus.  Minimal bibasilar atelectasis is noted. The lungs are otherwise clear. There is no evidence of significant focal consolidation, pleural effusion or pneumothorax. No masses are identified; no abnormal focal contrast enhancement is seen.  The mediastinum is unremarkable in appearance. No mediastinal lymphadenopathy is seen. No pericardial effusion is identified. No axillary lymphadenopathy is seen. The visualized portions of the thyroid gland are unremarkable in appearance.  The visualized portions of the liver and spleen are unremarkable.  No acute osseous abnormalities are seen.  Review of the MIP images confirms the above findings.  IMPRESSION: 1. No evidence of aortic  dissection. No evidence of aneurysmal dilatation. No calcific atherosclerotic disease seen. Great vessels patent and unremarkable in appearance. 2. No evidence of pulmonary embolus. 3. Minimal bibasilar atelectasis noted; lungs otherwise clear.   Electronically Signed   By: Roanna RaiderJeffery  Chang M.D.   On: 10/31/2014 03:47   Mr Laqueta JeanBrain W GNWo Contrast  10/31/2014   CLINICAL DATA:  34 year old male with history of IVDU, now with acute inability to flex/extend left upper extremity with loss of sensation. Concern for possible septic emboli.  EXAM: MRI HEAD WITHOUT AND WITH CONTRAST  TECHNIQUE: Multiplanar, multiecho pulse sequences of the brain and surrounding structures were obtained without and with intravenous contrast.  CONTRAST:  15mL MULTIHANCE GADOBENATE DIMEGLUMINE 529 MG/ML IV SOLN  COMPARISON:  Prior head CT performed earlier on the same day.  FINDINGS: The CSF containing spaces are within normal limits for patient age. No focal parenchymal signal abnormality is identified. No mass lesion, midline shift, or extra-axial fluid collection. Ventricles are normal in size without evidence of hydrocephalus.  No diffusion-weighted signal abnormality is identified to suggest acute intracranial infarct. Gray-white matter differentiation is maintained. Normal flow voids are seen within the intracranial vasculature. No intracranial hemorrhage identified.  No abnormal enhancement identified.  The cervicomedullary junction is normal. Pituitary gland is within normal limits. Pituitary stalk is midline. The globes and optic nerves demonstrate a normal appearance with normal signal intensity.  The bone marrow signal intensity is normal. Calvarium is intact. Visualized upper cervical spine is within normal limits.  Scalp soft tissues are unremarkable.  Paranasal sinuses are clear.  No mastoid effusion.  IMPRESSION: Normal contrast enhanced MRI of the brain with no acute intracranial abnormality identified.   Electronically Signed   By:  Rise MuBenjamin  McClintock M.D.   On: 10/31/2014 06:45   Mr Chest W Wo Contrast  11/02/2014   CLINICAL DATA:  Acute onset left upper extremity weakness 10/31/2014. Question brachial plexus abnormality.  EXAM: MRI CHEST WITHOUT AND WITH CONTRAST  TECHNIQUE: Multiplanar, multisequence MR imaging was performed both before and after administration of intravenous contrast.  CONTRAST:  15 ml MULTIHANCE GADOBENATE DIMEGLUMINE 529 MG/ML IV SOLN  COMPARISON:  CT chest 10/31/2014.  FINDINGS: The brachial plexus is normal in appearance. No lesion compressing the brachial plexus is identified and no mass is seen. All visualized musculature is intact and normal in appearance and demonstrates normal signal. Although not designed to evaluate intrinsic structures of the shoulder, the rotator cuff is in appears intact and normal. The acromioclavicular joint is unremarkable. All visualized bones demonstrate normal signal. Two small left axillary lymph nodes are incidentally noted. No pathologic lymphadenopathy by CT size criteria seen.  IMPRESSION: Normal examination.  No finding to explain the patient's symptoms.   Electronically Signed   By: Drusilla Kanner M.D.   On: 11/02/2014 08:48   Mr Cervical Spine W Wo Contrast  11/02/2014   CLINICAL DATA:  Left arm weakness  EXAM: MRI CERVICAL SPINE WITHOUT AND WITH CONTRAST  TECHNIQUE: Multiplanar and multiecho pulse sequences of the cervical spine, to include the craniocervical junction and cervicothoracic junction, were obtained according to standard protocol without and with intravenous contrast.  CONTRAST:  15mL MULTIHANCE GADOBENATE DIMEGLUMINE 529 MG/ML IV SOLN  COMPARISON:  Cervical spine CT 02/24/2004  FINDINGS: Normal cervical alignment. Negative for fracture or mass lesion. Bone marrow signal normal.  Cervical spine has normal signal. No cord lesion or cord compression. Craniocervical junction normal.  No significant disc degeneration. Negative for disc protrusion or neural  impingement. Negative for spinal stenosis.  IMPRESSION: Normal   Electronically Signed   By: Marlan Palau M.D.   On: 11/02/2014 08:23   Dg Hand 2 View Left  10/31/2014   CLINICAL DATA:  Left hand weakness and numbness  EXAM: LEFT HAND - 2 VIEW  COMPARISON:  None.  FINDINGS: There is no evidence of fracture or dislocation. There is no evidence of arthropathy or other focal bone abnormality. Soft tissues are unremarkable.  IMPRESSION: No acute abnormality noted.   Electronically Signed   By: Alcide Clever M.D.   On: 10/31/2014 09:08   Dg Chest Port 1 View  10/31/2014   CLINICAL DATA:  Chest pain and paralysis of the left hand.  EXAM: PORTABLE CHEST - 1 VIEW  COMPARISON:  02/24/2012  FINDINGS: The heart size and mediastinal contours are within normal limits. Both lungs are clear. The visualized skeletal structures are unremarkable.  IMPRESSION: Negative portable chest.   Electronically Signed   By: Marnee Spring M.D.   On: 10/31/2014 02:13   Dg Foot Complete Left  11/19/2014   CLINICAL DATA:  Larey Seat on left foot, 4 weeks status post calcaneal surgery. Initial encounter.  EXAM: LEFT FOOT - COMPLETE 3+ VIEW  COMPARISON:  None.  FINDINGS: A small osseous fragment just posterior to the lateral tubercle of the talus may reflect avulsion injury, of indeterminate age. There is no additional evidence for fracture.  The calcaneal hardware is grossly unremarkable in appearance. Mild degenerative change is noted at the midfoot and at the subtalar joint. A small ankle joint effusion is noted.  No additional soft tissue abnormalities are seen.  IMPRESSION: Small osseous fragment noted just posterior to the lateral tubercle of the talus may reflect avulsion injury, of indeterminate age. No additional evidence for fracture. Calcaneal hardware is unremarkable in appearance. Small ankle joint effusion noted.   Electronically Signed   By: Roanna Raider M.D.   On: 11/19/2014 18:19   Admission HPI: Kenneth Charles is a 34 year old  male with left arm weakness, polysubstance use [cocaine, tobacco, h/o heroin], left calcaneal fracture status post ORIF, left arm weakness of unclear etiology who presents with chest pressure and left foot pain.  This morning, he was watching TV when he noticed onset of chest pressure overlying his left chest with radiation to the shoulder. He has never experienced this kind of pressure-like sensation before and it was worse with breathing - to the point where he could not take deep breaths, only shallow breaths. He took his home oxycodone which he had been prescribed for his leg pain though it did not alleviate his pain. Sometime later, he attempted to use the bathroom, but as  he was ambulating towards the bathroom, he fell on his side and was down for some time before he got back up. He does have a walker at home though cannot use it as he is only able to use his right arm. He was recently hospitalized earlier this month and was discharged with follow-up for outpatient physical therapy and neurology for an EMG to discern the cause of his left arm weakness [posterior cord palsy versus brachial plexopathy]. His aunt picked him up from the hospital and took care of him for several days before dropping him off back at his home locally as she felt she could no longer care for him. He has been by himself for the last few days. At baseline, he experiences "electric shocks" 2-3 times a day around his left foot as well as numbness and tingling bilaterally in his arms though worse on his left arm. He reports using cocaine about a week ago but denies any other illicit drug use or alcohol use; he smokes a pack per day and has done so since the age of 45. Prior to this recent illness, he worked as an Personnel officer. Otherwise, he denies productive cough, abdominal pain, nausea, vomiting, diarrhea. In the ED, he received morphine 4 mg IV 3; chest x-ray was notable for left perihilar opacity.  Hospital Course by problem  list: Principal Problem:   Avulsion fracture of bone Active Problems:   Current tobacco use   Polydrug dependence including opioid type drug, episodic abuse   Left arm weakness   Fall at home   Unable to ambulate   Chest tightness or pressure   Parsonage-Turner syndrome   Left calcaneal fracture status post ORIF: Patient is status post ORIF done at [facility] 4 weeks ago. Patient reports recent fall at home due to imbalance. Left foot x-ray notable for small osseous fragment just posterior to the lateral tubercle of the talus which may reflect an avulsion injury age indeterminent but the hardware is otherwise unremarkable. Patient reports difficultly with ambulation and imbalance. Recently saw his orthopedist who had no concerns over his heel and removed his sutures. Oxycodone 10 mg Q4H prn pain was continued on discharge. Patient also given Oxycodone 15 mg QD prn for prior to PT/OT sessions. PT/OT recommended SNF placement for continued rehab.  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 Evidence of fasciulations on examination. MRI brain and Xrays of Left arm were normal. Patient was seen by Neurology who thought symptoms were most consistent with brachial plexopathy or isolated posterior cord palsy. Other differentials include Parsonage-Turner Syndrome due to pain at onset. MRI C-Spine and Brachial Plexus normal. Patient was recommended for SNF placement for continued PT and rehab and to follow up with outpatient neurology follow up for EMG. Please assess improvement of weakness on follow up and compliance with PT.   MSK Chest Pain: Pain and pressure resolved. Pain was worse with movement, palpation and inspiration and expiration. Chest x-ray showed a minimal left perihilar opacity indicative of a mild pneumonia or within normal limits. Leukocytosis  has resolved 11.9>9.3. Afebrile and without cough. Poc troponin negative. Troponin negative x 1. EKG without signs of ischemia. Repeat CXR showed mild perihilar increased bronchial markings without convincing focal consolidation. No segmental infiltrate or pulmonary edema. Influenza screen negative. Repeat EKG NSR. Patient was discharged and managed with ibuprofen 600 mg Q6H  prn.   History of Polysubstance Abuse [cocaine, h/o heroin, tobacco]: As noted in the history of present illness. UDS positive for cocaine and opiates. Patient was counseled against substance use.  Discharge Vitals:   BP 123/83 mmHg  Pulse 93  Temp(Src) 98.8 F (37.1 C) (Oral)  Resp 16  Ht 6\' 2"  (1.88 m)  Wt 174 lb 4.8 oz (79.062 kg)  BMI 22.37 kg/m2  SpO2 97%  Signed: Jill AlexandersAlexa Richardson, DO PGY-1 Internal Medicine Resident Pager # [phone] 11/24/2014 1:46 PM  Services Ordered on Discharge: None Equipment Ordered on Discharge: 3 in 1

## 2014-11-24 NOTE — Discharge Planning (Signed)
[facility]. Patient updated at bedside and is still agreeable.  LOG approved by Financial risk analystCSW Assistant Director.  [facility] Report number: [phone]) [phone] Transportation: [facility] (will arrive to pick-up patient at 3:30)  Lily Kochermily Valeriano Bain, LCSWA Cell: [phone]       Fax: [phone] Clinical Social Work: Orthopedics [phone]([id number]) and Surgical [phone]([id number])

## 2014-11-24 NOTE — Clinical Social Work Note (Signed)
[facility] has been [facility]. Patient's bed available today.   Patient updated at bedside and is agreeable. MD paged regarding discharge.  Marcelline DeistEmily Syncere Eble, [place] Cell: [phone]       Fax: [phone] Clinical Social Work: Orthopedics [phone](5N9-32) and Surgical [phone]([id number])

## 2014-11-24 NOTE — Progress Notes (Signed)
Chaplain initiated visit with pt and family. Chaplain introduced herself and her services. Pt is hoping to be discharged soon. Chaplain will continue to follow.    11/24/14 1200  Clinical Encounter Type  Visited With Patient and family together  Visit Type Initial;Spiritual support  Spiritual Encounters  Spiritual Needs Emotional  Jiles HaroldStamey, Kaneisha Ellenberger F, Chaplain 11/24/2014 12:41 PM

## 2014-12-07 ENCOUNTER — Ambulatory Visit: Payer: Self-pay | Admitting: Neurology

## 2014-12-11 ENCOUNTER — Encounter: Payer: Self-pay | Admitting: Neurology

## 2015-02-17 ENCOUNTER — Encounter (HOSPITAL_BASED_OUTPATIENT_CLINIC_OR_DEPARTMENT_OTHER): Payer: Self-pay | Admitting: *Deleted

## 2015-02-17 ENCOUNTER — Emergency Department (HOSPITAL_BASED_OUTPATIENT_CLINIC_OR_DEPARTMENT_OTHER)
Admission: EM | Admit: 2015-02-17 | Discharge: 2015-02-18 | Disposition: A | Discharge status: Home / Self Care | Payer: Medicaid Other | Attending: Emergency Medicine | Admitting: Emergency Medicine

## 2015-02-17 ENCOUNTER — Emergency Department (HOSPITAL_BASED_OUTPATIENT_CLINIC_OR_DEPARTMENT_OTHER): Payer: Medicaid Other

## 2015-02-17 ENCOUNTER — Emergency Department (HOSPITAL_BASED_OUTPATIENT_CLINIC_OR_DEPARTMENT_OTHER)
Admission: EM | Admit: 2015-02-17 | Discharge: 2015-02-17 | Disposition: A | Discharge status: Home / Self Care | Payer: Medicaid Other | Source: Home / Self Care | Attending: Emergency Medicine | Admitting: Emergency Medicine

## 2015-02-17 DIAGNOSIS — R079 Chest pain, unspecified: Secondary | ICD-10-CM | POA: Insufficient documentation

## 2015-02-17 DIAGNOSIS — Z79899 Other long term (current) drug therapy: Secondary | ICD-10-CM | POA: Insufficient documentation

## 2015-02-17 DIAGNOSIS — J4520 Mild intermittent asthma, uncomplicated: Secondary | ICD-10-CM

## 2015-02-17 DIAGNOSIS — R42 Dizziness and giddiness: Secondary | ICD-10-CM | POA: Insufficient documentation

## 2015-02-17 DIAGNOSIS — Z720 Tobacco use: Secondary | ICD-10-CM | POA: Insufficient documentation

## 2015-02-17 DIAGNOSIS — J45909 Unspecified asthma, uncomplicated: Secondary | ICD-10-CM | POA: Insufficient documentation

## 2015-02-17 DIAGNOSIS — I10 Essential (primary) hypertension: Secondary | ICD-10-CM | POA: Insufficient documentation

## 2015-02-17 DIAGNOSIS — R1111 Vomiting without nausea: Secondary | ICD-10-CM | POA: Diagnosis not present

## 2015-02-17 DIAGNOSIS — I951 Orthostatic hypotension: Secondary | ICD-10-CM

## 2015-02-17 DIAGNOSIS — Z8659 Personal history of other mental and behavioral disorders: Secondary | ICD-10-CM

## 2015-02-17 DIAGNOSIS — R2242 Localized swelling, mass and lump, left lower limb: Secondary | ICD-10-CM | POA: Insufficient documentation

## 2015-02-17 LAB — RAPID URINE DRUG SCREEN, HOSP PERFORMED
Amphetamines: NOT DETECTED
BARBITURATES: NOT DETECTED
Benzodiazepines: NOT DETECTED
COCAINE: NOT DETECTED
Opiates: POSITIVE — AB
TETRAHYDROCANNABINOL: POSITIVE — AB

## 2015-02-17 LAB — BASIC METABOLIC PANEL
Anion gap: 10 (ref 5–15)
BUN: 13 mg/dL (ref 6–20)
CHLORIDE: 101 mmol/L (ref 101–111)
CO2: 27 mmol/L (ref 22–32)
Calcium: 9.2 mg/dL (ref 8.9–10.3)
Creatinine, Ser: 0.85 mg/dL (ref 0.61–1.24)
GFR calc Af Amer: >60 mL/min (ref >60)
GFR calc non Af Amer: >60 mL/min (ref >60)
Glucose, Bld: 128 mg/dL — ABNORMAL HIGH (ref 65–99)
POTASSIUM: 3.9 mmol/L (ref 3.5–5.1)
Sodium: 138 mmol/L (ref 135–145)

## 2015-02-17 LAB — CBC WITH DIFFERENTIAL/PLATELET
Basophils Absolute: 0 10*3/uL (ref 0.0–0.1)
Basophils Absolute: 0 10*3/uL (ref 0.0–0.1)
Basophils Relative: 0 % (ref 0–1)
Basophils Relative: 0 % (ref 0–1)
Eosinophils Absolute: 0.5 10*3/uL (ref 0.0–0.7)
Eosinophils Absolute: 0.5 10*3/uL (ref 0.0–0.7)
Eosinophils Relative: 5 % (ref 0–5)
Eosinophils Relative: 5 % (ref 0–5)
HCT: 42.6 % (ref 39.0–52.0)
HCT: 41.9 % (ref 39.0–52.0)
Hemoglobin: 14.1 g/dL (ref 13.0–17.0)
Hemoglobin: 13.7 g/dL (ref 13.0–17.0)
LYMPHS ABS: 2.3 10*3/uL (ref 0.7–4.0)
LYMPHS PCT: 27 % (ref 12–46)
Lymphocytes Relative: 24 % (ref 12–46)
Lymphs Abs: 2.6 10*3/uL (ref 0.7–4.0)
MCH: 30.1 pg (ref 26.0–34.0)
MCH: 30 pg (ref 26.0–34.0)
MCHC: 33.1 g/dL (ref 30.0–36.0)
MCHC: 32.7 g/dL (ref 30.0–36.0)
MCV: 90.8 fL (ref 78.0–100.0)
MCV: 91.9 fL (ref 78.0–100.0)
MONOS PCT: 7 % (ref 3–12)
MONOS PCT: 7 % (ref 3–12)
Monocytes Absolute: 0.7 10*3/uL (ref 0.1–1.0)
Monocytes Absolute: 0.6 10*3/uL (ref 0.1–1.0)
NEUTROS ABS: 5.9 10*3/uL (ref 1.7–7.7)
NEUTROS PCT: 61 % (ref 43–77)
Neutro Abs: 6.1 10*3/uL (ref 1.7–7.7)
Neutrophils Relative %: 64 % (ref 43–77)
PLATELETS: 398 10*3/uL (ref 150–400)
Platelets: 395 10*3/uL (ref 150–400)
RBC: 4.69 MIL/uL (ref 4.22–5.81)
RBC: 4.56 MIL/uL (ref 4.22–5.81)
RDW: 13.1 % (ref 11.5–15.5)
RDW: 13.1 % (ref 11.5–15.5)
WBC: 9.7 10*3/uL (ref 4.0–10.5)
WBC: 9.5 10*3/uL (ref 4.0–10.5)

## 2015-02-17 LAB — COMPREHENSIVE METABOLIC PANEL
ALBUMIN: 3.9 g/dL (ref 3.5–5.0)
ALT: 16 U/L — ABNORMAL LOW (ref 17–63)
AST: 16 U/L (ref 15–41)
Alkaline Phosphatase: 108 U/L (ref 38–126)
Anion gap: 9 (ref 5–15)
BILIRUBIN TOTAL: 0.3 mg/dL (ref 0.3–1.2)
BUN: 14 mg/dL (ref 6–20)
CO2: 31 mmol/L (ref 22–32)
CREATININE: 0.92 mg/dL (ref 0.61–1.24)
Calcium: 9.1 mg/dL (ref 8.9–10.3)
Chloride: 100 mmol/L — ABNORMAL LOW (ref 101–111)
GFR calc Af Amer: >60 mL/min (ref >60)
GFR calc non Af Amer: >60 mL/min (ref >60)
Glucose, Bld: 99 mg/dL (ref 65–99)
Potassium: 3.8 mmol/L (ref 3.5–5.1)
Sodium: 140 mmol/L (ref 135–145)
TOTAL PROTEIN: 7.2 g/dL (ref 6.5–8.1)

## 2015-02-17 LAB — URINALYSIS, ROUTINE W REFLEX MICROSCOPIC
Bilirubin Urine: NEGATIVE
Glucose, UA: NEGATIVE mg/dL
Hgb urine dipstick: NEGATIVE
Ketones, ur: NEGATIVE mg/dL
Leukocytes, UA: NEGATIVE
NITRITE: NEGATIVE
Protein, ur: NEGATIVE mg/dL
SPECIFIC GRAVITY, URINE: 1.014 (ref 1.005–1.030)
Urobilinogen, UA: 0.2 mg/dL (ref 0.0–1.0)
pH: 5.5 (ref 5.0–8.0)

## 2015-02-17 LAB — ETHANOL

## 2015-02-17 LAB — ACETAMINOPHEN LEVEL

## 2015-02-17 LAB — TROPONIN I: Troponin I: <0.03 ng/mL (ref <0.031)

## 2015-02-17 LAB — SALICYLATE LEVEL

## 2015-02-17 MED ORDER — ALBUTEROL SULFATE HFA 108 (90 BASE) MCG/ACT IN AERS
2.0000 | INHALATION_SPRAY | RESPIRATORY_TRACT | Status: DC | PRN
  Administered 2015-02-17: 2 via RESPIRATORY_TRACT
  Filled 2015-02-17: qty 6.7

## 2015-02-17 MED ORDER — IPRATROPIUM-ALBUTEROL 0.5-2.5 (3) MG/3ML IN SOLN
3.0000 mL | Freq: Once | RESPIRATORY_TRACT | Status: AC
  Administered 2015-02-17: 3 mL via RESPIRATORY_TRACT
  Filled 2015-02-17: qty 3

## 2015-02-17 MED ORDER — SODIUM CHLORIDE 0.9 % IV BOLUS (SEPSIS)
1000.0000 mL | Freq: Once | INTRAVENOUS | Status: AC
  Administered 2015-02-17: 1000 mL via INTRAVENOUS

## 2015-02-17 MED ORDER — LORAZEPAM 1 MG PO TABS
1.0000 mg | ORAL_TABLET | Freq: Once | ORAL | Status: AC
  Administered 2015-02-17: 1 mg via ORAL
  Filled 2015-02-17: qty 1

## 2015-02-17 NOTE — ED Notes (Signed)
Pt alert, NAD, calm, interactive, resps e/u, speaking in clear complete sentences, VSS, denies pain.  c/o dizziness, room spinning, and some nausea. NSR on monitor.

## 2015-02-17 NOTE — ED Notes (Signed)
Reports "feel the same, dizzy and weak".  Alert, NAD, calm, interactive, resting with eyes closed. Neb tx complete. Tightness improved with tx.

## 2015-02-17 NOTE — ED Provider Notes (Signed)
CSN: [id number]     Arrival date & time 02/17/15  1512 History   First MD Initiated Contact with Patient 02/17/15 1525     Chief Complaint  Patient presents with  . Dizziness     (Consider location/radiation/quality/duration/timing/severity/associated sxs/prior Treatment) HPI Comments: Comes in with c/o near syncope that started 30 minutes ago. Pt states that his face feels numb and he feels like his heart is beating fast. He dose have a history of asthma, and panic attacks. He states that he is not having cp or sob. He did use heroin in the last 2 hours. Denies vomiting or diarrhea.   The history is provided by the patient. No language interpreter was used.    Past Medical History  Diagnosis Date  . Asthma   . Anxiety   . Heroin addiction    Past Surgical History  Procedure Laterality Date  . Orif calcaneal fracture Left 10/05/2014   Family History  Problem Relation Age of Onset  . Hypertension Father   . Hypertension Mother    History  Substance Use Topics  . Smoking status: Current Every Day Smoker -- 0.50 packs/day    Types: Cigarettes  . Smokeless tobacco: Never Used  . Alcohol Use: No    Review of Systems  All other systems reviewed and are negative.     Allergies  Review of patient's allergies indicates no known allergies.  Home Medications   Prior to Admission medications   Medication Sig Start Date End Date Taking? Authorizing Provider  ibuprofen (ADVIL,MOTRIN) 600 MG tablet Take 1 tablet (600 mg total) by mouth every 6 (six) hours as needed for moderate pain. 11/21/14   Alexa Dulcy Fanny, MD  oxyCODONE (ROXICODONE) 15 MG immediate release tablet Take 1 tablet (15 mg total) by mouth daily as needed. Prior to or after PT sessions 11/24/14   Alexa Dulcy Fanny, MD  oxyCODONE 10 MG TABS Take 1 tablet (10 mg total) by mouth every 4 (four) hours as needed (pain). 11/24/14   Alexa Dulcy Fanny, MD   BP 121/76 mmHg  Pulse 109  Temp(Src) 98.6 F (37 C) (Oral)   Resp 20  Ht  (1.905 m)  Wt 180 lb (81.647 kg)  BMI 22.50 kg/m2  SpO2 97% Physical Exam  Constitutional: He is oriented to person, place, and time. He appears well-developed and well-nourished.  HENT:  Head: Normocephalic and atraumatic.  Right Ear: External ear normal.  Left Ear: External ear normal.  Eyes: Conjunctivae and EOM are normal. Pupils are equal, round, and reactive to light.  Cardiovascular: Normal rate and regular rhythm.   Pulmonary/Chest: Effort normal and breath sounds normal.  Abdominal: Soft. Bowel sounds are normal. There is no tenderness.  Musculoskeletal: Normal range of motion.  Neurological: He is alert and oriented to person, place, and time. He exhibits normal muscle tone. Coordination normal.  Skin:  Multiple scabbed are noted to bilateral upper extremities  Psychiatric: He has a normal mood and affect.  Nursing note and vitals reviewed.   ED Course  Procedures (including critical care time) Labs Review Labs Reviewed  BASIC METABOLIC PANEL - Abnormal; Notable for the following:    Glucose, Bld 128 (*)    All other components within normal limits  TROPONIN I  CBC WITH DIFFERENTIAL/PLATELET    Imaging Review Dg Chest 2 View  02/17/2015   CLINICAL DATA:  Dizziness, cough and shortness of breath for 1 day. Initial encounter.  EXAM: CHEST  2 VIEW  COMPARISON:  PA and lateral chest 11/20/2014 and 02/24/2004.  FINDINGS: The lungs are clear. Heart size is normal. There is no pneumothorax or pleural effusion. No focal bony abnormality is identified.  IMPRESSION: Negative chest.   Electronically Signed   By: Drusilla Kanner M.D.   On: 02/17/2015 15:44     EKG Interpretation   Date/Time:  Saturday February 17 2015 15:29:08 EDT Ventricular Rate:  102 PR Interval:  134 QRS Duration: 82 QT Interval:  350 QTC Calculation: 456 R Axis:   81 Text Interpretation:  Sinus tachycardia Otherwise normal ECG Early  repolarization No significant change was found  Confirmed by Manus Gunning  MD,  STEPHEN [phone]) on 02/17/2015 4:13:12 PM      MDM   Final diagnoses:  Dizziness  Asthma, mild intermittent, uncomplicated    Pt ambulatory without any problem. Pt is requesting and inhaler for his asthma since he is out of it at this time. Pt is feeling better symptoms likely related to heroine  Use and anxiety. Doubt cardiac in nature. Pt continues to not have cp and is feeling better    Teressa Lower, NP 02/17/15 1715  Glynn Octave, MD 02/17/15 1759

## 2015-02-17 NOTE — ED Notes (Signed)
Back from CT, alert, NAD, calm.  

## 2015-02-17 NOTE — Discharge Instructions (Signed)
Orthostatic Hypotension Keep yourself hydrated. Stop doing heroin. Return to the ED if you develop new or worsening symptoms. Orthostatic hypotension is a sudden drop in blood pressure. It happens when you quickly stand up from a seated or lying position. You may feel dizzy or light-headed. This can last for just a few seconds or for up to a few minutes. It is usually not a serious problem. However, if this happens frequently or gets worse, it can be a sign of something more serious. CAUSES  Different things can cause orthostatic hypotension, including:   Loss of body fluids (dehydration).  Medicines that lower blood pressure.  Sudden changes in posture, such as standing up quickly after you have been sitting or lying down.  Taking too much of your medicine. SIGNS AND SYMPTOMS   Light-headedness or dizziness.   Fainting or near-fainting.   A fast heart rate.   Weakness.   Feeling tired (fatigue).  DIAGNOSIS  Your health care provider may do several things to help diagnose your condition and identify the cause. These may include:   Taking a medical history and doing a physical exam.  Checking your blood pressure. Your health care provider will check your blood pressure when you are:  Lying down.  Sitting.  Standing.  Using tilt table testing. In this test, you lie down on a table that moves from a lying position to a standing position. You will be strapped onto the table. This test monitors your blood pressure and heart rate when you are in different positions. TREATMENT  Treatment will vary depending on the cause. Possible treatments include:   Changing the dosage of your medicines.  Wearing compression stockings on your lower legs.  Standing up slowly after sitting or lying down.  Eating more salt.  Eating frequent, small meals.  In some cases, getting IV fluids.  Taking medicine to enhance fluid retention. HOME CARE INSTRUCTIONS  Only take  over-the-counter or prescription medicines as directed by your health care provider.  Follow your health care provider's instructions for changing the dosage of your current medicines.  Do not stop or adjust your medicine on your own.  Stand up slowly after sitting or lying down. This allows your body to adjust to the different position.  Wear compression stockings as directed.  Eat extra salt as directed.  Do not add extra salt to your diet unless directed to by your health care provider.  Eat frequent, small meals.  Avoid standing suddenly after eating.  Avoid hot showers or excessive heat as directed by your health care provider.  Keep all follow-up appointments. SEEK MEDICAL CARE IF:  You continue to feel dizzy or light-headed after standing.  You feel groggy or confused.  You feel cold, clammy, or sick to your stomach (nauseous).  You have blurred vision.  You feel short of breath. SEEK IMMEDIATE MEDICAL CARE IF:   You faint after standing.  You have chest pain.  You have difficulty breathing.   You lose feeling or movement in your arms or legs.   You have slurred speech or difficulty talking, or you are unable to talk.  MAKE SURE YOU:   Understand these instructions.  Will watch your condition.  Will get help right away if you are not doing well or get worse. Document Released: 08/08/2002 Document Revised: 08/23/2013 Document Reviewed: 06/10/2013 [facility] Patient Information 2015 [place], [place]. This information is not intended to replace advice given to you by your health care provider. Make sure you discuss  any questions you have with your health care provider. ° °

## 2015-02-17 NOTE — ED Notes (Addendum)
Pt reports onset of dizziness while riding in car approx 30 mins ago- states his face feels numb- hx of panic attacks- pt reports he injected heroin 2 hours ago

## 2015-02-17 NOTE — ED Notes (Addendum)
Pt tolerated orthostatic VS, reported dizziness w/o change throughout. Given PO water (challenge). Family at [facility].

## 2015-02-17 NOTE — ED Notes (Signed)
Pt seen and released earlier this afternoon for same C/O. States he felt sx had improved at time of d/c but went home and now feels much worse- states face is numb and he feels dizzy- states i've never felt like this, it's a very scary feeling"

## 2015-02-17 NOTE — Discharge Instructions (Signed)
Asthma Asthma is a condition of the lungs in which the airways tighten and narrow. Asthma can make it hard to breathe. Asthma cannot be cured, but medicine and lifestyle changes can help control it. Asthma may be started (triggered) by:  Animal skin flakes (dander).  Dust.  Cockroaches.  Pollen.  Mold.  Smoke.  Cleaning products.  Hair sprays or aerosol sprays.  Paint fumes or strong smells.  Cold air, weather changes, and winds.  Crying or laughing hard.  Stress.  Certain medicines or drugs.  Foods, such as dried fruit, potato chips, and sparkling grape juice.  Infections or conditions (colds, flu).  Exercise.  Certain medical conditions or diseases.  Exercise or tiring activities. HOME CARE   Take medicine as told by your doctor.  Use a peak flow meter as told by your doctor. A peak flow meter is a tool that measures how well the lungs are working.  Record and keep track of the peak flow meter's readings.  Understand and use the asthma action plan. An asthma action plan is a written plan for taking care of your asthma and treating your attacks.  To help prevent asthma attacks:  Do not smoke. Stay away from secondhand smoke.  Change your heating and air conditioning filter often.  Limit your use of fireplaces and wood stoves.  Get rid of pests (such as roaches and mice) and their droppings.  Throw away plants if you see mold on them.  Clean your floors. Dust regularly. Use cleaning products that do not smell.  Have someone vacuum when you are not home. Use a vacuum cleaner with a HEPA filter if possible.  Replace carpet with wood, tile, or vinyl flooring. Carpet can trap animal skin flakes and dust.  Use allergy-proof pillows, mattress covers, and box spring covers.  Wash bed sheets and blankets every week in hot water and dry them in a dryer.  Use blankets that are made of polyester or cotton.  Clean bathrooms and kitchens with bleach. If  possible, have someone repaint the walls in these rooms with mold-resistant paint. Keep out of the rooms that are being cleaned and painted.  Wash hands often. GET HELP IF:  You have make a whistling sound when breaking (wheeze), have shortness of breath, or have a cough even if taking medicine to prevent attacks.  The colored mucus you cough up (sputum) is thicker than usual.  The colored mucus you cough up changes from clear or white to yellow, green, gray, or bloody.  You have problems from the medicine you are taking such as:  A rash.  Itching.  Swelling.  Trouble breathing.  You need reliever medicines more than 2-3 times a week.  Your peak flow measurement is still at 50-79% of your personal best after following the action plan for 1 hour.  You have a fever. GET HELP RIGHT AWAY IF:   You seem to be worse and are not responding to medicine during an asthma attack.  You are short of breath even at rest.  You get short of breath when doing very little activity.  You have trouble eating, drinking, or talking.  You have chest pain.  You have a fast heartbeat.  Your lips or fingernails start to turn blue.  You are light-headed, dizzy, or faint.  Your peak flow is less than 50% of your personal best. MAKE SURE YOU:   Understand these instructions.  Will watch your condition.  Will get help right away if you  are not doing well or get worse. Document Released: 02/04/2008 Document Revised: 01/02/2014 Document Reviewed: 03/17/2013 [facility] Patient Information 2015 [place], [place]. This information is not intended to replace advice given to you by your health care provider. Make sure you discuss any questions you have with your health care provider.  Dizziness Dizziness is a common problem. It is a feeling of unsteadiness or light-headedness. You may feel like you are about to faint. Dizziness can lead to injury if you stumble or fall. A person of any age group can  suffer from dizziness, but dizziness is more common in older adults. CAUSES  Dizziness can be caused by many different things, including:  Middle ear problems.  Standing for too long.  Infections.  An allergic reaction.  Aging.  An emotional response to something, such as the sight of blood.  Side effects of medicines.  Tiredness.  Problems with circulation or blood pressure.  Excessive use of alcohol or medicines, or illegal drug use.  Breathing too fast (hyperventilation).  An irregular heart rhythm (arrhythmia).  A low red blood cell count (anemia).  Pregnancy.  Vomiting, diarrhea, fever, or other illnesses that cause body fluid loss (dehydration).  Diseases or conditions such as Parkinson's disease, high blood pressure (hypertension), diabetes, and thyroid problems.  Exposure to extreme heat. DIAGNOSIS  Your health care provider will ask about your symptoms, perform a physical exam, and perform an electrocardiogram (ECG) to record the electrical activity of your heart. Your health care provider may also perform other heart or blood tests to determine the cause of your dizziness. These may include:  Transthoracic echocardiogram (TTE). During echocardiography, sound waves are used to evaluate how blood flows through your heart.  Transesophageal echocardiogram (TEE).  Cardiac monitoring. This allows your health care provider to monitor your heart rate and rhythm in real time.  Holter monitor. This is a portable device that records your heartbeat and can help diagnose heart arrhythmias. It allows your health care provider to track your heart activity for several days if needed.  Stress tests by exercise or by giving medicine that makes the heart beat faster. TREATMENT  Treatment of dizziness depends on the cause of your symptoms and can vary greatly. HOME CARE INSTRUCTIONS   Drink enough fluids to keep your urine clear or pale yellow. This is especially important  in very hot weather. In older adults, it is also important in cold weather.  Take your medicine exactly as directed if your dizziness is caused by medicines. When taking blood pressure medicines, it is especially important to get up slowly.  Rise slowly from chairs and steady yourself until you feel okay.  In the morning, first sit up on the side of the bed. When you feel okay, stand slowly while holding onto something until you know your balance is fine.  Move your legs often if you need to stand in one place for a long time. Tighten and relax your muscles in your legs while standing.  Have someone stay with you for 1-2 days if dizziness continues to be a problem. Do this until you feel you are well enough to stay alone. Have the person call your health care provider if he or she notices changes in you that are concerning.  Do not drive or use heavy machinery if you feel dizzy.  Do not drink alcohol. SEEK IMMEDIATE MEDICAL CARE IF:   Your dizziness or light-headedness gets worse.  You feel nauseous or vomit.  You have problems talking, walking,  or using your arms, hands, or legs.  You feel weak.  You are not thinking clearly or you have trouble forming sentences. It may take a friend or family member to notice this.  You have chest pain, abdominal pain, shortness of breath, or sweating.  Your vision changes.  You notice any bleeding.  You have side effects from medicine that seems to be getting worse rather than better. MAKE SURE YOU:   Understand these instructions.  Will watch your condition.  Will get help right away if you are not doing well or get worse. Document Released: 02/11/2001 Document Revised: 08/23/2013 Document Reviewed: 03/07/2011 [facility] Patient Information 2015 [place], [place]. This information is not intended to replace advice given to you by your health care provider. Make sure you discuss any questions you have with your health care provider.

## 2015-02-17 NOTE — ED Provider Notes (Signed)
CSN: [id number]     Arrival date & time 02/17/15  1843 History  This chart was scribed for Glynn Octave, MD by Lyndel Safe, ED Scribe. This patient was seen in room MH04/MH04 and the patient's care was started 7:14 PM.   Chief Complaint  Patient presents with  . Dizziness   The history is provided by the patient. No language interpreter was used.    HPI Comments: Kenneth Charles is a 34 y.o. male, with a PMhx of HTN, asthma, tobacco abuse (1ppd) and IV drug abuse, who presents to the Emergency Department complaining of worsening, constant, moderate, dizziness and numbness to bilateral cheeks that began 5 hours ago.  He notes associated blurry vision, a shaky feeling, and sharp, intermittent chest pain that lasts for a few seconds at a time.  Pt injected heroine in between the toes of his left foot 2.5 hours prior to onset of simultaneous dizziness and numbness. He denies experiencing prior similar symptoms with injecting.  He states his dizziness to be exacerbated on standing. Pt denies it to be a room spinning dizziness, but likens it to be 'wavy' with associated light-headedness. After he was discharged home from being seen in the ED for the same complaint approximately 2 hours ago he reports he rested on the couch for an hour and the symptoms worsened. He denies injecting heroine after being discharged from earlier ED visit. Pt uses heroine daily and occasionally consumes alcohol. He has a PShx on his left ankle with baseline edema. Denies using any other prescribed medication besides albuterol inhaler, marijuana, cocaine. Additionally denies fever, chills, vomiting, LOC, or head injury, or a history of DM, HTN.  Past Medical History  Diagnosis Date  . Asthma   . Anxiety   . Heroin addiction    Past Surgical History  Procedure Laterality Date  . Orif calcaneal fracture Left 10/05/2014   Family History  Problem Relation Age of Onset  . Hypertension Father   . Hypertension  Mother    History  Substance Use Topics  . Smoking status: Current Every Day Smoker -- 0.50 packs/day    Types: Cigarettes  . Smokeless tobacco: Never Used  . Alcohol Use: No    Review of Systems  Constitutional: Negative for fever and chills.  Eyes: Positive for visual disturbance.  Cardiovascular: Positive for chest pain.  Gastrointestinal: Positive for nausea. Negative for vomiting.  Musculoskeletal: Positive for arthralgias.  Neurological: Positive for dizziness, light-headedness and numbness. Negative for syncope.   A complete 10 system review of systems was obtained and is otherwise negative except at noted in the HPI and PMH.  Allergies  Review of patient's allergies indicates no known allergies.  Home Medications   Prior to Admission medications   Medication Sig Start Date End Date Taking? Authorizing Provider  ibuprofen (ADVIL,MOTRIN) 600 MG tablet Take 1 tablet (600 mg total) by mouth every 6 (six) hours as needed for moderate pain. 11/21/14   Alexa Dulcy Fanny, MD  oxyCODONE (ROXICODONE) 15 MG immediate release tablet Take 1 tablet (15 mg total) by mouth daily as needed. Prior to or after PT sessions 11/24/14   Alexa Dulcy Fanny, MD  oxyCODONE 10 MG TABS Take 1 tablet (10 mg total) by mouth every 4 (four) hours as needed (pain). 11/24/14   Alexa Dulcy Fanny, MD   BP 125/69 mmHg  Pulse 100  Temp(Src) 98.4 F (36.9 C) (Oral)  Resp 18  Ht 6\' 3"  (1.905 m)  Wt 175 lb (79.379 kg)  BMI 21.87 kg/m2  SpO2 98% Physical Exam  Constitutional: He is oriented to person, place, and time. He appears well-developed and well-nourished. No distress.  Appears to be under the influence.  HENT:  Head: Normocephalic and atraumatic.  Mouth/Throat: Oropharynx is clear and moist. No oropharyngeal exudate.  Eyes: Conjunctivae and EOM are normal. Pupils are equal, round, and reactive to light.  Neck: Normal range of motion. Neck supple.  No meningismus.  Cardiovascular: Normal rate,  regular rhythm, normal heart sounds and intact distal pulses.   No murmur heard. Pulmonary/Chest: Effort normal and breath sounds normal. No respiratory distress.  Abdominal: Soft. There is no tenderness. There is no rebound and no guarding.  Musculoskeletal: Normal range of motion. He exhibits no edema or tenderness.  Left ankle baseline swelling.   Neurological: He is alert and oriented to person, place, and time. No cranial nerve deficit. He exhibits normal muscle tone. Coordination normal.  No ataxia on finger to nose bilaterally. No pronator drift. 5/5 strength throughout. CN 2-12 intact. Negative Romberg. Equal grip strength. Sensation intact. Gait is normal.   Skin: Skin is warm.  Multiple scabbed lesions to upper extremities.   Psychiatric: He has a normal mood and affect. His behavior is normal.  Nursing note and vitals reviewed.   ED Course  Procedures  DIAGNOSTIC STUDIES: Oxygen Saturation is 98% on RA, normal by my interpretation.    COORDINATION OF CARE: 7:26 PM Discussed treatment plan which includes to order IV fluids and Lorazepam   with pt. Pt acknowledges and agrees to plan.  9:21 PM Will order breathing treatment. Discussed unremarkable chest X-ray. Pt reports numbness has resolved and dizziness and nausea are improving. He notes continued tightness in chest. Negative for wheezing on auscultation.   Labs Review Labs Reviewed  COMPREHENSIVE METABOLIC PANEL - Abnormal; Notable for the following:    Chloride 100 (*)    ALT 16 (*)    All other components within normal limits  URINE RAPID DRUG SCREEN, HOSP PERFORMED - Abnormal; Notable for the following:    Opiates POSITIVE (*)    Tetrahydrocannabinol POSITIVE (*)    All other components within normal limits  ACETAMINOPHEN LEVEL - Abnormal; Notable for the following:    Acetaminophen (Tylenol), Serum <10 (*)    All other components within normal limits  CBC WITH DIFFERENTIAL/PLATELET  TROPONIN I  URINALYSIS,  ROUTINE W REFLEX MICROSCOPIC (NOT AT [facility])  ETHANOL  SALICYLATE LEVEL    Imaging Review Dg Chest 2 View  02/17/2015   CLINICAL DATA:  Dizziness, cough and shortness of breath for 1 day. Initial encounter.  EXAM: CHEST  2 VIEW  COMPARISON:  PA and lateral chest 11/20/2014 and 02/24/2004.  FINDINGS: The lungs are clear. Heart size is normal. There is no pneumothorax or pleural effusion. No focal bony abnormality is identified.  IMPRESSION: Negative chest.   Electronically Signed   By: Drusilla Kanner M.D.   On: 02/17/2015 15:44   Ct Head Wo Contrast  02/17/2015   CLINICAL DATA:  Dizziness and diplopia acute onset. Recent heroin injection  EXAM: CT HEAD WITHOUT CONTRAST  TECHNIQUE: Contiguous axial images were obtained from the base of the skull through the vertex without intravenous contrast.  COMPARISON:  Head CT and brain MRI October 31, 2014  FINDINGS: The ventricles are normal in size and configuration. There is no intracranial mass, hemorrhage, extra-axial fluid collection, or midline shift. Gray-white compartments are normal. No acute infarct apparent. The bony calvarium appears intact. The mastoid air  cells are clear.  IMPRESSION: Study within normal limits.   Electronically Signed   By: Bretta Bang III M.D.   On: 02/17/2015 21:14     EKG Interpretation   Date/Time:  Saturday February 17 2015 20:35:10 EDT Ventricular Rate:  73 PR Interval:  150 QRS Duration: 92 QT Interval:  394 QTC Calculation: 434 R Axis:   70 Text Interpretation:  Sinus rhythm with marked sinus arrhythmia Otherwise  normal ECG Rate slower Confirmed by Manus Gunning  MD, Chae Oommen [phone]) on  02/17/2015 9:31:00 PM      MDM    Patient returns for lightheadedness, dizziness, facial tingling that onset around 2 PM. This is about 2 hours after doing heroin. He denies any chest pain or shortness of breath he denies any focal weakness, numbness or tingling. Denies any bowel or bladder incontinence. Facial tingling is  bilateral and does not suggest stroke or TIA.  Orthostatics earlier were negative. Patient has a nonfocal neurological exam.  Labs appear to be at baseline and unchanged. EKG is normal sinus and unchanged. Rate slower than previous. Orthostatics positive with heart rate elevated to 120 with standing and 95 with sitting. Patient given IV fluids and by mouth fluids.  Ambulatory in the ED and tolerating fluids.  Advised to stop heroin use.  Return precautions discussed.   I personally performed the services described in this documentation, which was scribed in my presence. The recorded information has been reviewed and is accurate.   Glynn Octave, MD 02/18/15 [phone]

## 2015-02-17 NOTE — ED Notes (Signed)
Breathing tx in progress, RT at [facility].

## 2015-02-18 NOTE — ED Notes (Signed)
Pt ambulated with steady gait. Dr. Manus Gunning in to see pt at time of d/c.

## 2015-03-08 ENCOUNTER — Emergency Department (HOSPITAL_COMMUNITY): Payer: Medicaid Other

## 2015-03-08 ENCOUNTER — Emergency Department (HOSPITAL_COMMUNITY)
Admission: EM | Admit: 2015-03-08 | Discharge: 2015-03-09 | Disposition: A | Discharge status: Home / Self Care | Payer: Medicaid Other | Attending: Emergency Medicine | Admitting: Emergency Medicine

## 2015-03-08 ENCOUNTER — Encounter (HOSPITAL_COMMUNITY): Payer: Self-pay | Admitting: *Deleted

## 2015-03-08 DIAGNOSIS — Y9289 Other specified places as the place of occurrence of the external cause: Secondary | ICD-10-CM | POA: Diagnosis not present

## 2015-03-08 DIAGNOSIS — F419 Anxiety disorder, unspecified: Secondary | ICD-10-CM | POA: Diagnosis not present

## 2015-03-08 DIAGNOSIS — W1839XA Other fall on same level, initial encounter: Secondary | ICD-10-CM | POA: Diagnosis not present

## 2015-03-08 DIAGNOSIS — J45909 Unspecified asthma, uncomplicated: Secondary | ICD-10-CM | POA: Insufficient documentation

## 2015-03-08 DIAGNOSIS — Y9389 Activity, other specified: Secondary | ICD-10-CM | POA: Insufficient documentation

## 2015-03-08 DIAGNOSIS — Y998 Other external cause status: Secondary | ICD-10-CM | POA: Insufficient documentation

## 2015-03-08 DIAGNOSIS — Z79899 Other long term (current) drug therapy: Secondary | ICD-10-CM | POA: Diagnosis not present

## 2015-03-08 DIAGNOSIS — R55 Syncope and collapse: Secondary | ICD-10-CM | POA: Diagnosis present

## 2015-03-08 DIAGNOSIS — S99912A Unspecified injury of left ankle, initial encounter: Secondary | ICD-10-CM | POA: Insufficient documentation

## 2015-03-08 DIAGNOSIS — Z720 Tobacco use: Secondary | ICD-10-CM | POA: Diagnosis not present

## 2015-03-08 DIAGNOSIS — S29001A Unspecified injury of muscle and tendon of front wall of thorax, initial encounter: Secondary | ICD-10-CM | POA: Diagnosis not present

## 2015-03-08 DIAGNOSIS — S4992XA Unspecified injury of left shoulder and upper arm, initial encounter: Secondary | ICD-10-CM | POA: Insufficient documentation

## 2015-03-08 LAB — BASIC METABOLIC PANEL
Anion gap: 9 (ref 5–15)
BUN: 12 mg/dL (ref 6–20)
CALCIUM: 8.8 mg/dL — AB (ref 8.9–10.3)
CO2: 22 mmol/L (ref 22–32)
CREATININE: 0.87 mg/dL (ref 0.61–1.24)
Chloride: 108 mmol/L (ref 101–111)
GFR calc Af Amer: >60 mL/min (ref >60)
GLUCOSE: 93 mg/dL (ref 65–99)
Potassium: 3.7 mmol/L (ref 3.5–5.1)
SODIUM: 139 mmol/L (ref 135–145)

## 2015-03-08 LAB — CBC
HEMATOCRIT: 38.1 % — AB (ref 39.0–52.0)
Hemoglobin: 12.5 g/dL — ABNORMAL LOW (ref 13.0–17.0)
MCH: 29.2 pg (ref 26.0–34.0)
MCHC: 32.8 g/dL (ref 30.0–36.0)
MCV: 89 fL (ref 78.0–100.0)
PLATELETS: 339 10*3/uL (ref 150–400)
RBC: 4.28 MIL/uL (ref 4.22–5.81)
RDW: 13 % (ref 11.5–15.5)
WBC: 8.9 10*3/uL (ref 4.0–10.5)

## 2015-03-08 LAB — CBG MONITORING, ED: Glucose-Capillary: 131 mg/dL — ABNORMAL HIGH (ref 65–99)

## 2015-03-08 MED ORDER — SODIUM CHLORIDE 0.9 % IV BOLUS (SEPSIS)
1000.0000 mL | Freq: Once | INTRAVENOUS | Status: AC
  Administered 2015-03-08: 1000 mL via INTRAVENOUS

## 2015-03-08 MED ORDER — FENTANYL CITRATE (PF) 100 MCG/2ML IJ SOLN
100.0000 ug | Freq: Once | INTRAMUSCULAR | Status: AC
  Administered 2015-03-08: 100 ug via INTRAVENOUS
  Filled 2015-03-08: qty 2

## 2015-03-08 NOTE — ED Notes (Signed)
Pt arrives via EMS from home. Had a syncopal episode while smoking a cigarette and Fell in small bush. Pt c/o neck, L ankle, L shoulder and chest wall pain 10/10. Pt had surgery on his ankle 2 months ago.

## 2015-03-08 NOTE — ED Provider Notes (Signed)
CSN: [id number]     Arrival date & time 03/08/15  1519 History   First MD Initiated Contact with Patient 03/08/15 1539     Chief Complaint  Patient presents with  . Loss of Consciousness  . Ankle Injury     (Consider location/radiation/quality/duration/timing/severity/associated sxs/prior Treatment) HPI Patient presents to the emergency department with possible syncopal episode that occurred just prior to arrival.  Patient states that he thinks he passed out while he was outside today.  The patient states he been having pain in his leg and ankle along with his neck, which both chronic.  The patient states that he has had this issue in the past.  The patient states that he does not have any chest pain, shortness of nausea, vomiting, weakness, back pain, fever, cough, urinary incontinence or abdominal pain.  The patient states that seems make his condition, better or worse Past Medical History  Diagnosis Date  . Asthma   . Anxiety   . Heroin addiction    Past Surgical History  Procedure Laterality Date  . Orif calcaneal fracture Left 10/05/2014   Family History  Problem Relation Age of Onset  . Hypertension Father   . Hypertension Mother    History  Substance Use Topics  . Smoking status: Current Every Day Smoker -- 0.50 packs/day    Types: Cigarettes  . Smokeless tobacco: Never Used  . Alcohol Use: No    Review of Systems   All other systems negative except as documented in the HPI. All pertinent positives and negatives as reviewed in the HPI. Allergies  Review of patient's allergies indicates no known allergies.  Home Medications   Prior to Admission medications   Medication Sig Start Date End Date Taking? Authorizing Provider  ibuprofen (ADVIL,MOTRIN) 600 MG tablet Take 1 tablet (600 mg total) by mouth every 6 (six) hours as needed for moderate pain. 11/21/14  Yes Alexa Dulcy Fanny, MD  oxyCODONE (ROXICODONE) 15 MG immediate release tablet Take 1 tablet (15 mg total)  by mouth daily as needed. Prior to or after PT sessions 11/24/14  Yes Alexa Dulcy Fanny, MD  oxyCODONE 10 MG TABS Take 1 tablet (10 mg total) by mouth every 4 (four) hours as needed (pain). 11/24/14  Yes Alexa Dulcy Fanny, MD   BP 131/66 mmHg  Pulse 78  Temp(Src) 98.7 F (37.1 C) (Oral)  Resp 25  Ht 6\' 3"  (1.905 m)  Wt 175 lb (79.379 kg)  BMI 21.87 kg/m2  SpO2 98% Physical Exam  Constitutional: He is oriented to person, place, and time. He appears well-developed and well-nourished. No distress.  HENT:  Head: Normocephalic and atraumatic.  Mouth/Throat: Oropharynx is clear and moist.  Eyes: Pupils are equal, round, and reactive to light.  Neck: Normal range of motion. Neck supple.  Cardiovascular: Normal rate, regular rhythm and normal heart sounds.  Exam reveals no gallop and no friction rub.   No murmur heard. Pulmonary/Chest: Effort normal and breath sounds normal. No respiratory distress.  Abdominal: Soft. Bowel sounds are normal. He exhibits no distension. There is no tenderness.  Musculoskeletal: He exhibits no edema.       Left ankle: He exhibits decreased range of motion and swelling. He exhibits no ecchymosis, no deformity, no laceration and normal pulse. Tenderness. Lateral malleolus tenderness found. Achilles tendon normal.  Neurological: He is alert and oriented to person, place, and time. He exhibits normal muscle tone. Coordination normal.  Skin: Skin is warm and dry. No rash noted. No erythema.  Psychiatric: He has a normal mood and affect. His behavior is normal.  Nursing note and vitals reviewed.   ED Course  Procedures (including critical care time) Labs Review Labs Reviewed  CBC - Abnormal; Notable for the following:    Hemoglobin 12.5 (*)    HCT 38.1 (*)    All other components within normal limits  BASIC METABOLIC PANEL - Abnormal; Notable for the following:    Calcium 8.8 (*)    All other components within normal limits  CBG MONITORING, ED - Abnormal;  Notable for the following:    Glucose-Capillary 131 (*)    All other components within normal limits    Imaging Review Dg Ankle Complete Left  03/08/2015   CLINICAL DATA:  34 year old male with syncope and fall today. Lateral ankle pain. Initial encounter. Personal history of ankle surgery several months ago.  EXAM: LEFT ANKLE COMPLETE - 3+ VIEW  COMPARISON:  Left foot an ankle 11/19/2014.  FINDINGS: Sequelae of left calcaneus ORIF. One of the calcaneal cortical screws is fractured (arrow). Evidence of ankle joint effusion similar to the appearance in March. Mortise joint alignment preserved. Talar dome intact. Distal tibia and fibula appear intact. Evidence of interval healing of the calcaneus. There is a small ossific fragment at the posterior talus again noted and now this appears more corticated consistent with previous avulsion. No superimposed acute fracture or dislocation identified.  IMPRESSION: 1. No acute fracture or dislocation identified about the left ankle. 2. Chronic joint effusion suspected. 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 3. Previous calcaneus ORIF. One of the cortical screws has fractured.   Electronically Signed   By: Odessa FlemingH  Hall M.D.   On: 03/08/2015 18:19   Ct Head Wo Contrast  03/08/2015   CLINICAL DATA:  Syncopal episode with fall  EXAM: CT HEAD WITHOUT CONTRAST  CT CERVICAL SPINE WITHOUT CONTRAST  TECHNIQUE: Multidetector CT imaging of the head and cervical spine was performed following the standard protocol without intravenous contrast. Multiplanar CT image reconstructions of the cervical spine were also generated.  COMPARISON:  Head CT February 17, 2015 ; cervical spine CT July 08, 2012; cervical MRI November 02, 2014  FINDINGS: CT HEAD FINDINGS  The ventricles are normal in size and configuration. There is no intracranial mass, hemorrhage, extra-axial fluid collection, or midline shift. Gray-white compartments are normal. No acute infarct evident. Bony calvarium  appears intact. The mastoid air cells clear. There is opacification of multiple ethmoid air cells bilaterally, more severe on the right than on the left. There is also slight anterior mucosal thickening in the right sphenoid sinus.  CT CERVICAL SPINE FINDINGS  There is no fracture or spondylolisthesis. Prevertebral soft tissues and predental space regions are normal. The disc spaces appear intact. There is stable mild calcification in the anterior longitudinal ligament at C4-5 and C5-6. There is no nerve root edema or effacement. No disc extrusion or stenosis. There is a focal area of calcification in the right carotid artery.  IMPRESSION: CT head: Areas of paranasal sinus disease. Study otherwise unremarkable.  CT cervical spine: No fracture or spondylolisthesis. No appreciable disc space narrowing. No nerve root edema or effacement. Small focus of calcification in the right carotid artery.   Electronically Signed   By: Bretta BangWilliam  Woodruff III M.D.   On: 03/08/2015 17:41   Ct Cervical Spine Wo Contrast  03/08/2015   CLINICAL DATA:  Syncopal episode with fall  EXAM: CT HEAD WITHOUT CONTRAST  CT CERVICAL SPINE WITHOUT CONTRAST  TECHNIQUE: Multidetector CT imaging of the head and cervical spine was performed following the standard protocol without intravenous contrast. Multiplanar CT image reconstructions of the cervical spine were also generated.  COMPARISON:  Head CT February 17, 2015 ; cervical spine CT July 08, 2012; cervical MRI November 02, 2014  FINDINGS: CT HEAD FINDINGS  The ventricles are normal in size and configuration. There is no intracranial mass, hemorrhage, extra-axial fluid collection, or midline shift. Gray-white compartments are normal. No acute infarct evident. Bony calvarium appears intact. The mastoid air cells clear. There is opacification of multiple ethmoid air cells bilaterally, more severe on the right than on the left. There is also slight anterior mucosal thickening in the right sphenoid  sinus.  CT CERVICAL SPINE FINDINGS  There is no fracture or spondylolisthesis. Prevertebral soft tissues and predental space regions are normal. The disc spaces appear intact. There is stable mild calcification in the anterior longitudinal ligament at C4-5 and C5-6. There is no nerve root edema or effacement. No disc extrusion or stenosis. There is a focal area of calcification in the right carotid artery.  IMPRESSION: CT head: Areas of paranasal sinus disease. Study otherwise unremarkable.  CT cervical spine: No fracture or spondylolisthesis. No appreciable disc space narrowing. No nerve root edema or effacement. Small focus of calcification in the right carotid artery.   Electronically Signed   By: Bretta Bang III M.D.   On: 03/08/2015 17:41     EKG Interpretation   Date/Time:  Thursday March 08 2015 15:24:43 EDT Ventricular Rate:  81 PR Interval:  139 QRS Duration: 85 QT Interval:  405 QTC Calculation: 470 R Axis:   71 Text Interpretation:  Sinus rhythm Consider left ventricular hypertrophy  ST elev, probable normal early repol pattern Borderline prolonged QT  interval which is new, otherwise unchanged.  Confirmed by DOCHERTY  MD,  MEGAN [phone]) on 03/08/2015 3:35:15 PM       The patient does not want to go home because he states he is unable to take care of himself.  I advised the patient that there is no reason for admission to the hospital at this time.  Told to return here as needed.  I also advised him that he needs to follow with the primary care Dr. patient's questions were answered  Sampson Self, PA-C 03/10/15 [id number]  Toy Cookey, MD 03/10/15 [phone]

## 2015-03-08 NOTE — Discharge Instructions (Signed)
Return here as needed.  Follow-up with the primary care doctor.  Your testing here tonight did not show any significant abnormality

## 2015-03-12 ENCOUNTER — Emergency Department (HOSPITAL_COMMUNITY)
Admission: EM | Admit: 2015-03-12 | Discharge: 2015-03-12 | Discharge status: Against Medical Advice | Payer: Medicaid Other | Attending: Emergency Medicine | Admitting: Emergency Medicine

## 2015-03-12 ENCOUNTER — Emergency Department (HOSPITAL_COMMUNITY): Payer: Medicaid Other

## 2015-03-12 ENCOUNTER — Encounter (HOSPITAL_COMMUNITY): Payer: Self-pay | Admitting: Emergency Medicine

## 2015-03-12 DIAGNOSIS — S99912A Unspecified injury of left ankle, initial encounter: Secondary | ICD-10-CM | POA: Diagnosis present

## 2015-03-12 DIAGNOSIS — Z8659 Personal history of other mental and behavioral disorders: Secondary | ICD-10-CM | POA: Diagnosis not present

## 2015-03-12 DIAGNOSIS — Z8781 Personal history of (healed) traumatic fracture: Secondary | ICD-10-CM | POA: Diagnosis not present

## 2015-03-12 DIAGNOSIS — Y998 Other external cause status: Secondary | ICD-10-CM | POA: Diagnosis not present

## 2015-03-12 DIAGNOSIS — J45901 Unspecified asthma with (acute) exacerbation: Secondary | ICD-10-CM

## 2015-03-12 DIAGNOSIS — Y9389 Activity, other specified: Secondary | ICD-10-CM | POA: Insufficient documentation

## 2015-03-12 DIAGNOSIS — G8929 Other chronic pain: Secondary | ICD-10-CM | POA: Diagnosis not present

## 2015-03-12 DIAGNOSIS — J45909 Unspecified asthma, uncomplicated: Secondary | ICD-10-CM

## 2015-03-12 DIAGNOSIS — Y9289 Other specified places as the place of occurrence of the external cause: Secondary | ICD-10-CM | POA: Diagnosis not present

## 2015-03-12 DIAGNOSIS — M25572 Pain in left ankle and joints of left foot: Secondary | ICD-10-CM

## 2015-03-12 DIAGNOSIS — R0789 Other chest pain: Secondary | ICD-10-CM

## 2015-03-12 DIAGNOSIS — S93402A Sprain of unspecified ligament of left ankle, initial encounter: Secondary | ICD-10-CM | POA: Diagnosis not present

## 2015-03-12 DIAGNOSIS — X58XXXA Exposure to other specified factors, initial encounter: Secondary | ICD-10-CM | POA: Insufficient documentation

## 2015-03-12 DIAGNOSIS — Z720 Tobacco use: Secondary | ICD-10-CM | POA: Diagnosis not present

## 2015-03-12 DIAGNOSIS — R0602 Shortness of breath: Secondary | ICD-10-CM

## 2015-03-12 MED ORDER — PREDNISONE 20 MG PO TABS
60.0000 mg | ORAL_TABLET | Freq: Once | ORAL | Status: AC
  Administered 2015-03-12: 60 mg via ORAL
  Filled 2015-03-12: qty 3

## 2015-03-12 MED ORDER — MORPHINE SULFATE 4 MG/ML IJ SOLN
4.0000 mg | Freq: Once | INTRAMUSCULAR | Status: AC
  Administered 2015-03-12: 4 mg via INTRAVENOUS
  Filled 2015-03-12: qty 1

## 2015-03-12 MED ORDER — PREDNISONE 20 MG PO TABS: ORAL_TABLET | ORAL | Status: DC

## 2015-03-12 MED ORDER — IPRATROPIUM BROMIDE 0.02 % IN SOLN
0.5000 mg | Freq: Once | RESPIRATORY_TRACT | Status: AC
  Administered 2015-03-12: 0.5 mg via RESPIRATORY_TRACT
  Filled 2015-03-12: qty 2.5

## 2015-03-12 MED ORDER — NAPROXEN 500 MG PO TABS: 500.0000 mg | ORAL_TABLET | Freq: Two times a day (BID) | ORAL | Status: DC | PRN

## 2015-03-12 MED ORDER — ALBUTEROL SULFATE HFA 108 (90 BASE) MCG/ACT IN AERS: 2.0000 | INHALATION_SPRAY | RESPIRATORY_TRACT | Status: DC | PRN

## 2015-03-12 MED ORDER — ALBUTEROL SULFATE (2.5 MG/3ML) 0.083% IN NEBU
5.0000 mg | INHALATION_SOLUTION | Freq: Once | RESPIRATORY_TRACT | Status: AC
Start: 2015-03-12 — End: 2015-03-12
  Administered 2015-03-12: 5 mg via RESPIRATORY_TRACT
  Filled 2015-03-12: qty 6

## 2015-03-12 NOTE — ED Provider Notes (Signed)
CSN: [id number]     Arrival date & time 03/12/15  0600 History   First MD Initiated Contact with Patient 03/12/15 [phone]     Chief Complaint  Patient presents with  . Asthma  . Shortness of Breath  . Ankle Pain     (Consider location/radiation/quality/duration/timing/severity/associated sxs/prior Treatment) HPI Comments: Samara DeistChristopher B Glaab is a 34 y.o. male with a PMHx of asthma, anxiety, and heroin addiction, with a PSHx of L calcaneal fx ORIF on 10/05/14, who presents to the ED with complaints of asthma exacerbation that began approximately 2 hours prior to arrival. His symptoms include shortness of breath, chest congestion, and wheezing. He states that his apartment complex as painting and there is poor ventilation, which has caused his symptoms today. Additionally reports 7/10 left-sided constant sharp chest pain which is nonradiating, worse with coughing, with no treatments tried prior to arrival. He was given 2 nebulizer treatments with minimal improvement in his wheezing. He also states that when he was trying to get to the ambulance, he rolled his left ankle causing increase in his chronic pain. He denies any fevers, chills, URI symptoms, cough, leg swelling, recent travel/surgery/immobilization, history of PE/DVT, abdominal pain, nausea, vomiting, diarrhea, constipation, dysuria, hematuria, numbness, tingling, weakness, lightheadedness, or diaphoresis. Denies any sick contacts.  Patient is a 34 y.o. male presenting with asthma, shortness of breath, and ankle pain. The history is provided by the patient. No language interpreter was used.  Asthma This is a recurrent problem. The current episode started today. The problem occurs intermittently. The problem has been unchanged. Associated symptoms include arthralgias (L ankle), chest pain (L sided) and joint swelling (L ankle). Pertinent negatives include no abdominal pain, chills, coughing, diaphoresis, fever, myalgias, nausea, numbness, sore  throat, vomiting or weakness. Exacerbated by: apartment complex painting with poor ventilation. Treatments tried: 2 nebs en route. The treatment provided mild relief.  Shortness of Breath Associated symptoms: chest pain (L sided) and wheezing   Associated symptoms: no abdominal pain, no cough, no diaphoresis, no ear pain, no fever, no sore throat and no vomiting   Ankle Pain Associated symptoms: no fever     Past Medical History  Diagnosis Date  . Asthma   . Anxiety   . Heroin addiction    Past Surgical History  Procedure Laterality Date  . Orif calcaneal fracture Left 10/05/2014   Family History  Problem Relation Age of Onset  . Hypertension Father   . Hypertension Mother    History  Substance Use Topics  . Smoking status: Current Every Day Smoker -- 0.50 packs/day    Types: Cigarettes  . Smokeless tobacco: Never Used  . Alcohol Use: No    Review of Systems  Constitutional: Negative for fever, chills and diaphoresis.  HENT: Negative for ear discharge, ear pain, rhinorrhea and sore throat.   Respiratory: Positive for shortness of breath and wheezing. Negative for cough.   Cardiovascular: Positive for chest pain (L sided). Negative for leg swelling.  Gastrointestinal: Negative for nausea, vomiting, abdominal pain, diarrhea and constipation.  Genitourinary: Negative for dysuria and hematuria.  Musculoskeletal: Positive for joint swelling (L ankle) and arthralgias (L ankle). Negative for myalgias.  Skin: Negative for color change and wound.  Allergic/Immunologic: Negative for immunocompromised state.  Neurological: Negative for weakness, light-headedness and numbness.  Psychiatric/Behavioral: Negative for confusion.   10 Systems reviewed and are negative for acute change except as noted in the HPI.    Allergies  Review of patient's allergies indicates no known allergies.  Home Medications   Prior to Admission medications   Medication Sig Start Date End Date Taking?  Authorizing Provider  ibuprofen (ADVIL,MOTRIN) 600 MG tablet Take 1 tablet (600 mg total) by mouth every 6 (six) hours as needed for moderate pain. 11/21/14   Alexa Dulcy Fanny, MD  oxyCODONE (ROXICODONE) 15 MG immediate release tablet Take 1 tablet (15 mg total) by mouth daily as needed. Prior to or after PT sessions 11/24/14   Alexa Dulcy Fanny, MD  oxyCODONE 10 MG TABS Take 1 tablet (10 mg total) by mouth every 4 (four) hours as needed (pain). 11/24/14   Alexa Dulcy Fanny, MD   BP 150/76 mmHg  Pulse 72  Temp(Src) 97.8 F (36.6 C) (Oral)  Resp 22  SpO2 100% Physical Exam  Constitutional: He is oriented to person, place, and time. Vital signs are normal. He appears well-developed and well-nourished.  Non-toxic appearance. No distress.  Afebrile, nontoxic, NAD  HENT:  Head: Normocephalic and atraumatic.  Mouth/Throat: Oropharynx is clear and moist and mucous membranes are normal.  Eyes: Conjunctivae and EOM are normal. Right eye exhibits no discharge. Left eye exhibits no discharge.  Neck: Normal range of motion. Neck supple.  Cardiovascular: Normal rate, regular rhythm, normal heart sounds and intact distal pulses.  Exam reveals no gallop and no friction rub.   No murmur heard. RRR, nl s1/s2, no m/r/g, distal pulses intact, no pedal edema   Pulmonary/Chest: Effort normal. No respiratory distress. He has decreased breath sounds in the right lower field and the left lower field. He has wheezes. He has no rhonchi. He has no rales. He exhibits tenderness. He exhibits no crepitus, no deformity and no retraction.    Wheezing in upper fields with diminished breath sounds in lower fields, no hypoxia or increased WOB, speaking in full sentences, SpO2 97% on RA Mild chest wall TTP over L chest, no crepitus or deformities, no retractions  Abdominal: Soft. Normal appearance and bowel sounds are normal. He exhibits no distension. There is no tenderness. There is no rigidity, no rebound, no guarding,  no CVA tenderness, no tenderness at McBurney's point and negative Murphy's sign.  Musculoskeletal: Normal range of motion.       Left ankle: He exhibits swelling. He exhibits normal range of motion, no ecchymosis, no deformity and normal pulse. Tenderness. Lateral malleolus tenderness found. Achilles tendon normal.       Feet:  L ankle with FROM intact, mild swelling without deformity, with mild TTP of lateral malleolus and calcaneus, but no TTP or swelling of fore foot or calf. No break in skin. No bruising or erythema. No warmth. Achilles intact. Good pedal pulse and cap refill of all toes. Wiggling toes without difficulty. Sensation grossly intact.   Neurological: He is alert and oriented to person, place, and time. He has normal strength. No sensory deficit.  Skin: Skin is warm, dry and intact. No rash noted.  Psychiatric: He has a normal mood and affect.  Nursing note and vitals reviewed.   ED Course  Procedures (including critical care time) Labs Review Labs Reviewed  CBC WITH DIFFERENTIAL/PLATELET  BASIC METABOLIC PANEL  Rosezena Sensor, ED    Imaging Review Dg Chest 2 View  03/12/2015   CLINICAL DATA:  Asthma, shortness of breath, and congestion for a few days. Pain and swelling in the left ankle after twisting injury.  EXAM: CHEST  2 VIEW  COMPARISON:  02/17/2015  FINDINGS: The heart size and mediastinal contours are within normal limits. Both lungs  are clear. The visualized skeletal structures are unremarkable.  IMPRESSION: No active cardiopulmonary disease.   Electronically Signed   By: Burman Nieves M.D.   On: 03/12/2015 06:57   Dg Ankle Complete Left  03/12/2015   CLINICAL DATA:  Twisted left ankle this morning. Swelling and pain laterally. History of ankle surgery a few weeks ago.  EXAM: LEFT ANKLE COMPLETE - 3+ VIEW  COMPARISON:  03/08/2015  FINDINGS: Postoperative changes with plate and screw fixation of old fractures of the left calcaneus. Two of the fixation screws are  fractured. Appearance is unchanged since the previous study. 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 Old ununited ossicle over the proximal superior calcaneus. These findings are also unchanged. No evidence of acute fracture or dislocation. Mild lateral soft tissue swelling is noted.  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 Fractures of some of the fixation screws in the calcaneus. No change since prior study. No acute bony abnormalities identified.   Electronically Signed   By: Burman Nieves M.D.   On: 03/12/2015 07:01     EKG Interpretation   Date/Time:  Monday March 12 2015 06:05:42 EDT Ventricular Rate:  65 PR Interval:  162 QRS Duration: 90 QT Interval:  406 QTC Calculation: 422 R Axis:   82 Text Interpretation:  Sinus rhythm Consider left ventricular hypertrophy  ST elev, probable normal early repol pattern No significant change was  found Confirmed by CAMPOS  MD, KEVIN ([id number]) on 03/12/2015 8:01:22 AM      MDM   Final diagnoses:  Left ankle pain  Asthma  Asthma exacerbation  SOB (shortness of breath)  Musculoskeletal chest pain  Ankle sprain, left, initial encounter    34 y.o. male here with SOB/chest congestion/wheezing related to being in an apt complex that is currently being painted but has poor ventilation, thinks it triggered his asthma. Also has L sided CP worse with coughing and reproducible on exam, likely from asthma exacerbation. On exam, wheezing in upper fields with diminished breath sounds in lower fields, no hypoxia or increased WOB, no tachycardia. Doubt PE. Will treat as asthma exacerbation, but get basic labs and EKG/trop/CXR. Will give nebs and prednisone, has rec'd 2 nebs in transit therefore next step would be CAT neb. Also complaining of rolling his L ankle, recently had xray of ankle for similar complaint but pt  is tender over lateral malleolus and calcaneus, will obtain xray. Will give pain meds. Will reassess shortly.   9:56 AM CXR clear, EKG unchanged from prior, Ankle xray without any acute findings. Will treat ankle pain as a sprain, given ASO splint and crutches. Discussed f/up with his orthopedist and he states he doesn't want to see that doctor and would like referral to somewhere else. Will give on call provider but discussed that they may still ask the pt to see his prior orthopedist. Discussed tylenol/motrin for pain, will not give any more narcotics given his chronic narcotic scripts and concern regarding his drug seeking behaviors documented in his notes. Lung sounds improved. Will send home with prednisone and inhaler. Still awaiting labs. Will reassess shortly.   10:36 AM Pt eloped. Scripts written and left in case he returns.   BP 138/60 mmHg  Pulse 89  Temp(Src) 99.3 F (37.4 C) (Oral)  Resp 16  SpO2 99%  Meds ordered this encounter  Medications  . albuterol (PROVENTIL) (2.5 MG/3ML) 0.083% nebulizer  solution 5 mg    Sig:   . ipratropium (ATROVENT) nebulizer solution 0.5 mg    Sig:   . predniSONE (DELTASONE) tablet 60 mg    Sig:   . morphine 4 MG/ML injection 4 mg    Sig:   . predniSONE (DELTASONE) 20 MG tablet    Sig: 3 tabs po daily x 4 days    Dispense:  12 tablet    Refill:  0    Order Specific Question:  Supervising Provider    Answer:  MILLER, BRIAN [[id number]]  . albuterol (PROVENTIL HFA;VENTOLIN HFA) 108 (90 BASE) MCG/ACT inhaler    Sig: Inhale 2 puffs into the lungs every 2 (two) hours as needed for wheezing or shortness of breath (cough).    Dispense:  1 Inhaler    Refill:  0    Order Specific Question:  Supervising Provider    Answer:  MILLER, BRIAN [[id number]]  . naproxen (NAPROSYN) 500 MG tablet    Sig: Take 1 tablet (500 mg total) by mouth 2 (two) times daily as needed for mild pain, moderate pain or headache (TAKE WITH MEALS.).    Dispense:  20 tablet    Refill:   0    Order Specific Question:  Supervising Provider    Answer:  Eber Hong [address] Najia Hurlbutt Camprubi-Soms, PA-C 03/12/15 [id number]  Azalia Bilis, MD 03/15/15 [phone]

## 2015-03-12 NOTE — ED Notes (Signed)
Spoke with pt and received permission to send letter to pts lawyer stating he was being seen in ED at this time.

## 2015-03-12 NOTE — ED Notes (Signed)
IV team at bedside 

## 2015-03-12 NOTE — ED Notes (Signed)
PA at bedside.

## 2015-03-12 NOTE — ED Notes (Signed)
Pt presents with EMS for sob and ankle pain; pt reports increasing sob tonight with wheezes throughout all lung fields; pt also "rolled" LEFT ankle when leaving residence to meet ambulance- swelling noted

## 2015-03-12 NOTE — ED Notes (Signed)
Patient transported to X-ray 

## 2015-03-12 NOTE — Discharge Instructions (Signed)
Wear ankle brace for at least 2 weeks for stabilization of ankle. Use crutches as needed for comfort. Ice and elevate ankle throughout the day. Alternate between naprosyn and tylenol for pain. Call orthopedic follow up today or tomorrow to schedule followup appointment for recheck of ongoing ankle pain in 1-2 weeks that can be canceled with a 24-48 hour notice if complete resolution of pain. Continue to stay well-hydrated. Use Mucinex for cough suppression/expectoration of mucus. Use netipot and flonase to help with nasal congestion. May consider over-the-counter Benadryl or other antihistamine to decrease secretions and for watery itchy eyes. Use inhaler as directed, as needed for cough/chest congestion. Take prednisone as directed. Followup with [facility] in 5-7 days for recheck of ongoing symptoms and to establish care. Return to emergency department for emergent changing or worsening of symptoms.   Ankle Sprain An ankle sprain is an injury to the strong, fibrous tissues (ligaments) that hold your ankle bones together.  HOME CARE   Put ice on your ankle for 1-2 days or as told by your doctor.  Put ice in a plastic bag.  Place a towel between your skin and the bag.  Leave the ice on for 15-20 minutes at a time, every 2 hours while you are awake.  Only take medicine as told by your doctor.  Raise (elevate) your injured ankle above the level of your heart as much as possible for 2-3 days.  Use crutches if your doctor tells you to. Slowly put your own weight on the affected ankle. Use the crutches until you can walk without pain.  If you have a plaster splint:  Do not rest it on anything harder than a pillow for 24 hours.  Do not put weight on it.  Do not get it wet.  Take it off to shower or bathe.  If given, use an elastic wrap or support stocking for support. Take the wrap off if your toes lose feeling (numb), tingle, or turn cold or blue.  If you have an air  splint:  Add or let out air to make it comfortable.  Take it off at night and to shower and bathe.  Wiggle your toes and move your ankle up and down often while you are wearing it. GET HELP IF:  You have rapidly increasing bruising or puffiness (swelling).  Your toes feel very cold.  You lose feeling in your foot.  Your medicine does not help your pain. GET HELP RIGHT AWAY IF:   Your toes lose feeling (numb) or turn blue.  You have severe pain that is increasing. MAKE SURE YOU:   Understand these instructions.  Will watch your condition.  Will get help right away if you are not doing well or get worse. Document Released: 02/04/2008 Document Revised: 01/02/2014 Document Reviewed: 03/01/2012 [facility] Patient Information 2015 [place], [place]. This information is not intended to replace advice given to you by your health care provider. Make sure you discuss any questions you have with your health care provider.  Asthma Asthma is a condition of the lungs in which the airways tighten and narrow. Asthma can make it hard to breathe. Asthma cannot be cured, but medicine and lifestyle changes can help control it. Asthma may be started (triggered) by:  Animal skin flakes (dander).  Dust.  Cockroaches.  Pollen.  Mold.  Smoke.  Cleaning products.  Hair sprays or aerosol sprays.  Paint fumes or strong smells.  Cold air, weather changes, and winds.  Crying or  laughing hard.  Stress.  Certain medicines or drugs.  Foods, such as dried fruit, potato chips, and sparkling grape juice.  Infections or conditions (colds, flu).  Exercise.  Certain medical conditions or diseases.  Exercise or tiring activities. HOME CARE   Take medicine as told by your doctor.  Use a peak flow meter as told by your doctor. A peak flow meter is a tool that measures how well the lungs are working.  Record and keep track of the peak flow meter's readings.  Understand and use the  asthma action plan. An asthma action plan is a written plan for taking care of your asthma and treating your attacks.  To help prevent asthma attacks:  Do not smoke. Stay away from secondhand smoke.  Change your heating and air conditioning filter often.  Limit your use of fireplaces and wood stoves.  Get rid of pests (such as roaches and mice) and their droppings.  Throw away plants if you see mold on them.  Clean your floors. Dust regularly. Use cleaning products that do not smell.  Have someone vacuum when you are not home. Use a vacuum cleaner with a HEPA filter if possible.  Replace carpet with wood, tile, or vinyl flooring. Carpet can trap animal skin flakes and dust.  Use allergy-proof pillows, mattress covers, and box spring covers.  Wash bed sheets and blankets every week in hot water and dry them in a dryer.  Use blankets that are made of polyester or cotton.  Clean bathrooms and kitchens with bleach. If possible, have someone repaint the walls in these rooms with mold-resistant paint. Keep out of the rooms that are being cleaned and painted.  Wash hands often. GET HELP IF:  You have make a whistling sound when breaking (wheeze), have shortness of breath, or have a cough even if taking medicine to prevent attacks.  The colored mucus you cough up (sputum) is thicker than usual.  The colored mucus you cough up changes from clear or white to yellow, green, gray, or bloody.  You have problems from the medicine you are taking such as:  A rash.  Itching.  Swelling.  Trouble breathing.  You need reliever medicines more than 2-3 times a week.  Your peak flow measurement is still at 50-79% of your personal best after following the action plan for 1 hour.  You have a fever. GET HELP RIGHT AWAY IF:   You seem to be worse and are not responding to medicine during an asthma attack.  You are short of breath even at rest.  You get short of breath when doing very  little activity.  You have trouble eating, drinking, or talking.  You have chest pain.  You have a fast heartbeat.  Your lips or fingernails start to turn blue.  You are light-headed, dizzy, or faint.  Your peak flow is less than 50% of your personal best. MAKE SURE YOU:   Understand these instructions.  Will watch your condition.  Will get help right away if you are not doing well or get worse. Document Released: 02/04/2008 Document Revised: 01/02/2014 Document Reviewed: 03/17/2013 [facility] Patient Information 2015 [place]. This information is not intended to replace advice given to you by your health care provider. Make sure you discuss any questions you have with your health care provider.  Chest Wall Pain Chest wall pain is pain in or around the bones and muscles of your chest. It may take up to 6 weeks to get better.  It may take longer if you must stay physically active in your work and activities.  CAUSES  Chest wall pain may happen on its own. However, it may be caused by:  A viral illness like the flu.  Injury.  Coughing.  Exercise.  Arthritis.  Fibromyalgia.  Shingles. HOME CARE INSTRUCTIONS   Avoid overtiring physical activity. Try not to strain or perform activities that cause pain. This includes any activities using your chest or your abdominal and side muscles, especially if heavy weights are used.  Put ice on the sore area.  Put ice in a plastic bag.  Place a towel between your skin and the bag.  Leave the ice on for 15-20 minutes per hour while awake for the first 2 days.  Only take over-the-counter or prescription medicines for pain, discomfort, or fever as directed by your caregiver. SEEK IMMEDIATE MEDICAL CARE IF:   Your pain increases, or you are very uncomfortable.  You have a fever.  Your chest pain becomes worse.  You have new, unexplained symptoms.  You have nausea or vomiting.  You feel sweaty or lightheaded.  You  have a cough with phlegm (sputum), or you cough up blood. MAKE SURE YOU:   Understand these instructions.  Will watch your condition.  Will get help right away if you are not doing well or get worse. Document Released: 08/18/2005 Document Revised: 11/10/2011 Document Reviewed: 04/14/2011 [facility] Patient Information 2015 [place], [place]. This information is not intended to replace advice given to you by your health care provider. Make sure you discuss any questions you have with your health care provider.

## 2015-03-12 NOTE — Progress Notes (Signed)
Orthopedic Tech Progress Note Patient Details:  Samara DeistChristopher B Savitz October 04, 1980 [id number]  Ortho Devices Type of Ortho Device: ASO, Crutches Ortho Device/Splint Interventions: Application   Cammer, Mickie BailJennifer Carol 03/12/2015, 8:34 AM

## 2015-03-12 NOTE — ED Notes (Signed)
Ortho tech paged  

## 2015-03-25 ENCOUNTER — Encounter (HOSPITAL_BASED_OUTPATIENT_CLINIC_OR_DEPARTMENT_OTHER): Payer: Self-pay | Admitting: Emergency Medicine

## 2015-03-25 ENCOUNTER — Emergency Department (HOSPITAL_BASED_OUTPATIENT_CLINIC_OR_DEPARTMENT_OTHER)
Admission: EM | Admit: 2015-03-25 | Discharge: 2015-03-26 | Disposition: A | Discharge status: Home / Self Care | Payer: Medicaid Other | Attending: Emergency Medicine | Admitting: Emergency Medicine

## 2015-03-25 DIAGNOSIS — J45901 Unspecified asthma with (acute) exacerbation: Secondary | ICD-10-CM | POA: Diagnosis not present

## 2015-03-25 DIAGNOSIS — Z720 Tobacco use: Secondary | ICD-10-CM | POA: Insufficient documentation

## 2015-03-25 DIAGNOSIS — W1839XA Other fall on same level, initial encounter: Secondary | ICD-10-CM | POA: Insufficient documentation

## 2015-03-25 DIAGNOSIS — Z8659 Personal history of other mental and behavioral disorders: Secondary | ICD-10-CM | POA: Insufficient documentation

## 2015-03-25 DIAGNOSIS — S99912A Unspecified injury of left ankle, initial encounter: Secondary | ICD-10-CM | POA: Diagnosis present

## 2015-03-25 DIAGNOSIS — Y9389 Activity, other specified: Secondary | ICD-10-CM | POA: Insufficient documentation

## 2015-03-25 DIAGNOSIS — S93402A Sprain of unspecified ligament of left ankle, initial encounter: Secondary | ICD-10-CM | POA: Diagnosis not present

## 2015-03-25 DIAGNOSIS — Y9289 Other specified places as the place of occurrence of the external cause: Secondary | ICD-10-CM | POA: Diagnosis not present

## 2015-03-25 DIAGNOSIS — Z79899 Other long term (current) drug therapy: Secondary | ICD-10-CM | POA: Diagnosis not present

## 2015-03-25 DIAGNOSIS — Y998 Other external cause status: Secondary | ICD-10-CM | POA: Diagnosis not present

## 2015-03-25 NOTE — ED Notes (Addendum)
Per ems: Patient was possibly escaping arrest and went to a hotel that he used to live at when he  fell and hurt his left foot.. After the fall the patient was found to have audible wheezing and SOB, patient brought in via ems with Breathing treatment. Audible expiratory upper airway wheezing with forced exhalation on arrival.

## 2015-03-25 NOTE — ED Notes (Addendum)
When triageing the patient the patient reports that he has been outside all day playing [place] [employer] [place] and [place], the patient reports that he went to visit a friend at the hotel and he became dizzy and lighthead. He reports that he fell and hurt his left ankle. Swelling noted to the left ankle, patient has mild congested cough

## 2015-03-26 ENCOUNTER — Emergency Department (HOSPITAL_BASED_OUTPATIENT_CLINIC_OR_DEPARTMENT_OTHER): Payer: Medicaid Other

## 2015-03-26 MED ORDER — ALBUTEROL SULFATE HFA 108 (90 BASE) MCG/ACT IN AERS
2.0000 | INHALATION_SPRAY | RESPIRATORY_TRACT | Status: DC | PRN
  Administered 2015-03-26: 2 via RESPIRATORY_TRACT
  Filled 2015-03-26: qty 6.7

## 2015-03-26 NOTE — ED Notes (Signed)
Assumed care of patient from [facility] - pt is awaiting EDP evaluation. Left ankle swollen, elevated, ice applied. No distress. VSS. CMS intact.

## 2015-03-26 NOTE — ED Provider Notes (Signed)
CSN: [id number]     Arrival date & time 03/25/15  2346 History   First MD Initiated Contact with Patient 03/26/15 0100     Chief Complaint  Patient presents with  . Ankle Injury     (Consider location/radiation/quality/duration/timing/severity/associated sxs/prior Treatment) HPI  This is a 34 year old male who states he twisted his left ankle about 3 hours ago. He was also having an asthma attack. He was given a neb treatment prior to arrival with relief of his dyspnea. He is no longer wheezing. He states he is having moderate to severe pain in his left ankle, primarily in the posterior aspect of it. There is associated swelling but no deformity. There is no functional deficit but range of motion is limited due to pain. It is noted that this is his third visit to a [facility] ED this month for left ankle injury. He states he has lost his albuterol inhaler.  Past Medical History  Diagnosis Date  . Asthma   . Anxiety   . Heroin addiction    Past Surgical History  Procedure Laterality Date  . Orif calcaneal fracture Left 10/05/2014   Family History  Problem Relation Age of Onset  . Hypertension Father   . Hypertension Mother    History  Substance Use Topics  . Smoking status: Current Every Day Smoker -- 0.50 packs/day    Types: Cigarettes  . Smokeless tobacco: Never Used  . Alcohol Use: No    Review of Systems  All other systems reviewed and are negative.   Allergies  Review of patient's allergies indicates no known allergies.  Home Medications   Prior to Admission medications   Medication Sig Start Date End Date Taking? Authorizing Provider  albuterol (PROVENTIL HFA;VENTOLIN HFA) 108 (90 BASE) MCG/ACT inhaler Inhale 2 puffs into the lungs every 2 (two) hours as needed for wheezing or shortness of breath (cough). 03/12/15   Mercedes Camprubi-Soms, PA-C  ibuprofen (ADVIL,MOTRIN) 600 MG tablet Take 1 tablet (600 mg total) by mouth every 6 (six) hours as needed for moderate  pain. 11/21/14   Alexa Dulcy Fanny, MD  naproxen (NAPROSYN) 500 MG tablet Take 1 tablet (500 mg total) by mouth 2 (two) times daily as needed for mild pain, moderate pain or headache (TAKE WITH MEALS.). 03/12/15   Mercedes Camprubi-Soms, PA-C  oxyCODONE (ROXICODONE) 15 MG immediate release tablet Take 1 tablet (15 mg total) by mouth daily as needed. Prior to or after PT sessions 11/24/14   Alexa Dulcy Fanny, MD  oxyCODONE 10 MG TABS Take 1 tablet (10 mg total) by mouth every 4 (four) hours as needed (pain). 11/24/14   Alexa Dulcy Fanny, MD  predniSONE (DELTASONE) 20 MG tablet 3 tabs po daily x 4 days 03/12/15   Mercedes Camprubi-Soms, PA-C   BP 140/74 mmHg  Pulse 81  Temp(Src) 98.3 F (36.8 C) (Oral)  Resp 22  Ht  (1.905 m)  Wt 180 lb (81.647 kg)  BMI 22.50 kg/m2  SpO2 100%   Physical Exam  General: Well-developed, well-nourished male in no acute distress; appearance consistent with age of record; poor personal hygiene HENT: normocephalic; atraumatic Eyes: pupils equal, round and reactive to light; extraocular muscles intact Neck: supple Heart: regular rate and rhythm Lungs: clear to auscultation bilaterally Abdomen: soft; nondistended Extremities: No deformity; swelling and tenderness of left ankle with decreased range of motion, left foot neurovascularly intact with intact tendon function Neurologic: Awake, alert and oriented; motor function intact in all extremities and symmetric; no facial  droop Skin: Warm and dry Psychiatric: Flat affect    ED Course  Procedures (including critical care time)   MDM  Nursing notes and vitals signs, including pulse oximetry, reviewed.  Summary of this visit's results, reviewed by myself:  Imaging Studies: Dg Ankle Complete Left  07-Apr-2015   CLINICAL DATA:  Left ankle pain after fall  EXAM: LEFT ANKLE COMPLETE - 3+ VIEW  COMPARISON:  03/12/2015  FINDINGS: Negative for acute fracture, dislocation or radiopaque foreign body. The mortise  is symmetric. There is prior calcaneal fixation. There are fractures of some of the fixation screws, unchanged.  IMPRESSION: Negative for acute fracture.   Electronically Signed   By: Ellery Plunk M.D.   On: 2015-04-07 01:51        Paula Libra, MD 2015/04/07 [phone]

## 2015-03-26 NOTE — ED Notes (Signed)
Portable Xray at bedside.

## 2015-03-26 NOTE — Discharge Instructions (Signed)

## 2015-05-01 ENCOUNTER — Encounter (HOSPITAL_COMMUNITY): Payer: Self-pay | Admitting: Emergency Medicine

## 2015-05-01 ENCOUNTER — Emergency Department (HOSPITAL_COMMUNITY)
Admission: EM | Admit: 2015-05-01 | Discharge: 2015-05-01 | Payer: Medicaid Other | Attending: Emergency Medicine | Admitting: Emergency Medicine

## 2015-05-01 DIAGNOSIS — L0291 Cutaneous abscess, unspecified: Secondary | ICD-10-CM

## 2015-05-01 DIAGNOSIS — L02414 Cutaneous abscess of left upper limb: Secondary | ICD-10-CM | POA: Insufficient documentation

## 2015-05-01 DIAGNOSIS — G8929 Other chronic pain: Secondary | ICD-10-CM | POA: Insufficient documentation

## 2015-05-01 DIAGNOSIS — L02413 Cutaneous abscess of right upper limb: Secondary | ICD-10-CM | POA: Diagnosis not present

## 2015-05-01 DIAGNOSIS — Z8659 Personal history of other mental and behavioral disorders: Secondary | ICD-10-CM | POA: Diagnosis not present

## 2015-05-01 DIAGNOSIS — J45909 Unspecified asthma, uncomplicated: Secondary | ICD-10-CM | POA: Diagnosis not present

## 2015-05-01 DIAGNOSIS — Z720 Tobacco use: Secondary | ICD-10-CM | POA: Insufficient documentation

## 2015-05-01 DIAGNOSIS — M25572 Pain in left ankle and joints of left foot: Secondary | ICD-10-CM | POA: Insufficient documentation

## 2015-05-01 DIAGNOSIS — L02611 Cutaneous abscess of right foot: Secondary | ICD-10-CM | POA: Diagnosis not present

## 2015-05-01 MED ORDER — DOXYCYCLINE HYCLATE 100 MG PO CAPS: 100.0000 mg | ORAL_CAPSULE | Freq: Two times a day (BID) | ORAL | Status: DC

## 2015-05-01 MED ORDER — ALBUTEROL SULFATE HFA 108 (90 BASE) MCG/ACT IN AERS
2.0000 | INHALATION_SPRAY | RESPIRATORY_TRACT | Status: DC | PRN
  Administered 2015-05-01: 2 via RESPIRATORY_TRACT
  Filled 2015-05-01: qty 6.7

## 2015-05-01 NOTE — ED Notes (Addendum)
Pt in with [employer] for medical clearance before going to jail. Presents with multiple abscesses to bil forearms and R foot. Pt is IV drug user but sts he has not used in 3-4 days because he has been on Suboxone. Normally only injects in medial AC vein of both arms but abscesses are in multiple areas and also follow vein up anterior FA. Reports sometimes the abscesses pop on their own and sometimes he pops them and "lots of white stuff comes out." Pt reports generalized pain related to "rods all over my body, it's not from coming off drugs, I know that."

## 2015-05-01 NOTE — Discharge Instructions (Signed)

## 2015-05-01 NOTE — ED Notes (Signed)
Pt awake, alert, brought in by [employer] for medical clearance for skin abscesses prior to going to jail.  Pt noted to have multiple abscesses to bilat arms of various stages.  Some open, approx dime sized.  Pt reports "was squeezing it and lot of stuff coming out of it".  Pt reports using heroin, last use yesterday.  Pt denies fevers/chills.  MAEI.  Ambulatory with steady gait.  Speaking full/clear sentences, rr even/un-lab.  NAD.

## 2015-05-01 NOTE — Progress Notes (Signed)
Pt informed CM he is not sure if he has medicaid Cm able to verify with ED Registration pt medicaid verification The below was discussed with pt and he was given a copy of his Medicaid ID and the below information Pt voiced understanding and lots of appreciation    Member Insurance ID: [id number] Q Date of Birth: 05/31/1981 Sex: Male Primary Care Copay: 3.00 Eligibility Begin Date: 05/01/2015    [facility] Department of Social Services Address: [address] [place], [place], [place] [address]  [phone]  DEPARTMENTS OF SOCIAL SERVICES/HEALTH DEPARTMENTS  [place]:  [place]: [phone] (main) [employer] [address] [place], [place] [address]  Medicaid Transportation: [phone] or [phone] Transportation Supervisor [phone]   If you decide you need to change providers, please contact DSS to see if the provider is a Medicaid provider prior to attempting to be seen. As a Medicaid client you MUST contact them each time you change address, move to another county or another state to keep your address updated

## 2015-05-01 NOTE — ED Provider Notes (Signed)
CSN: [id number]     Arrival date & time 05/01/15  1503 History   First MD Initiated Contact with Patient 05/01/15 1540     Chief Complaint  Patient presents with  . Abscess    multiple abscesses to bilat arms from using heroin     (Consider location/radiation/quality/duration/timing/severity/associated sxs/prior Treatment) HPI Comments: Patient brought to ED by [employer] for medical clearance prior to going to jail.  Patient has multiple small abscesses of varying age on upper extremities.  Patient has long history of IV heroin use.  Patient states that has not used in the past 3-4 days because he has been taking suboxone.  He denies fever.  He states that he has felt nauseated and has pain all over his body.  He also complains of chronic left ankle pain.  He has not had any new injuries.  The history is provided by the patient. No language interpreter was used.    Past Medical History  Diagnosis Date  . Asthma   . Anxiety   . Heroin addiction    Past Surgical History  Procedure Laterality Date  . Orif calcaneal fracture Left 10/05/2014   Family History  Problem Relation Age of Onset  . Hypertension Father   . Hypertension Mother    Social History  Substance Use Topics  . Smoking status: Current Every Day Smoker -- 0.50 packs/day    Types: Cigarettes  . Smokeless tobacco: Never Used  . Alcohol Use: No    Review of Systems  Constitutional: Negative for fever and chills.  Respiratory: Negative for shortness of breath.   Cardiovascular: Negative for chest pain.  Gastrointestinal: Negative for nausea, vomiting, diarrhea and constipation.  Genitourinary: Negative for dysuria.  Skin: Positive for wound.      Allergies  Review of patient's allergies indicates no known allergies.  Home Medications   Prior to Admission medications   Medication Sig Start Date End Date Taking? Authorizing Provider  oxyCODONE (ROXICODONE) 15 MG immediate release tablet Take 15 mg by mouth daily  with breakfast.   Yes Historical Provider, MD  Oxycodone HCl 10 MG TABS Take 10 mg by mouth every 4 (four) hours as needed (for pain).   Yes Historical Provider, MD  albuterol (PROVENTIL HFA;VENTOLIN HFA) 108 (90 BASE) MCG/ACT inhaler Inhale 2 puffs into the lungs every 2 (two) hours as needed for wheezing or shortness of breath (cough). Patient not taking: Reported on 05/01/2015 03/12/15   Mercedes Camprubi-Soms, PA-C  doxycycline (VIBRAMYCIN) 100 MG capsule Take 1 capsule (100 mg total) by mouth 2 (two) times daily. 05/01/15   Roxy Horseman, PA-C  ibuprofen (ADVIL,MOTRIN) 600 MG tablet Take 1 tablet (600 mg total) by mouth every 6 (six) hours as needed for moderate pain. Patient not taking: Reported on 05/01/2015 11/21/14   Alexa Dulcy Fanny, MD  naproxen (NAPROSYN) 500 MG tablet Take 1 tablet (500 mg total) by mouth 2 (two) times daily as needed for mild pain, moderate pain or headache (TAKE WITH MEALS.). Patient not taking: Reported on 05/01/2015 03/12/15   Mercedes Camprubi-Soms, PA-C   BP 144/85 mmHg  Pulse 97  Temp(Src) 97.5 F (36.4 C) (Oral)  Resp 18  Ht 6\' 2"  (1.88 m)  SpO2 99% Physical Exam  Constitutional: He is oriented to person, place, and time. He appears well-developed and well-nourished.  HENT:  Head: Normocephalic and atraumatic.  Eyes: Conjunctivae and EOM are normal.  Neck: Normal range of motion.  Cardiovascular: Normal rate.   Pulmonary/Chest: Effort normal.  Abdominal:  He exhibits no distension.  Musculoskeletal: Normal range of motion.  Neurological: He is alert and oriented to person, place, and time.  Skin: Skin is dry.  Multiple small abscesses of upper extremities, nothing requiring I&D at this time  Psychiatric: He has a normal mood and affect. His behavior is normal. Judgment and thought content normal.  Nursing note and vitals reviewed.   ED Course  Procedures (including critical care time) Labs Review Labs Reviewed - No data to display  Imaging  Review No results found. I have personally reviewed and evaluated these images and lab results as part of my medical decision-making.   EKG Interpretation None      MDM   Final diagnoses:  Abscess  Chronic pain  Chronic ankle pain, left    Patient with multiple abscesses of varying age on upper extremities.  There does not appear to be any severe cellulitis.  The abscesses have drained or are draining.  I am unable to express any further purulent discharge from his abscesses.  I do not see anything that needs to be I&D'd.  Patient is requesting IV abx.  I do not feel that patient needs IV abx at this time as he has not tried orals recently.  I do not think that patient requires any emergent admission or surgery.  VSS.  Will discharge with doxycycline.  Patient complains of having pain all over, which I believe to likely be from withdrawal.  Patient requesting to see a physician.  Notified Dr. Madilyn Hook, who will see the patient.  Patient given inhaler as a courtesy.  He does not currently have any wheezing or SOB.   Roxy Horseman, PA-C 05/01/15 1633  Tilden Fossa, MD 05/01/15 [phone]

## 2015-08-26 ENCOUNTER — Encounter (HOSPITAL_COMMUNITY): Payer: Self-pay

## 2015-08-26 ENCOUNTER — Emergency Department (HOSPITAL_COMMUNITY)
Admission: EM | Admit: 2015-08-26 | Discharge: 2015-08-26 | Disposition: A | Discharge status: Home / Self Care | Payer: Medicaid Other | Attending: Emergency Medicine | Admitting: Emergency Medicine

## 2015-08-26 ENCOUNTER — Emergency Department (HOSPITAL_COMMUNITY): Payer: Medicaid Other

## 2015-08-26 DIAGNOSIS — S4992XA Unspecified injury of left shoulder and upper arm, initial encounter: Secondary | ICD-10-CM | POA: Insufficient documentation

## 2015-08-26 DIAGNOSIS — Y9389 Activity, other specified: Secondary | ICD-10-CM | POA: Diagnosis not present

## 2015-08-26 DIAGNOSIS — J45909 Unspecified asthma, uncomplicated: Secondary | ICD-10-CM | POA: Insufficient documentation

## 2015-08-26 DIAGNOSIS — Z79899 Other long term (current) drug therapy: Secondary | ICD-10-CM | POA: Diagnosis not present

## 2015-08-26 DIAGNOSIS — S99912A Unspecified injury of left ankle, initial encounter: Secondary | ICD-10-CM | POA: Diagnosis not present

## 2015-08-26 DIAGNOSIS — Y998 Other external cause status: Secondary | ICD-10-CM | POA: Diagnosis not present

## 2015-08-26 DIAGNOSIS — Z8659 Personal history of other mental and behavioral disorders: Secondary | ICD-10-CM | POA: Diagnosis not present

## 2015-08-26 DIAGNOSIS — Z792 Long term (current) use of antibiotics: Secondary | ICD-10-CM | POA: Diagnosis not present

## 2015-08-26 DIAGNOSIS — F17210 Nicotine dependence, cigarettes, uncomplicated: Secondary | ICD-10-CM | POA: Insufficient documentation

## 2015-08-26 DIAGNOSIS — Y92410 Unspecified street and highway as the place of occurrence of the external cause: Secondary | ICD-10-CM | POA: Insufficient documentation

## 2015-08-26 DIAGNOSIS — M25572 Pain in left ankle and joints of left foot: Secondary | ICD-10-CM

## 2015-08-26 DIAGNOSIS — M25512 Pain in left shoulder: Secondary | ICD-10-CM

## 2015-08-26 MED ORDER — ACETAMINOPHEN 325 MG PO TABS
650.0000 mg | ORAL_TABLET | Freq: Once | ORAL | Status: DC
  Filled 2015-08-26: qty 2

## 2015-08-26 NOTE — Discharge Instructions (Signed)

## 2015-08-26 NOTE — ED Notes (Signed)
Declined W/C at D/C and was escorted to lobby by RN. 
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
## 2016-02-08 ENCOUNTER — Encounter (HOSPITAL_BASED_OUTPATIENT_CLINIC_OR_DEPARTMENT_OTHER): Payer: Self-pay

## 2016-02-08 ENCOUNTER — Emergency Department (HOSPITAL_BASED_OUTPATIENT_CLINIC_OR_DEPARTMENT_OTHER): Payer: Medicaid Other

## 2016-02-08 ENCOUNTER — Emergency Department (HOSPITAL_BASED_OUTPATIENT_CLINIC_OR_DEPARTMENT_OTHER)
Admission: EM | Admit: 2016-02-08 | Discharge: 2016-02-08 | Discharge status: Against Medical Advice | Payer: Medicaid Other | Attending: Emergency Medicine | Admitting: Emergency Medicine

## 2016-02-08 DIAGNOSIS — M25572 Pain in left ankle and joints of left foot: Secondary | ICD-10-CM | POA: Diagnosis not present

## 2016-02-08 DIAGNOSIS — F17210 Nicotine dependence, cigarettes, uncomplicated: Secondary | ICD-10-CM | POA: Insufficient documentation

## 2016-02-08 DIAGNOSIS — J45909 Unspecified asthma, uncomplicated: Secondary | ICD-10-CM | POA: Diagnosis not present

## 2016-02-08 DIAGNOSIS — R509 Fever, unspecified: Secondary | ICD-10-CM | POA: Insufficient documentation

## 2016-02-08 LAB — BASIC METABOLIC PANEL
Anion gap: 8 (ref 5–15)
BUN: 14 mg/dL (ref 6–20)
CHLORIDE: 103 mmol/L (ref 101–111)
CO2: 26 mmol/L (ref 22–32)
Calcium: 8.9 mg/dL (ref 8.9–10.3)
Creatinine, Ser: 0.96 mg/dL (ref 0.61–1.24)
GFR calc Af Amer: >60 mL/min (ref >60)
GFR calc non Af Amer: >60 mL/min (ref >60)
GLUCOSE: 119 mg/dL — AB (ref 65–99)
POTASSIUM: 3.7 mmol/L (ref 3.5–5.1)
SODIUM: 137 mmol/L (ref 135–145)

## 2016-02-08 LAB — CBC WITH DIFFERENTIAL/PLATELET
Basophils Absolute: 0 10*3/uL (ref 0.0–0.1)
Basophils Relative: 0 %
Eosinophils Absolute: 0.2 10*3/uL (ref 0.0–0.7)
Eosinophils Relative: 2 %
HEMATOCRIT: 43.6 % (ref 39.0–52.0)
HEMOGLOBIN: 14.9 g/dL (ref 13.0–17.0)
LYMPHS ABS: 1.8 10*3/uL (ref 0.7–4.0)
LYMPHS PCT: 16 %
MCH: 31.5 pg (ref 26.0–34.0)
MCHC: 34.2 g/dL (ref 30.0–36.0)
MCV: 92.2 fL (ref 78.0–100.0)
MONOS PCT: 10 %
Monocytes Absolute: 1.1 10*3/uL — ABNORMAL HIGH (ref 0.1–1.0)
NEUTROS PCT: 72 %
Neutro Abs: 8.3 10*3/uL — ABNORMAL HIGH (ref 1.7–7.7)
Platelets: 287 10*3/uL (ref 150–400)
RBC: 4.73 MIL/uL (ref 4.22–5.81)
RDW: 12.4 % (ref 11.5–15.5)
WBC: 11.4 10*3/uL — AB (ref 4.0–10.5)

## 2016-02-08 LAB — C-REACTIVE PROTEIN: CRP: 3.2 mg/dL — ABNORMAL HIGH (ref <1.0)

## 2016-02-08 LAB — SEDIMENTATION RATE: Sed Rate: 2 mm/hr (ref 0–16)

## 2016-02-08 MED ORDER — OXYCODONE-ACETAMINOPHEN 5-325 MG PO TABS
1.0000 | ORAL_TABLET | Freq: Once | ORAL | Status: AC
  Administered 2016-02-08: 1 via ORAL
  Filled 2016-02-08: qty 1

## 2016-02-08 MED ORDER — SODIUM CHLORIDE 0.9 % IV BOLUS (SEPSIS)
1000.0000 mL | Freq: Once | INTRAVENOUS | Status: AC
  Administered 2016-02-08: 1000 mL via INTRAVENOUS

## 2016-02-08 NOTE — ED Notes (Signed)
Was informed that pt had left room 2, per nurse 1st and registation pt was ask if he had been dc 'd mumbled something but did not stop,  I went to parking lot w [employer]/management officerpolice officer and nurse 1st  but unable to spot pt,  In room iv tubing had been disconnected from iv,  Roller clamp open and fluid running onto floor,  Unable to locate iv catheter or 6 inch extension tubing in room or in [employer]  Charge Rn notified
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
## 2016-02-08 NOTE — ED Notes (Signed)
I called pts home number that was disconnected and no answer from his cell number

## 2016-02-08 NOTE — ED Notes (Signed)
Pain to left anke/foot x 3-4 days-denies injury-hx of multiple surgeries-last was 2016-to triage in w/c
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
## 2016-02-08 NOTE — ED Notes (Signed)
Pt walked at fast paced gait through ED lobby with adult male following-did not stop when spoken to by staff

## 2016-02-09 ENCOUNTER — Emergency Department (HOSPITAL_COMMUNITY): Payer: Medicaid Other

## 2016-02-09 ENCOUNTER — Encounter (HOSPITAL_COMMUNITY): Payer: Self-pay | Admitting: Emergency Medicine

## 2016-02-09 ENCOUNTER — Inpatient Hospital Stay (HOSPITAL_COMMUNITY)
Admission: EM | Admit: 2016-02-09 | Discharge: 2016-02-14 | DRG: 603 | Disposition: A | Discharge status: Home / Self Care | Payer: Medicaid Other | Attending: Internal Medicine | Admitting: Internal Medicine

## 2016-02-09 DIAGNOSIS — F17200 Nicotine dependence, unspecified, uncomplicated: Secondary | ICD-10-CM

## 2016-02-09 DIAGNOSIS — L03116 Cellulitis of left lower limb: Principal | ICD-10-CM | POA: Insufficient documentation

## 2016-02-09 DIAGNOSIS — F1410 Cocaine abuse, uncomplicated: Secondary | ICD-10-CM

## 2016-02-09 DIAGNOSIS — F419 Anxiety disorder, unspecified: Secondary | ICD-10-CM | POA: Diagnosis present

## 2016-02-09 DIAGNOSIS — B9561 Methicillin susceptible Staphylococcus aureus infection as the cause of diseases classified elsewhere: Secondary | ICD-10-CM | POA: Diagnosis present

## 2016-02-09 DIAGNOSIS — R7881 Bacteremia: Secondary | ICD-10-CM

## 2016-02-09 DIAGNOSIS — F1120 Opioid dependence, uncomplicated: Secondary | ICD-10-CM

## 2016-02-09 DIAGNOSIS — F17210 Nicotine dependence, cigarettes, uncomplicated: Secondary | ICD-10-CM | POA: Diagnosis present

## 2016-02-09 DIAGNOSIS — M009 Pyogenic arthritis, unspecified: Secondary | ICD-10-CM | POA: Diagnosis present

## 2016-02-09 DIAGNOSIS — Z79899 Other long term (current) drug therapy: Secondary | ICD-10-CM

## 2016-02-09 DIAGNOSIS — J45909 Unspecified asthma, uncomplicated: Secondary | ICD-10-CM | POA: Diagnosis present

## 2016-02-09 DIAGNOSIS — G8929 Other chronic pain: Secondary | ICD-10-CM | POA: Diagnosis present

## 2016-02-09 DIAGNOSIS — M25579 Pain in unspecified ankle and joints of unspecified foot: Secondary | ICD-10-CM

## 2016-02-09 DIAGNOSIS — M25473 Effusion, unspecified ankle: Secondary | ICD-10-CM | POA: Insufficient documentation

## 2016-02-09 LAB — CBC WITH DIFFERENTIAL/PLATELET
Basophils Absolute: 0 10*3/uL (ref 0.0–0.1)
Basophils Relative: 0 %
Eosinophils Absolute: 0.2 10*3/uL (ref 0.0–0.7)
Eosinophils Relative: 2 %
HCT: 44.9 % (ref 39.0–52.0)
Hemoglobin: 15 g/dL (ref 13.0–17.0)
Lymphocytes Relative: 17 %
Lymphs Abs: 2.2 10*3/uL (ref 0.7–4.0)
MCH: 30.7 pg (ref 26.0–34.0)
MCHC: 33.4 g/dL (ref 30.0–36.0)
MCV: 92 fL (ref 78.0–100.0)
Monocytes Absolute: 1.5 10*3/uL — ABNORMAL HIGH (ref 0.1–1.0)
Monocytes Relative: 11 %
Neutro Abs: 8.9 10*3/uL — ABNORMAL HIGH (ref 1.7–7.7)
Neutrophils Relative %: 70 %
Platelets: 289 10*3/uL (ref 150–400)
RBC: 4.88 MIL/uL (ref 4.22–5.81)
RDW: 12.5 % (ref 11.5–15.5)
WBC: 12.8 10*3/uL — ABNORMAL HIGH (ref 4.0–10.5)

## 2016-02-09 LAB — I-STAT BETA HCG BLOOD, ED (MC, WL, AP ONLY): I-stat hCG, quantitative: <5 m[IU]/mL (ref <5)

## 2016-02-09 LAB — BASIC METABOLIC PANEL
Anion gap: 8 (ref 5–15)
BUN: 8 mg/dL (ref 6–20)
CO2: 25 mmol/L (ref 22–32)
Calcium: 9 mg/dL (ref 8.9–10.3)
Chloride: 104 mmol/L (ref 101–111)
Creatinine, Ser: 0.95 mg/dL (ref 0.61–1.24)
GFR calc Af Amer: >60 mL/min (ref >60)
GFR calc non Af Amer: >60 mL/min (ref >60)
Glucose, Bld: 90 mg/dL (ref 65–99)
Potassium: 4 mmol/L (ref 3.5–5.1)
Sodium: 137 mmol/L (ref 135–145)

## 2016-02-09 LAB — I-STAT CG4 LACTIC ACID, ED: Lactic Acid, Venous: 0.87 mmol/L (ref 0.5–2.0)

## 2016-02-09 MED ORDER — HYDROMORPHONE HCL 1 MG/ML IJ SOLN
2.0000 mg | Freq: Once | INTRAMUSCULAR | Status: AC
  Administered 2016-02-09: 2 mg via INTRAVENOUS
  Filled 2016-02-09: qty 2

## 2016-02-09 MED ORDER — VANCOMYCIN HCL IN DEXTROSE 1-5 GM/200ML-% IV SOLN: 1000.0000 mg | Freq: Once | INTRAVENOUS | Status: DC

## 2016-02-09 MED ORDER — VANCOMYCIN HCL 10 G IV SOLR
1500.0000 mg | Freq: Once | INTRAVENOUS | Status: AC
  Administered 2016-02-10: 1500 mg via INTRAVENOUS
  Filled 2016-02-09 (×2): qty 1500

## 2016-02-09 MED ORDER — GADOBENATE DIMEGLUMINE 529 MG/ML IV SOLN
19.0000 mL | Freq: Once | INTRAVENOUS | Status: AC | PRN
  Administered 2016-02-09: 19 mL via INTRAVENOUS

## 2016-02-09 MED ORDER — FENTANYL CITRATE (PF) 100 MCG/2ML IJ SOLN
50.0000 ug | Freq: Once | INTRAMUSCULAR | Status: AC
  Administered 2016-02-09: 50 ug via INTRAVENOUS
  Filled 2016-02-09: qty 2

## 2016-02-09 MED ORDER — FENTANYL CITRATE (PF) 100 MCG/2ML IJ SOLN
50.0000 ug | Freq: Once | INTRAMUSCULAR | Status: DC
  Filled 2016-02-09: qty 2

## 2016-02-09 MED ORDER — NICOTINE 14 MG/24HR TD PT24
14.0000 mg | MEDICATED_PATCH | Freq: Once | TRANSDERMAL | Status: DC
  Administered 2016-02-09: 14 mg via TRANSDERMAL
  Filled 2016-02-09: qty 1

## 2016-02-09 MED ORDER — PIPERACILLIN-TAZOBACTAM 3.375 G IVPB 30 MIN
3.3750 g | Freq: Once | INTRAVENOUS | Status: AC
  Administered 2016-02-10: 3.375 g via INTRAVENOUS
  Filled 2016-02-09: qty 50

## 2016-02-09 NOTE — ED Notes (Signed)
Pt. Stated, I had surgery on my foot was 8-9 months ago  And 2 days ago it startd hurting so bad I couldn't stand it.  [facility] and they said I had an infetion. I had some IV antibiotics and I was a fool and jerked it out ad left.

## 2016-02-09 NOTE — ED Notes (Addendum)
Patient states his iv is hurting, iv is still patent will continue to monitor to possibly change iv site

## 2016-02-09 NOTE — ED Notes (Signed)
Patient in mri, mri contacted this rn to request more pain medication, the patient states he cannot take the pain, er physician [facility]

## 2016-02-09 NOTE — ED Notes (Signed)
Pt request that this rn try iv access again because he does not want to wait any longer for pain medication, iv access obtained and iv team consult order dc'd

## 2016-02-09 NOTE — ED Notes (Addendum)
Patient is back from mri, and his pain has returned, er physician at bedside

## 2016-02-09 NOTE — ED Notes (Signed)
Patient brought back from mri, mri not completed due to the patient stated he needed to take a break and would come back when his pain was under control, this rn took the patient fentanyl and he stated that was not working well and he would like something else, er physician notified and fentanyl order changed to dilaudid iv, fentanyl wasted with brandon white rn, mri contacted that they could come back to get the patient

## 2016-02-09 NOTE — ED Notes (Addendum)
Pt difficult stick, iv team consulted, patient instructed once he had iv access i could give him pain medication

## 2016-02-09 NOTE — ED Provider Notes (Signed)
CSN: [id number]     Arrival date & time 02/09/16  1230 History   First MD Initiated Contact with Patient 02/09/16 1248     Chief Complaint  Patient presents with  . Foot Pain     (Consider location/radiation/quality/duration/timing/severity/associated sxs/prior Treatment) HPI Kenneth Charles is a 35 y.o. male with history of heroin abuse, asthma, anxiety, presents to emergency department complaining of left ankle pain. Patient had surgical repair [facility] on [facility]. Since then he has had chronic pain in the left ankle. He states 2 days ago ankle swelled up more than usual and is a lot more painful than in the past. He reports low-grade fever. He reports any palpation or walking on the ankle makes pain worse. Nothing makes it better. He was seen in at [facility] last night, where infection in his ankle was suspected based on the x-ray, blood work and examination. CT scan of the ankle was ordered but patient eloped with his IV intact. He is back today because the pain is not improving.   Past Medical History  Diagnosis Date  . Asthma   . Anxiety   . Heroin addiction [facility](HCC)    Past Surgical History  Procedure Laterality Date  . Orif calcaneal fracture Left 10/05/2014  . Ankle surgery     Family History  Problem Relation Age of Onset  . Hypertension Father   . Hypertension Mother    Social History  Substance Use Topics  . Smoking status: Current Every Day Smoker -- 0.50 packs/day    Types: Cigarettes  . Smokeless tobacco: Never Used  . Alcohol Use: No    Review of Systems  Constitutional: Positive for fever and chills.  Respiratory: Negative for cough, chest tightness and shortness of breath.   Cardiovascular: Negative for chest pain, palpitations and leg swelling.  Gastrointestinal: Negative for nausea, vomiting, abdominal pain, diarrhea and abdominal distention.  Genitourinary: Negative for dysuria, urgency, frequency and hematuria.   Musculoskeletal: Positive for joint swelling. Negative for myalgias, arthralgias, neck pain and neck stiffness.  Skin: Negative for rash.  Allergic/Immunologic: Negative for immunocompromised state.  Neurological: Negative for dizziness, weakness, light-headedness, numbness and headaches.  All other systems reviewed and are negative.     Allergies  Review of patient's allergies indicates no known allergies.  Home Medications   Prior to Admission medications   Medication Sig Start Date End Date Taking? Authorizing Provider  albuterol (PROVENTIL HFA;VENTOLIN HFA) 108 (90 Base) MCG/ACT inhaler Inhale 2 puffs into the lungs every 6 (six) hours as needed for wheezing or shortness of breath.   Yes Historical Provider, MD  Oxycodone HCl 10 MG TABS Take 10 mg by mouth 3 (three) times daily.    Yes Historical Provider, MD   BP 151/81 mmHg  Pulse 98  Temp(Src) 98.2 F (36.8 C) (Oral)  Resp 18  Ht 6\' 3"  (1.905 m)  Wt 86.183 kg  BMI 23.75 kg/m2  SpO2 98% Physical Exam  Constitutional: He appears well-developed and well-nourished. No distress.  HENT:  Head: Normocephalic and atraumatic.  Eyes: Conjunctivae are normal.  Neck: Neck supple.  Cardiovascular: Normal rate, regular rhythm and normal heart sounds.   Pulmonary/Chest: Effort normal. No respiratory distress. He has no wheezes. He has no rales.  Musculoskeletal: He exhibits no edema.  Swelling noted over left ankle. Diffuse tenderness to palpation with most tenderness over the Achilles, just posterior to medial and lateral malleolus, Calcaneus. Pain with any range of motion. Dorsal pedal pulse  intact.  Neurological: He is alert.  Skin: Skin is warm and dry.  Nursing note and vitals reviewed.   ED Course  Procedures (including critical care time) Labs Review Labs Reviewed - No data to display  Imaging Review Dg Ankle Complete Left  02/08/2016  CLINICAL DATA:  Left ankle pain, previous surgery 1 year ago, pt with ankle  swelling and posterior ankle pain and discomfort x 2 days of being unable to bear weight EXAM: LEFT ANKLE COMPLETE - 3+ VIEW COMPARISON:  Of 01/19/2015 FINDINGS: Orthopedic fixation calcaneus. Two of the screws are fractured as previously seen. Increased soft tissue opacity posterior to the ankle joint similar to prior but mildly increased. IMPRESSION: ORIF calcaneus with features unchanged from prior studies the above discussion. Mildly increased soft tissue attenuation posterior to ankle joint. Electronically Signed   By: Esperanza Heir M.D.   On: 02/08/2016 17:26   I have personally reviewed and evaluated these images and lab results as part of my medical decision-making.   EKG Interpretation None      MDM   Final diagnoses:  Ankle pain  Ankle swelling   Patient with increased swelling and pain to the left ankle. Prior surgical management of calcaneal fracture 60 month ago. Was seen yesterday. Mildly elevated white blood cell count, low-grade temperature of 100.4. Question possible septic joint versus osteomyelitis. Will get MRI of the ankle.  Patient apparently refused MRI due to pain. Will treat his fentanyl.  MRI had to bring patient back halfway through the study due to patient's pain.  Pt signed out to PA hedges pending MRI  Filed Vitals:   02/10/16 0000 02/10/16 0015 02/10/16 0200 02/10/16 0433  BP: 132/72 156/104 141/83 131/74  Pulse: 100 110 105 102  Temp:   99.2 F (37.3 C) 100.7 F (38.2 C)  TempSrc:   Oral Oral  Resp:   20 18  Height:      Weight:      SpO2: 92% 96% 97% 96%     Jaynie Crumble, PA-C 02/10/16 0728  Raeford Razor, MD 02/22/16 2144

## 2016-02-09 NOTE — ED Provider Notes (Signed)
35 year old male signed to me at shift change pending MRI. Patient has a significant past medical history of heroin abuse, asthma, anxiety and presented to the emergency room with left ankle pain. Patient had undergone surgical repair of the calcaneus area open reduction and internal fixation. This was performed on 10/05/2014 by Dr. Ihor GullyBiggerstaff at [facility]. Since that time patient has had chronic pain in the ankle. He reports that 2 days ago he started having swelling, warmth to touch, more painful than usual. He reports a low grade fever at home. Patient was seen last night at [facility], and left with the IV still in his arm before further management could be taken.  Patient's labs returned with slightly elevated white count compared to yesterday. MRI study with only preliminary read showing edema, small joint effusion, no obvious signs of osteomyelitis.  Patient's pain managed here in the ED, admitted to the hospitalist service for further evaluation and management. Patient received IV antibiotics here.  Eyvonne MechanicJeffrey Lenn Volker, PA-C 02/10/16 [id number]  Loren Raceravid Yelverton, MD 02/10/16 1340

## 2016-02-09 NOTE — ED Provider Notes (Signed)
CSN: [id number]     Arrival date & time 02/08/16  1542 History   First MD Initiated Contact with Patient 02/08/16 1723     Chief Complaint  Patient presents with  . Ankle Pain     (Consider location/radiation/quality/duration/timing/severity/associated sxs/prior Treatment) HPI Comments: Patient is a 35 year old male who presents with left ankle pain. Patient had an ORIF of his calcaneus one year ago. Patient had made a good recovery and had been doing well. Patient does however take oxycodone 3 times a day for his pain. Patient began having pain worsening and unable to bear weight for days ago. Patient denies any new injury. Patient does not know if he had fevers at home. Patient denies any chest pain, shortness of breath, abdominal pain, nausea, vomiting, dysuria.  Patient is a 35 y.o. male presenting with ankle pain. The history is provided by the patient.  Ankle Pain Associated symptoms: fever   Associated symptoms: no back pain     Past Medical History  Diagnosis Date  . Asthma   . Anxiety   . Heroin addiction [facility](HCC)    Past Surgical History  Procedure Laterality Date  . Orif calcaneal fracture Left 10/05/2014  . Ankle surgery     Family History  Problem Relation Age of Onset  . Hypertension Father   . Hypertension Mother    Social History  Substance Use Topics  . Smoking status: Current Every Day Smoker -- 0.50 packs/day    Types: Cigarettes  . Smokeless tobacco: Never Used  . Alcohol Use: No    Review of Systems  Constitutional: Positive for fever. Negative for chills.  HENT: Negative for facial swelling and sore throat.   Respiratory: Negative for shortness of breath.   Cardiovascular: Negative for chest pain.  Gastrointestinal: Negative for nausea, vomiting and abdominal pain.  Genitourinary: Negative for dysuria.  Musculoskeletal: Positive for joint swelling and arthralgias. Negative for back pain.  Skin: Negative for rash and wound.  Neurological: Negative  for headaches.  Psychiatric/Behavioral: The patient is not nervous/anxious.       Allergies  Review of patient's allergies indicates no known allergies.  Home Medications   Prior to Admission medications   Medication Sig Start Date End Date Taking? Authorizing Provider  albuterol (PROVENTIL HFA;VENTOLIN HFA) 108 (90 BASE) MCG/ACT inhaler Inhale 2 puffs into the lungs every 2 (two) hours as needed for wheezing or shortness of breath (cough). Patient not taking: Reported on 05/01/2015 03/12/15   Mercedes Camprubi-Soms, PA-C  Oxycodone HCl 10 MG TABS Take 10 mg by mouth 3 (three) times daily.     Historical Provider, MD   BP 128/84 mmHg  Pulse 118  Temp(Src) 100.4 F (38 C) (Oral)  Resp 18  Ht 6\' 3"  (1.905 m)  Wt 88.451 kg  BMI 24.37 kg/m2  SpO2 98% Physical Exam  Constitutional: He appears well-developed and well-nourished. No distress.  HENT:  Head: Normocephalic and atraumatic.  Mouth/Throat: Oropharynx is clear and moist. No oropharyngeal exudate.  Eyes: Conjunctivae are normal. Pupils are equal, round, and reactive to light. Right eye exhibits no discharge. Left eye exhibits no discharge. No scleral icterus.  Neck: Normal range of motion. Neck supple. No thyromegaly present.  Cardiovascular: Normal rate, regular rhythm and normal heart sounds.  Exam reveals no gallop and no friction rub.   No murmur heard. Pulmonary/Chest: Effort normal and breath sounds normal. No stridor. No respiratory distress. He has no wheezes. He has no rales.  Abdominal: Soft. Bowel sounds are normal. He  exhibits no distension. There is no tenderness. There is no rebound and no guarding.  Musculoskeletal: He exhibits no edema.       Left ankle: He exhibits decreased range of motion, swelling and ecchymosis. He exhibits no laceration and normal pulse. Tenderness. Lateral malleolus tenderness found. Achilles tendon exhibits pain. Achilles tendon exhibits no defect.  Pain in achilles with Thompson test,  Achilles intact; extreme pain and tenderness with minimal movement or palpation of ankle and foot, worst pain localized to calcaneus; well-healed incision to lateral ankle; normal sensation, cap refill <2secs  Lymphadenopathy:    He has no cervical adenopathy.  Neurological: He is alert. Coordination normal.  Skin: Skin is warm and dry. No rash noted. He is not diaphoretic. No pallor.  Psychiatric: He has a normal mood and affect.  Nursing note and vitals reviewed.   ED Course  Procedures (including critical care time) Labs Review Labs Reviewed  CBC WITH DIFFERENTIAL/PLATELET - Abnormal; Notable for the following:    WBC 11.4 (*)    Neutro Abs 8.3 (*)    Monocytes Absolute 1.1 (*)    All other components within normal limits  BASIC METABOLIC PANEL - Abnormal; Notable for the following:    Glucose, Bld 119 (*)    All other components within normal limits  C-REACTIVE PROTEIN - Abnormal; Notable for the following:    CRP 3.2 (*)    All other components within normal limits  CULTURE, BLOOD (ROUTINE X 2)  CULTURE, BLOOD (ROUTINE X 2)  SEDIMENTATION RATE    Imaging Review Dg Ankle Complete Left  02/08/2016  CLINICAL DATA:  Left ankle pain, previous surgery 1 year ago, pt with ankle swelling and posterior ankle pain and discomfort x 2 days of being unable to bear weight EXAM: LEFT ANKLE COMPLETE - 3+ VIEW COMPARISON:  Of 01/19/2015 FINDINGS: Orthopedic fixation calcaneus. Two of the screws are fractured as previously seen. Increased soft tissue opacity posterior to the ankle joint similar to prior but mildly increased. IMPRESSION: ORIF calcaneus with features unchanged from prior studies the above discussion. Mildly increased soft tissue attenuation posterior to ankle joint. Electronically Signed   By: Esperanza Heir M.D.   On: 02/08/2016 17:26   I have personally reviewed and evaluated these images and lab results as part of my medical decision-making.   EKG Interpretation None       MDM    Patient left AGAINST MEDICAL ADVICE and walked out with his IV in his arm. Patient was questioned at registration and did not stop. Patient was seen quickly walking through the ED lobby to his car. Patient told nursing staff that he had not used heroin 6 months. When the sheriff was called to report this case, the patient was well known to the sheriff and that he actually has been currently using. Concern for septic joint. Plan was to aspirate joint. CBC shows WBC 11.4. BMP unremarkable. Sedimentation rate 2, CRP 3.2. X-ray shows ORIF calcaneus with features unchanged from prior studies; mildly increased soft tissue attenuation posterior ankle joint.  Final diagnoses:  Ankle pain, left        Emi Holes, PA-C 02/09/16 0206  Leta Baptist, MD 02/11/16 2328

## 2016-02-09 NOTE — ED Notes (Signed)
Patient request nicotine patch

## 2016-02-09 NOTE — ED Notes (Signed)
Patient states that his pain has returned, er physician notified

## 2016-02-09 NOTE — ED Notes (Addendum)
Patient states that his IV is starting to hurt again and that his pain is coming back in his foot, er physician notified, patient states that he would like to see the charge nurse about his pain control, Onalee Huaavid RN notified

## 2016-02-10 DIAGNOSIS — F17210 Nicotine dependence, cigarettes, uncomplicated: Secondary | ICD-10-CM | POA: Diagnosis present

## 2016-02-10 DIAGNOSIS — M25579 Pain in unspecified ankle and joints of unspecified foot: Secondary | ICD-10-CM | POA: Diagnosis present

## 2016-02-10 DIAGNOSIS — B9561 Methicillin susceptible Staphylococcus aureus infection as the cause of diseases classified elsewhere: Secondary | ICD-10-CM

## 2016-02-10 DIAGNOSIS — M00072 Staphylococcal arthritis, left ankle and foot: Secondary | ICD-10-CM

## 2016-02-10 DIAGNOSIS — F1410 Cocaine abuse, uncomplicated: Secondary | ICD-10-CM

## 2016-02-10 DIAGNOSIS — L03116 Cellulitis of left lower limb: Secondary | ICD-10-CM | POA: Diagnosis present

## 2016-02-10 DIAGNOSIS — A499 Bacterial infection, unspecified: Secondary | ICD-10-CM

## 2016-02-10 DIAGNOSIS — M009 Pyogenic arthritis, unspecified: Secondary | ICD-10-CM

## 2016-02-10 DIAGNOSIS — F419 Anxiety disorder, unspecified: Secondary | ICD-10-CM | POA: Diagnosis present

## 2016-02-10 DIAGNOSIS — J45909 Unspecified asthma, uncomplicated: Secondary | ICD-10-CM | POA: Diagnosis present

## 2016-02-10 DIAGNOSIS — M01X79 Direct infection of unspecified ankle and foot in infectious and parasitic diseases classified elsewhere: Secondary | ICD-10-CM

## 2016-02-10 DIAGNOSIS — F17200 Nicotine dependence, unspecified, uncomplicated: Secondary | ICD-10-CM

## 2016-02-10 DIAGNOSIS — F1120 Opioid dependence, uncomplicated: Secondary | ICD-10-CM | POA: Diagnosis present

## 2016-02-10 DIAGNOSIS — R7881 Bacteremia: Secondary | ICD-10-CM | POA: Diagnosis present

## 2016-02-10 DIAGNOSIS — G8929 Other chronic pain: Secondary | ICD-10-CM | POA: Diagnosis present

## 2016-02-10 DIAGNOSIS — Z79899 Other long term (current) drug therapy: Secondary | ICD-10-CM | POA: Diagnosis not present

## 2016-02-10 LAB — BASIC METABOLIC PANEL
Anion gap: 10 (ref 5–15)
BUN: 10 mg/dL (ref 6–20)
CALCIUM: 8.9 mg/dL (ref 8.9–10.3)
CO2: 22 mmol/L (ref 22–32)
CREATININE: 0.89 mg/dL (ref 0.61–1.24)
Chloride: 100 mmol/L — ABNORMAL LOW (ref 101–111)
GFR calc non Af Amer: >60 mL/min (ref >60)
GLUCOSE: 109 mg/dL — AB (ref 65–99)
Potassium: 4.4 mmol/L (ref 3.5–5.1)
Sodium: 132 mmol/L — ABNORMAL LOW (ref 135–145)

## 2016-02-10 LAB — BLOOD CULTURE ID PANEL (REFLEXED)
Acinetobacter baumannii: NOT DETECTED
CANDIDA ALBICANS: NOT DETECTED
Candida glabrata: NOT DETECTED
Candida krusei: NOT DETECTED
Candida parapsilosis: NOT DETECTED
Candida tropicalis: NOT DETECTED
Carbapenem resistance: NOT DETECTED
ENTEROBACTER CLOACAE COMPLEX: NOT DETECTED
ENTEROBACTERIACEAE SPECIES: NOT DETECTED
ENTEROCOCCUS SPECIES: NOT DETECTED
Escherichia coli: NOT DETECTED
HAEMOPHILUS INFLUENZAE: NOT DETECTED
Klebsiella oxytoca: NOT DETECTED
Klebsiella pneumoniae: NOT DETECTED
Listeria monocytogenes: NOT DETECTED
METHICILLIN RESISTANCE: NOT DETECTED
NEISSERIA MENINGITIDIS: NOT DETECTED
PSEUDOMONAS AERUGINOSA: NOT DETECTED
Proteus species: NOT DETECTED
STAPHYLOCOCCUS AUREUS BCID: DETECTED — AB
STREPTOCOCCUS AGALACTIAE: NOT DETECTED
STREPTOCOCCUS PYOGENES: NOT DETECTED
Serratia marcescens: NOT DETECTED
Staphylococcus species: DETECTED — AB
Streptococcus pneumoniae: NOT DETECTED
Streptococcus species: NOT DETECTED
VANCOMYCIN RESISTANCE: NOT DETECTED

## 2016-02-10 LAB — RAPID URINE DRUG SCREEN, HOSP PERFORMED
Amphetamines: NOT DETECTED
Barbiturates: NOT DETECTED
Benzodiazepines: NOT DETECTED
Cocaine: NOT DETECTED
OPIATES: POSITIVE — AB
Tetrahydrocannabinol: POSITIVE — AB

## 2016-02-10 LAB — CBC
HCT: 46.5 % (ref 39.0–52.0)
Hemoglobin: 15.6 g/dL (ref 13.0–17.0)
MCH: 30.5 pg (ref 26.0–34.0)
MCHC: 33.5 g/dL (ref 30.0–36.0)
MCV: 91 fL (ref 78.0–100.0)
PLATELETS: 232 10*3/uL (ref 150–400)
RBC: 5.11 MIL/uL (ref 4.22–5.81)
RDW: 12.3 % (ref 11.5–15.5)
WBC: 15.1 10*3/uL — ABNORMAL HIGH (ref 4.0–10.5)

## 2016-02-10 LAB — C-REACTIVE PROTEIN: CRP: 8.2 mg/dL — ABNORMAL HIGH (ref <1.0)

## 2016-02-10 LAB — SEDIMENTATION RATE: Sed Rate: 4 mm/hr (ref 0–16)

## 2016-02-10 MED ORDER — OXYCODONE HCL 5 MG PO TABS
5.0000 mg | ORAL_TABLET | ORAL | Status: DC | PRN
  Administered 2016-02-10 – 2016-02-14 (×19): 5 mg via ORAL
  Filled 2016-02-10 (×20): qty 1

## 2016-02-10 MED ORDER — CEFAZOLIN SODIUM-DEXTROSE 2-4 GM/100ML-% IV SOLN
2.0000 g | Freq: Three times a day (TID) | INTRAVENOUS | Status: DC
  Administered 2016-02-10 – 2016-02-12 (×7): 2 g via INTRAVENOUS
  Filled 2016-02-10 (×11): qty 100

## 2016-02-10 MED ORDER — ACETAMINOPHEN 650 MG RE SUPP: 650.0000 mg | Freq: Four times a day (QID) | RECTAL | Status: DC | PRN

## 2016-02-10 MED ORDER — PIPERACILLIN-TAZOBACTAM 3.375 G IVPB
3.3750 g | Freq: Three times a day (TID) | INTRAVENOUS | Status: DC
  Filled 2016-02-10 (×2): qty 50

## 2016-02-10 MED ORDER — ALBUTEROL SULFATE (2.5 MG/3ML) 0.083% IN NEBU: 2.5000 mg | INHALATION_SOLUTION | Freq: Four times a day (QID) | RESPIRATORY_TRACT | Status: DC | PRN

## 2016-02-10 MED ORDER — PIPERACILLIN-TAZOBACTAM 3.375 G IVPB
3.3750 g | Freq: Three times a day (TID) | INTRAVENOUS | Status: DC
  Administered 2016-02-10: 3.375 g via INTRAVENOUS
  Filled 2016-02-10 (×2): qty 50

## 2016-02-10 MED ORDER — RIFAMPIN 300 MG PO CAPS
300.0000 mg | ORAL_CAPSULE | Freq: Two times a day (BID) | ORAL | Status: DC
  Administered 2016-02-10 – 2016-02-14 (×9): 300 mg via ORAL
  Filled 2016-02-10 (×10): qty 1

## 2016-02-10 MED ORDER — HYDROMORPHONE HCL 1 MG/ML IJ SOLN
1.0000 mg | INTRAMUSCULAR | Status: DC | PRN
  Administered 2016-02-10 (×4): 1 mg via INTRAVENOUS
  Filled 2016-02-10 (×3): qty 1

## 2016-02-10 MED ORDER — HYDROMORPHONE HCL 1 MG/ML IJ SOLN
2.0000 mg | Freq: Once | INTRAMUSCULAR | Status: AC
  Administered 2016-02-10: 2 mg via INTRAVENOUS
  Filled 2016-02-10: qty 2

## 2016-02-10 MED ORDER — HYDROMORPHONE HCL 1 MG/ML IJ SOLN
0.5000 mg | Freq: Once | INTRAMUSCULAR | Status: AC
  Administered 2016-02-10: 0.5 mg via INTRAVENOUS
  Filled 2016-02-10: qty 1

## 2016-02-10 MED ORDER — OXYCODONE HCL 5 MG PO TABS
5.0000 mg | ORAL_TABLET | ORAL | Status: DC | PRN
  Administered 2016-02-10 (×5): 5 mg via ORAL
  Filled 2016-02-10 (×5): qty 1

## 2016-02-10 MED ORDER — HYDROMORPHONE HCL 1 MG/ML IJ SOLN: 1.0000 mg | INTRAMUSCULAR | Status: DC | PRN

## 2016-02-10 MED ORDER — NICOTINE 21 MG/24HR TD PT24
21.0000 mg | MEDICATED_PATCH | Freq: Every day | TRANSDERMAL | Status: DC
  Administered 2016-02-10 – 2016-02-12 (×3): 21 mg via TRANSDERMAL
  Filled 2016-02-10 (×4): qty 1

## 2016-02-10 MED ORDER — HYDROMORPHONE HCL 1 MG/ML IJ SOLN
0.5000 mg | INTRAMUSCULAR | Status: DC | PRN
  Administered 2016-02-10: 1 mg via INTRAVENOUS
  Filled 2016-02-10 (×2): qty 1

## 2016-02-10 MED ORDER — ACETAMINOPHEN 325 MG PO TABS
650.0000 mg | ORAL_TABLET | Freq: Four times a day (QID) | ORAL | Status: DC | PRN
  Administered 2016-02-11: 650 mg via ORAL
  Filled 2016-02-10: qty 2

## 2016-02-10 MED ORDER — ZOLPIDEM TARTRATE 5 MG PO TABS
5.0000 mg | ORAL_TABLET | Freq: Every evening | ORAL | Status: DC | PRN
  Administered 2016-02-10 (×2): 5 mg via ORAL
  Filled 2016-02-10 (×2): qty 1

## 2016-02-10 MED ORDER — HYDROMORPHONE HCL 1 MG/ML IJ SOLN
0.5000 mg | INTRAMUSCULAR | Status: DC | PRN
  Administered 2016-02-10 (×2): 0.5 mg via INTRAVENOUS
  Filled 2016-02-10 (×2): qty 1

## 2016-02-10 MED ORDER — VANCOMYCIN HCL 10 G IV SOLR
1250.0000 mg | Freq: Three times a day (TID) | INTRAVENOUS | Status: DC
  Administered 2016-02-10: 1250 mg via INTRAVENOUS
  Filled 2016-02-10 (×2): qty 1250

## 2016-02-10 MED ORDER — IBUPROFEN 400 MG PO TABS
400.0000 mg | ORAL_TABLET | Freq: Once | ORAL | Status: AC
  Administered 2016-02-10: 400 mg via ORAL
  Filled 2016-02-10: qty 1

## 2016-02-10 MED ORDER — ONDANSETRON HCL 4 MG/2ML IJ SOLN: 4.0000 mg | Freq: Four times a day (QID) | INTRAMUSCULAR | Status: DC | PRN

## 2016-02-10 MED ORDER — HYDROMORPHONE HCL 1 MG/ML IJ SOLN
1.0000 mg | INTRAMUSCULAR | Status: DC | PRN
  Administered 2016-02-10 – 2016-02-14 (×29): 1 mg via INTRAVENOUS
  Filled 2016-02-10 (×29): qty 1

## 2016-02-10 MED ORDER — ENOXAPARIN SODIUM 40 MG/0.4ML ~~LOC~~ SOLN
40.0000 mg | Freq: Every day | SUBCUTANEOUS | Status: DC
Start: 2016-02-10 — End: 2016-02-10

## 2016-02-10 MED ORDER — ONDANSETRON HCL 4 MG PO TABS: 4.0000 mg | ORAL_TABLET | Freq: Four times a day (QID) | ORAL | Status: DC | PRN

## 2016-02-10 MED ORDER — OXYCODONE HCL 5 MG PO TABS: 5.0000 mg | ORAL_TABLET | ORAL | Status: DC | PRN

## 2016-02-10 NOTE — Progress Notes (Addendum)
Patient requesting MD come to floor tonight to look at his foot which he says is twice as swollen as it has been . He also has a fever of 101.6. Refuses Tylenol; asks for Ibuprofen. Spoke with Lenny Pastelom Callahan who is on call for [facility] and informed him of these issues. He advises me to call L. Harduk who is [facility] at [facility] to see if she can come see patient's foot.  Pt spoke directly with L. Harduk on phone at which time he said he did not need someone to look at his foot.

## 2016-02-10 NOTE — Progress Notes (Signed)
Patient complaining of pain unrelieved by 5 Oxycodone and 1mg  Dilaudid.  Wants 10mg  Oxycodone and 2mg  IV Dilaudid.  Paged on call for [facility].  Administered additional dose of Dilaudid that was ordered.

## 2016-02-10 NOTE — ED Notes (Signed)
MD bedside.  Gave permission for pt to eat, gave meal and drink.

## 2016-02-10 NOTE — Progress Notes (Signed)
MD called back and stated that he will come and assess pt later this morning. No diet or med orders given at this time. Pt remains npo. Passed message to pt

## 2016-02-10 NOTE — Progress Notes (Signed)
MD paged for diet and pain meds adjustments. Pt is currently npo

## 2016-02-10 NOTE — Consult Note (Signed)
ORTHOPAEDIC CONSULTATION  REQUESTING PHYSICIAN: Catarina Hartshornavid Tat, MD  Chief Complaint: Left heel pain  HPI: Kenneth Charles is a 35 y.o. male who presents with left heel pain. Patient states he's had pain for about 3-4 days. Patient is status post open reduction internal fixation left calcaneus in February 2016. Patient [facility] room on 6/9, 2 days ago and left AMA with his IV [facility]. Patient returns to the emergency room yesterday complaining of global pain around the left foot and ankle. Radiographs were obtained and MRI scan is obtained. Patient's white blood cell count is increased from 12.8-15.1. Maximum temperature 100.7.  Past Medical History  Diagnosis Date  . Asthma   . Anxiety   . Heroin addiction [facility](HCC)    Past Surgical History  Procedure Laterality Date  . Orif calcaneal fracture Left 10/05/2014  . Ankle surgery     Social History   Social History  . Marital Status: Single    Spouse Name: N/A  . Number of Children: N/A  . Years of Education: N/A   Social History Main Topics  . Smoking status: Current Every Day Smoker -- 0.50 packs/day    Types: Cigarettes  . Smokeless tobacco: Never Used  . Alcohol Use: No  . Drug Use: Yes    Special: Marijuana  . Sexual Activity: Not Asked   Other Topics Concern  . None   Social History Narrative   Family History  Problem Relation Age of Onset  . Hypertension Father   . Hypertension Mother    - negative except otherwise stated in the family history section No Known Allergies Prior to Admission medications   Medication Sig Start Date End Date Taking? Authorizing Provider  albuterol (PROVENTIL HFA;VENTOLIN HFA) 108 (90 Base) MCG/ACT inhaler Inhale 2 puffs into the lungs every 6 (six) hours as needed for wheezing or shortness of breath.   Yes Historical Provider, MD  Oxycodone HCl 10 MG TABS Take 10 mg by mouth 3 (three) times daily.    Yes Historical Provider, MD    Dg Ankle Complete Left  02/08/2016  CLINICAL DATA:  Left ankle pain, previous surgery 1 year ago, pt with ankle swelling and posterior ankle pain and discomfort x 2 days of being unable to bear weight EXAM: LEFT ANKLE COMPLETE - 3+ VIEW COMPARISON:  Of 01/19/2015 FINDINGS: Orthopedic fixation calcaneus. Two of the screws are fractured as previously seen. Increased soft tissue opacity posterior to the ankle joint similar to prior but mildly increased. IMPRESSION: ORIF calcaneus with features unchanged from prior studies the above discussion. Mildly increased soft tissue attenuation posterior to ankle joint. Electronically Signed   By: Esperanza Heiraymond  Rubner M.D.   On: 02/08/2016 17:26   Mr Ankle Left W Wo Contrast  02/10/2016  CLINICAL DATA:  Possible septic joint.  IV drug abuse. EXAM: MRI OF THE LEFT ANKLE WITHOUT AND WITH CONTRAST TECHNIQUE: Multiplanar, multisequence MR imaging of the ankle was performed before and after the administration of intravenous contrast. CONTRAST:  19mL MULTIHANCE GADOBENATE DIMEGLUMINE 529 MG/ML IV SOLN COMPARISON:  02/08/2016 FINDINGS: A multitude of series were obtained because the patient kept leaving against medical advice multiple times, and ended up being scanned on multiple scan nurse. Calcaneal plate and screw fixator device significantly reduces diagnostic sensitivity and specificity due to extensive metal artifact. TENDONS Peroneal: Not visible along much of their course, but intact were visible. Posteromedial: Mild tenosynovitis of the flexor hallucis longus at the level  of the knot of Henry. Anterior: Unremarkable Achilles: A unremarkable, partially obscured distally. Plantar Fascia: Unremarkable LIGAMENTS Lateral: The inferior tibiofibular ligaments appear intact. Abnormally attenuated anterior talofibular ligament with surrounding inflammatory findings, images 14-16 series 6, likely from a prior tear, anterolateral impingement not excluded. Calcaneofibular ligament not  visible, completely obscured. Medial: Thickened superomedial portion of the spring ligament. Inferior components of the spring ligament are thought to be intact. Deltoid ligament seems intact. CARTILAGE Ankle Joint: No significant osteochondral lesion observed. Subtalar Joints/Sinus Tarsi: There is low-level edema in the talus along the posterior subtalar joint with degenerative findings such as a small degenerative subcortical cyst on image 11/4. Low-level edema in the adjacent sinus tarsi, although less than is typically seen in sinus tarsi syndrome. Part of the talus is obscured by metal artifact in the calcaneus. Bones: There is thought to be loss of height of the calcaneus related to the prior fracture, although the calcaneus is essentially completely obscured by the metal artifact. No findings of septic joint. Post-contrast images are severely degraded by motion and field heterogeneity, and are not considered reliable for assessment of enhancement. IMPRESSION: IMPRESSION 1. No compelling findings of septic joint. Trace tibiotalar joint effusion but without compelling synovitis. 2. Edema in the talus is felt to be highly likely to be due to degenerative subtalar arthropathy rather than osteomyelitis. 3. Severely degraded images by both motion artifact and metal artifact from the calcaneal hardware. 4. Torn anterior talofibular ligament with local inflammatory findings reason the possibility of anterolateral impingement. 5. Ancillary relatively minor findings including mild tenosynovitis of the flexor hallucis longus and thickening of the superomedial portion of the spring ligament. These discussions are primarily in accord with the preliminary report issued by Dr. Cherly Hensen on 02/09/2016. The only potentially significant additional finding is the apparent tear of the ATFL and possibility of anterolateral impingement. Electronically Signed   By: Gaylyn Rong M.D.   On: 02/10/2016 11:52   - pertinent xrays,  CT, MRI studies were reviewed and independently interpreted  Positive ROS: All other systems have been reviewed and were otherwise negative with the exception of those mentioned in the HPI and as above.  Physical Exam: General: Alert, no acute distress Cardiovascular: No pedal edema Respiratory: No cyanosis, no use of accessory musculature GI: No organomegaly, abdomen is soft and non-tender Skin: No lesions in the area of chief complaint, the lateral extensile incision is well-healed. Neurologic: Sensation intact distally Psychiatric: Patient is competent for consent with normal mood and affect Lymphatic: No axillary or cervical lymphadenopathy  MUSCULOSKELETAL:  Patient has a strong dorsalis pedis pulse. Patient complains of severe pain when palpating a dorsal pulse. Palpation anywhere around the foot and ankle patient complains of excruciating pain. There is no redness no cellulitis no open wounds. Radiographs shows no destructive bony changes. I reviewed the MRI scan images and there is no signs of abscess no signs of osteomyelitis.  Assessment: Assessment: Possible soft tissue infection left heel.  Plan: Plan: Patient was adamant that he was not going to leave the hospital until he had surgery to treat his problem. Patient states that he was told last night by the radiologist that he had an infection of the bone. I reviewed the patient's MRI scan report with him at bedside which does not mention an infection of the bone does not mention any abscess. I had recommended IV antibiotics followed by course of oral antibiotics to see if this would resolve his symptoms. Patient demanded to be seen by  a orthopedic doctor from [facility] orth[facility] he states he is an established patient with them. The patient does not want me to treat him. I will sign off for now and notified hospitalists of the patient's request.  Thank you for the consult and the opportunity to see Mr. Emelia Loron,  MD [facility] [phone] 12:48 PM

## 2016-02-10 NOTE — Progress Notes (Signed)
MD called back and explained that pt cannot have a picc line at this moment and will have to be stuck for blood specimens. Message relayed to pt

## 2016-02-10 NOTE — Progress Notes (Signed)
Case discussed with Dr. Lajoyce Cornersuda after his consultation.  Pt did not want Dr. Lajoyce Cornersuda to participate any further in his care.  Pt is demanding to be [facility].  I called Dr. Darrelyn HillockGioffre, and he graciously agreed to see the patient.  DTat

## 2016-02-10 NOTE — Consult Note (Signed)
Reason for Consult:Pain and swelling of his  Referring Physician: DR.Tat  Kenneth Charles is an 35 y.o. male.  HPI: Patient had an O.R.I.F of his calcaneus in the past and was doing well untl he went to the [facility] at [facility] and Blood Cultures showed Staph. He walked out of that facility without treatmenti a few weeks ago. He showed up here at [facility] and was admitted by the Medical MD.He was seen by DR. Sharol Given today but refused his care and requested our group.Dr. Lita Mains saw him as well and we discussed his case.   Past Medical History  Diagnosis Date  . Asthma   . Anxiety   . Heroin addiction [facility])     Past Surgical History  Procedure Laterality Date  . Orif calcaneal fracture Left 10/05/2014  . Ankle surgery      Family History  Problem Relation Age of Onset  . Hypertension Father   . Hypertension Mother     Social History:  reports that he has been smoking Cigarettes.  He has been smoking about 0.50 packs per day. He has never used smokeless tobacco. He reports that he uses illicit drugs (Marijuana). He reports that he does not drink alcohol.  Allergies: No Known Allergies  Medications: I have reviewed the patient's current medications.  Results for orders placed or performed during the hospital encounter of 02/09/16 (from the past 48 hour(s))  CBC with Differential     Status: Abnormal   Collection Time: 02/09/16  6:21 PM  Result Value Ref Range   WBC 12.8 (H) 4.0 - 10.5 K/uL   RBC 4.88 4.22 - 5.81 MIL/uL   Hemoglobin 15.0 13.0 - 17.0 g/dL   HCT 44.9 39.0 - 52.0 %   MCV 92.0 78.0 - 100.0 fL   MCH 30.7 26.0 - 34.0 pg   MCHC 33.4 30.0 - 36.0 g/dL   RDW 12.5 11.5 - 15.5 %   Platelets 289 150 - 400 K/uL   Neutrophils Relative % 70 %   Neutro Abs 8.9 (H) 1.7 - 7.7 K/uL   Lymphocytes Relative 17 %   Lymphs Abs 2.2 0.7 - 4.0 K/uL   Monocytes Relative 11 %   Monocytes Absolute 1.5 (H) 0.1 - 1.0 K/uL   Eosinophils Relative 2 %   Eosinophils Absolute 0.2  0.0 - 0.7 K/uL   Basophils Relative 0 %   Basophils Absolute 0.0 0.0 - 0.1 K/uL  Basic metabolic panel     Status: None   Collection Time: 02/09/16  6:21 PM  Result Value Ref Range   Sodium 137 135 - 145 mmol/L   Potassium 4.0 3.5 - 5.1 mmol/L   Chloride 104 101 - 111 mmol/L   CO2 25 22 - 32 mmol/L   Glucose, Bld 90 65 - 99 mg/dL   BUN 8 6 - 20 mg/dL   Creatinine, Ser 0.95 0.61 - 1.24 mg/dL   Calcium 9.0 8.9 - 10.3 mg/dL   GFR calc non Af Amer >60 >60 mL/min   GFR calc Af Amer >60 >60 mL/min    Comment: (NOTE) The eGFR has been calculated using the CKD EPI equation. This calculation has not been validated in all clinical situations. eGFR's persistently <60 mL/min signify possible Chronic Kidney Disease.    Anion gap 8 5 - 15  I-Stat CG4 Lactic Acid, ED     Status: None   Collection Time: 02/09/16  6:27 PM  Result Value Ref Range   Lactic Acid, Venous 0.87  0.5 - 2.0 mmol/L  I-Stat beta hCG blood, ED ([facility], [facility], [facility] only)     Status: None   Collection Time: 02/09/16 10:13 PM  Result Value Ref Range   I-stat hCG, quantitative <5.0 <5 mIU/mL   Comment 3            Comment:   GEST. AGE      CONC.  (mIU/mL)   <=1 WEEK        5 - 50     2 WEEKS       50 - 500     3 WEEKS       100 - 10,000     4 WEEKS     1,000 - 30,000        MALE AND NON-PREGNANT MALE:     LESS THAN 5 mIU/mL   Sedimentation rate     Status: None   Collection Time: 02/10/16 12:51 AM  Result Value Ref Range   Sed Rate 4 0 - 16 mm/hr  C-reactive protein     Status: Abnormal   Collection Time: 02/10/16 12:51 AM  Result Value Ref Range   CRP 8.2 (H) <1.0 mg/dL  Basic metabolic panel     Status: Abnormal   Collection Time: 02/10/16 11:54 AM  Result Value Ref Range   Sodium 132 (L) 135 - 145 mmol/L   Potassium 4.4 3.5 - 5.1 mmol/L   Chloride 100 (L) 101 - 111 mmol/L   CO2 22 22 - 32 mmol/L   Glucose, Bld 109 (H) 65 - 99 mg/dL   BUN 10 6 - 20 mg/dL   Creatinine, Ser 0.89 0.61 - 1.24 mg/dL   Calcium 8.9 8.9  - 10.3 mg/dL   GFR calc non Af Amer >60 >60 mL/min   GFR calc Af Amer >60 >60 mL/min    Comment: (NOTE) The eGFR has been calculated using the CKD EPI equation. This calculation has not been validated in all clinical situations. eGFR's persistently <60 mL/min signify possible Chronic Kidney Disease.    Anion gap 10 5 - 15  CBC     Status: Abnormal   Collection Time: 02/10/16 11:54 AM  Result Value Ref Range   WBC 15.1 (H) 4.0 - 10.5 K/uL   RBC 5.11 4.22 - 5.81 MIL/uL   Hemoglobin 15.6 13.0 - 17.0 g/dL   HCT 46.5 39.0 - 52.0 %   MCV 91.0 78.0 - 100.0 fL   MCH 30.5 26.0 - 34.0 pg   MCHC 33.5 30.0 - 36.0 g/dL   RDW 12.3 11.5 - 15.5 %   Platelets 232 150 - 400 K/uL    Dg Ankle Complete Left  02/08/2016  CLINICAL DATA:  Left ankle pain, previous surgery 1 year ago, pt with ankle swelling and posterior ankle pain and discomfort x 2 days of being unable to bear weight EXAM: LEFT ANKLE COMPLETE - 3+ VIEW COMPARISON:  Of 01/19/2015 FINDINGS: Orthopedic fixation calcaneus. Two of the screws are fractured as previously seen. Increased soft tissue opacity posterior to the ankle joint similar to prior but mildly increased. IMPRESSION: ORIF calcaneus with features unchanged from prior studies the above discussion. Mildly increased soft tissue attenuation posterior to ankle joint. Electronically Signed   By: Skipper Cliche M.D.   On: 02/08/2016 17:26   Mr Ankle Left W Wo Contrast  02/10/2016  CLINICAL DATA:  Possible septic joint.  IV drug abuse. EXAM: MRI OF THE LEFT ANKLE WITHOUT AND WITH CONTRAST TECHNIQUE: Multiplanar, multisequence MR imaging  of the ankle was performed before and after the administration of intravenous contrast. CONTRAST:  35m MULTIHANCE GADOBENATE DIMEGLUMINE 529 MG/ML IV SOLN COMPARISON:  02/08/2016 FINDINGS: A multitude of series were obtained because the patient kept leaving against medical advice multiple times, and ended up being scanned on multiple scan nurse. Calcaneal  plate and screw fixator device significantly reduces diagnostic sensitivity and specificity due to extensive metal artifact. TENDONS Peroneal: Not visible along much of their course, but intact were visible. Posteromedial: Mild tenosynovitis of the flexor hallucis longus at the level of the knot of Henry. Anterior: Unremarkable Achilles: A unremarkable, partially obscured distally. Plantar Fascia: Unremarkable LIGAMENTS Lateral: The inferior tibiofibular ligaments appear intact. Abnormally attenuated anterior talofibular ligament with surrounding inflammatory findings, images 14-16 series 6, likely from a prior tear, anterolateral impingement not excluded. Calcaneofibular ligament not visible, completely obscured. Medial: Thickened superomedial portion of the spring ligament. Inferior components of the spring ligament are thought to be intact. Deltoid ligament seems intact. CARTILAGE Ankle Joint: No significant osteochondral lesion observed. Subtalar Joints/Sinus Tarsi: There is low-level edema in the talus along the posterior subtalar joint with degenerative findings such as a small degenerative subcortical cyst on image 11/4. Low-level edema in the adjacent sinus tarsi, although less than is typically seen in sinus tarsi syndrome. Part of the talus is obscured by metal artifact in the calcaneus. Bones: There is thought to be loss of height of the calcaneus related to the prior fracture, although the calcaneus is essentially completely obscured by the metal artifact. No findings of septic joint. Post-contrast images are severely degraded by motion and field heterogeneity, and are not considered reliable for assessment of enhancement. IMPRESSION: IMPRESSION 1. No compelling findings of septic joint. Trace tibiotalar joint effusion but without compelling synovitis. 2. Edema in the talus is felt to be highly likely to be due to degenerative subtalar arthropathy rather than osteomyelitis. 3. Severely degraded images by  both motion artifact and metal artifact from the calcaneal hardware. 4. Torn anterior talofibular ligament with local inflammatory findings reason the possibility of anterolateral impingement. 5. Ancillary relatively minor findings including mild tenosynovitis of the flexor hallucis longus and thickening of the superomedial portion of the spring ligament. These discussions are primarily in accord with the preliminary report issued by Dr. CRadene Kneeon 02/09/2016. The only potentially significant additional finding is the apparent tear of the ATFL and possibility of anterolateral impingement. Electronically Signed   By: WVan ClinesM.D.   On: 02/10/2016 11:52    Review of Systems  Constitutional: Positive for fever.  HENT: Negative.   Eyes: Negative.   Respiratory: Negative.   Cardiovascular: Negative.   Gastrointestinal: Negative.   Genitourinary: Negative.   Musculoskeletal: Positive for joint pain.  Skin: Negative.   Neurological: Negative.   Endo/Heme/Allergies: Negative.   Psychiatric/Behavioral: Negative.    Blood pressure 131/74, pulse 102, temperature 100.7 F (38.2 C), temperature source Oral, resp. rate 18, height _0  (1.905 m), weight 86.183 kg (190 lb), SpO2 96 %. Physical Exam  Assessment/Plan: I reviewed his Plain Xrays and his MRI of his ankle and there is no evidence of a Sepyic ankle but I do feel that he does have an early infection . We will need to see how he responds to his new Antibiotic regimine and he will be followed closely by Dr. HJohnnye Simaand me. If he develops any signs of pus in his joint we will be required to do an open Arthrotomy of his ankle. For now we will  use a moist heating pad and IV antibiotics for his treatment and watch him closely.  Donoven Pett A 02/10/2016, 1:55 PM

## 2016-02-10 NOTE — Progress Notes (Signed)
Pharmacy Antibiotic Note  Kenneth DeistChristopher B Charles is a 35 y.o. male with h/o IVDA, L ankle pain s/p ORIF 10/2014.  Pharmacy has been consulted for Vancomycin and Zosyn  dosing.  Plan: Vancomycin 1500 mg IV as ordered in ED, then 1250 mg IV q8h Zosyn 3.375 g IV q8h   Height: 6\' 3"  (190.5 cm) Weight: 190 lb (86.183 kg) IBW/kg (Calculated) : 84.5  Temp (24hrs), Avg:98.2 F (36.8 C), Min:98.2 F (36.8 C), Max:98.2 F (36.8 C)   Recent Labs Lab 02/08/16 1930 02/09/16 1821 02/09/16 1827  WBC 11.4* 12.8*  --   CREATININE 0.96 0.95  --   LATICACIDVEN  --   --  0.87    Estimated Creatinine Clearance: 128.5 mL/min (by C-G formula based on Cr of 0.95).    No Known Allergies    Kenneth Charles, Kenneth Charles 02/10/2016 12:48 AM

## 2016-02-10 NOTE — Progress Notes (Signed)
PHARMACY - PHYSICIAN COMMUNICATION CRITICAL VALUE ALERT - BLOOD CULTURE IDENTIFICATION (BCID)  Results for orders placed or performed during the hospital encounter of 02/08/16  Blood Culture ID Panel (Reflexed) (Collected: 02/08/2016  7:30 PM)  Result Value Ref Range   Enterococcus species NOT DETECTED NOT DETECTED   Vancomycin resistance NOT DETECTED NOT DETECTED   Listeria monocytogenes NOT DETECTED NOT DETECTED   Staphylococcus species DETECTED (A) NOT DETECTED   Staphylococcus aureus DETECTED (A) NOT DETECTED   Methicillin resistance NOT DETECTED NOT DETECTED   Streptococcus species NOT DETECTED NOT DETECTED   Streptococcus agalactiae NOT DETECTED NOT DETECTED   Streptococcus pneumoniae NOT DETECTED NOT DETECTED   Streptococcus pyogenes NOT DETECTED NOT DETECTED   Acinetobacter baumannii NOT DETECTED NOT DETECTED   Enterobacteriaceae species NOT DETECTED NOT DETECTED   Enterobacter cloacae complex NOT DETECTED NOT DETECTED   Escherichia coli NOT DETECTED NOT DETECTED   Klebsiella oxytoca NOT DETECTED NOT DETECTED   Klebsiella pneumoniae NOT DETECTED NOT DETECTED   Proteus species NOT DETECTED NOT DETECTED   Serratia marcescens NOT DETECTED NOT DETECTED   Carbapenem resistance NOT DETECTED NOT DETECTED   Haemophilus influenzae NOT DETECTED NOT DETECTED   Neisseria meningitidis NOT DETECTED NOT DETECTED   Pseudomonas aeruginosa NOT DETECTED NOT DETECTED   Candida albicans NOT DETECTED NOT DETECTED   Candida glabrata NOT DETECTED NOT DETECTED   Candida krusei NOT DETECTED NOT DETECTED   Candida parapsilosis NOT DETECTED NOT DETECTED   Candida tropicalis NOT DETECTED NOT DETECTED    Name of physician (or Provider) Contacted: Lenny Pastelom Callahan   Changes to prescribed antibiotics required: No changes, continue vancomycin/zosyn for now  Abran DukeLedford, Rinoa Garramone 02/10/2016  3:13 AM

## 2016-02-10 NOTE — ED Notes (Signed)
Attempted to call report

## 2016-02-10 NOTE — Consult Note (Signed)
[facility] for Infectious Disease  Date of Admission:  02/09/2016  Date of Consult:  02/10/2016  Reason for Consult: Staph bacteremia Referring Physician: CHAMP  Impression/Recommendation Staph bacteremia/MSSA (6-9) L calcaneus fracture with hardware Substance abuse (heroin)  Appreciate ortho f/u Add rifampin (I cautioned him about "red" side effects)  His prev LFTs are normal Change anbx to ancef Check HIV and Hep C CRP 8.2 and ESR 4 Check TEE Repeat BCx are pending.   Comment- With his hx of heroin abuse it is certainly possible he has become bacteremic and seeded his valve and his ankle.     Thank you so much for this interesting consult,   Bobby Rumpf (pager) [phone] [email or web address]  Kenneth Charles is an 35 y.o. male.  HPI: 35 yo M with prev L calcaneous fracture with resulting hardware (2016). He returns 6-9 with worsening pain and inability to bear wt on his L ankle. He was seen in [facility] ED and left with an IV in his arm. Police were called to assist in his care. They reported that he was actively using heroin (per ED notes).  He was brought back to [facility] ED on 6-10 with worsening pain. He had temp ED to 100.7. His WBC was 11.4, now up to 15.1.  He was started on vanco/zosyn.   Past Medical History  Diagnosis Date  . Asthma   . Anxiety   . Heroin addiction [facility])     Past Surgical History  Procedure Laterality Date  . Orif calcaneal fracture Left 10/05/2014  . Ankle surgery       No Known Allergies  Medications:  Scheduled: . fentaNYL (SUBLIMAZE) injection  50 mcg Intravenous Once  . nicotine  21 mg Transdermal Daily  . piperacillin-tazobactam (ZOSYN)  IV  3.375 g Intravenous Q8H  . vancomycin  1,250 mg Intravenous Q8H    Abtx:  Anti-infectives    Start     Dose/Rate Route Frequency Ordered Stop   02/10/16 1200  piperacillin-tazobactam (ZOSYN) IVPB 3.375 g     3.375 g 12.5 mL/hr over 240 Minutes Intravenous Every 8  hours 02/10/16 0429     02/10/16 1000  vancomycin (VANCOCIN) 1,250 mg in sodium chloride 0.9 % 250 mL IVPB     1,250 mg 166.7 mL/hr over 90 Minutes Intravenous Every 8 hours 02/10/16 0054     02/10/16 0800  piperacillin-tazobactam (ZOSYN) IVPB 3.375 g  Status:  Discontinued     3.375 g 12.5 mL/hr over 240 Minutes Intravenous Every 8 hours 02/10/16 0054 02/10/16 0429   02/09/16 2345  piperacillin-tazobactam (ZOSYN) IVPB 3.375 g     3.375 g 100 mL/hr over 30 Minutes Intravenous  Once 02/09/16 2343 02/10/16 0518   02/09/16 2345  vancomycin (VANCOCIN) IVPB 1000 mg/200 mL premix  Status:  Discontinued     1,000 mg 200 mL/hr over 60 Minutes Intravenous  Once 02/09/16 2343 02/09/16 2343   02/09/16 2345  vancomycin (VANCOCIN) 1,500 mg in sodium chloride 0.9 % 500 mL IVPB     1,500 mg 250 mL/hr over 120 Minutes Intravenous  Once 02/09/16 2344 02/10/16 0403      Total days of antibiotics: 1 vanco/zosyn          Social History:  reports that he has been smoking Cigarettes.  He has been smoking about 0.50 packs per day. He has never used smokeless tobacco. He reports that he uses illicit drugs (Marijuana). He reports that he does not drink alcohol.  Family History  Problem Relation Age of Onset  . Hypertension Father   . Hypertension Mother     General ROS: no dysphagia, no f/c at home. normal BM, urinary hesitancy since in hospital, see HPI.  Please see HPI. 12 point ROS o/w (-)  Blood pressure 131/74, pulse 102, temperature 100.7 F (38.2 C), temperature source Oral, resp. rate 18, height 6' 3"  (1.905 m), weight 86.183 kg (190 lb), SpO2 96 %. General appearance: alert, cooperative and no distress Eyes: negative findings: conjunctivae and sclerae normal and pupils equal, round, reactive to light and accomodation Throat: normal findings: oropharynx pink & moist without lesions or evidence of thrush and abnormal findings: on L buccal mucosa there are few small white blisters.  Lungs: clear  to auscultation bilaterally Heart: regular rate and rhythm Abdomen: normal findings: bowel sounds normal and soft, non-tender Extremities: L ankel grossly swollen with tenderness and heat. he has tenderness up his calf into his popliteal fossa.    Results for orders placed or performed during the hospital encounter of 02/09/16 (from the past 48 hour(s))  CBC with Differential     Status: Abnormal   Collection Time: 02/09/16  6:21 PM  Result Value Ref Range   WBC 12.8 (H) 4.0 - 10.5 K/uL   RBC 4.88 4.22 - 5.81 MIL/uL   Hemoglobin 15.0 13.0 - 17.0 g/dL   HCT 44.9 39.0 - 52.0 %   MCV 92.0 78.0 - 100.0 fL   MCH 30.7 26.0 - 34.0 pg   MCHC 33.4 30.0 - 36.0 g/dL   RDW 12.5 11.5 - 15.5 %   Platelets 289 150 - 400 K/uL   Neutrophils Relative % 70 %   Neutro Abs 8.9 (H) 1.7 - 7.7 K/uL   Lymphocytes Relative 17 %   Lymphs Abs 2.2 0.7 - 4.0 K/uL   Monocytes Relative 11 %   Monocytes Absolute 1.5 (H) 0.1 - 1.0 K/uL   Eosinophils Relative 2 %   Eosinophils Absolute 0.2 0.0 - 0.7 K/uL   Basophils Relative 0 %   Basophils Absolute 0.0 0.0 - 0.1 K/uL  Basic metabolic panel     Status: None   Collection Time: 02/09/16  6:21 PM  Result Value Ref Range   Sodium 137 135 - 145 mmol/L   Potassium 4.0 3.5 - 5.1 mmol/L   Chloride 104 101 - 111 mmol/L   CO2 25 22 - 32 mmol/L   Glucose, Bld 90 65 - 99 mg/dL   BUN 8 6 - 20 mg/dL   Creatinine, Ser 0.95 0.61 - 1.24 mg/dL   Calcium 9.0 8.9 - 10.3 mg/dL   GFR calc non Af Amer >60 >60 mL/min   GFR calc Af Amer >60 >60 mL/min    Comment: (NOTE) The eGFR has been calculated using the CKD EPI equation. This calculation has not been validated in all clinical situations. eGFR's persistently <60 mL/min signify possible Chronic Kidney Disease.    Anion gap 8 5 - 15  I-Stat CG4 Lactic Acid, ED     Status: None   Collection Time: 02/09/16  6:27 PM  Result Value Ref Range   Lactic Acid, Venous 0.87 0.5 - 2.0 mmol/L  I-Stat beta hCG blood, ED ([facility], [facility], [facility]  only)     Status: None   Collection Time: 02/09/16 10:13 PM  Result Value Ref Range   I-stat hCG, quantitative <5.0 <5 mIU/mL   Comment 3            Comment:   GEST. AGE  CONC.  (mIU/mL)   <=1 WEEK        5 - 50     2 WEEKS       50 - 500     3 WEEKS       100 - 10,000     4 WEEKS     1,000 - 30,000        MALE AND NON-PREGNANT MALE:     LESS THAN 5 mIU/mL   Sedimentation rate     Status: None   Collection Time: 02/10/16 12:51 AM  Result Value Ref Range   Sed Rate 4 0 - 16 mm/hr  C-reactive protein     Status: Abnormal   Collection Time: 02/10/16 12:51 AM  Result Value Ref Range   CRP 8.2 (H) <1.0 mg/dL  Basic metabolic panel     Status: Abnormal   Collection Time: 02/10/16 11:54 AM  Result Value Ref Range   Sodium 132 (L) 135 - 145 mmol/L   Potassium 4.4 3.5 - 5.1 mmol/L   Chloride 100 (L) 101 - 111 mmol/L   CO2 22 22 - 32 mmol/L   Glucose, Bld 109 (H) 65 - 99 mg/dL   BUN 10 6 - 20 mg/dL   Creatinine, Ser 0.89 0.61 - 1.24 mg/dL   Calcium 8.9 8.9 - 10.3 mg/dL   GFR calc non Af Amer >60 >60 mL/min   GFR calc Af Amer >60 >60 mL/min    Comment: (NOTE) The eGFR has been calculated using the CKD EPI equation. This calculation has not been validated in all clinical situations. eGFR's persistently <60 mL/min signify possible Chronic Kidney Disease.    Anion gap 10 5 - 15  CBC     Status: Abnormal   Collection Time: 02/10/16 11:54 AM  Result Value Ref Range   WBC 15.1 (H) 4.0 - 10.5 K/uL   RBC 5.11 4.22 - 5.81 MIL/uL   Hemoglobin 15.6 13.0 - 17.0 g/dL   HCT 46.5 39.0 - 52.0 %   MCV 91.0 78.0 - 100.0 fL   MCH 30.5 26.0 - 34.0 pg   MCHC 33.5 30.0 - 36.0 g/dL   RDW 12.3 11.5 - 15.5 %   Platelets 232 150 - 400 K/uL      Component Value Date/Time   SDES BLOOD LEFT FOREARM 02/08/2016 1940   SPECREQUEST  02/08/2016 1940    BOTTLES DRAWN AEROBIC AND ANAEROBIC AER 10cc ANA 10cc   CULT  02/08/2016 1940    NO GROWTH 2 DAYS Performed at [facility] PENDING 02/08/2016 1940   Dg Ankle Complete Left  02/08/2016  CLINICAL DATA:  Left ankle pain, previous surgery 1 year ago, pt with ankle swelling and posterior ankle pain and discomfort x 2 days of being unable to bear weight EXAM: LEFT ANKLE COMPLETE - 3+ VIEW COMPARISON:  Of 01/19/2015 FINDINGS: Orthopedic fixation calcaneus. Two of the screws are fractured as previously seen. Increased soft tissue opacity posterior to the ankle joint similar to prior but mildly increased. IMPRESSION: ORIF calcaneus with features unchanged from prior studies the above discussion. Mildly increased soft tissue attenuation posterior to ankle joint. Electronically Signed   By: Skipper Cliche M.D.   On: 02/08/2016 17:26   Mr Ankle Left W Wo Contrast  02/10/2016  CLINICAL DATA:  Possible septic joint.  IV drug abuse. EXAM: MRI OF THE LEFT ANKLE WITHOUT AND WITH CONTRAST TECHNIQUE: Multiplanar, multisequence MR imaging of the ankle was performed before and  after the administration of intravenous contrast. CONTRAST:  77m MULTIHANCE GADOBENATE DIMEGLUMINE 529 MG/ML IV SOLN COMPARISON:  02/08/2016 FINDINGS: A multitude of series were obtained because the patient kept leaving against medical advice multiple times, and ended up being scanned on multiple scan nurse. Calcaneal plate and screw fixator device significantly reduces diagnostic sensitivity and specificity due to extensive metal artifact. TENDONS Peroneal: Not visible along much of their course, but intact were visible. Posteromedial: Mild tenosynovitis of the flexor hallucis longus at the level of the knot of Henry. Anterior: Unremarkable Achilles: A unremarkable, partially obscured distally. Plantar Fascia: Unremarkable LIGAMENTS Lateral: The inferior tibiofibular ligaments appear intact. Abnormally attenuated anterior talofibular ligament with surrounding inflammatory findings, images 14-16 series 6, likely from a prior tear, anterolateral impingement not  excluded. Calcaneofibular ligament not visible, completely obscured. Medial: Thickened superomedial portion of the spring ligament. Inferior components of the spring ligament are thought to be intact. Deltoid ligament seems intact. CARTILAGE Ankle Joint: No significant osteochondral lesion observed. Subtalar Joints/Sinus Tarsi: There is low-level edema in the talus along the posterior subtalar joint with degenerative findings such as a small degenerative subcortical cyst on image 11/4. Low-level edema in the adjacent sinus tarsi, although less than is typically seen in sinus tarsi syndrome. Part of the talus is obscured by metal artifact in the calcaneus. Bones: There is thought to be loss of height of the calcaneus related to the prior fracture, although the calcaneus is essentially completely obscured by the metal artifact. No findings of septic joint. Post-contrast images are severely degraded by motion and field heterogeneity, and are not considered reliable for assessment of enhancement. IMPRESSION: IMPRESSION 1. No compelling findings of septic joint. Trace tibiotalar joint effusion but without compelling synovitis. 2. Edema in the talus is felt to be highly likely to be due to degenerative subtalar arthropathy rather than osteomyelitis. 3. Severely degraded images by both motion artifact and metal artifact from the calcaneal hardware. 4. Torn anterior talofibular ligament with local inflammatory findings reason the possibility of anterolateral impingement. 5. Ancillary relatively minor findings including mild tenosynovitis of the flexor hallucis longus and thickening of the superomedial portion of the spring ligament. These discussions are primarily in accord with the preliminary report issued by Dr. CRadene Kneeon 02/09/2016. The only potentially significant additional finding is the apparent tear of the ATFL and possibility of anterolateral impingement. Electronically Signed   By: WVan ClinesM.D.   On:  02/10/2016 11:52   Recent Results (from the past 240 hour(s))  Blood culture (routine x 2)     Status: Abnormal (Preliminary result)   Collection Time: 02/08/16  7:30 PM  Result Value Ref Range Status   Specimen Description BLOOD LEFT UPPER ARM  Final   Special Requests   Final    BOTTLES DRAWN AEROBIC AND ANAEROBIC AER 10cc ANA 10cc   Culture  Setup Time   Final    GRAM POSITIVE COCCI IN CLUSTERS ANAEROBIC BOTTLE ONLY CRITICAL RESULT CALLED TO, READ BACK BY AND VERIFIED WITH: J LEDFORD,PHARMD AT 0300 02/10/16 BY MCraig GuessPerformed at [facility](A)  Final   Report Status PENDING  Incomplete  Blood Culture ID Panel (Reflexed)     Status: Abnormal   Collection Time: 02/08/16  7:30 PM  Result Value Ref Range Status   Enterococcus species NOT DETECTED NOT DETECTED Final   Vancomycin resistance NOT DETECTED NOT DETECTED Final   Listeria monocytogenes NOT DETECTED NOT DETECTED Final   Staphylococcus  species DETECTED (A) NOT DETECTED Final    Comment: JAMES LEDFORD,PHARMD@0300  02/10/16 MKELLY   Staphylococcus aureus DETECTED (A) NOT DETECTED Final    Comment: JAMES LEDFORD,PHARMD@0300  02/10/16 MKELLY   Methicillin resistance NOT DETECTED NOT DETECTED Final   Streptococcus species NOT DETECTED NOT DETECTED Final   Streptococcus agalactiae NOT DETECTED NOT DETECTED Final   Streptococcus pneumoniae NOT DETECTED NOT DETECTED Final   Streptococcus pyogenes NOT DETECTED NOT DETECTED Final   Acinetobacter baumannii NOT DETECTED NOT DETECTED Final   Enterobacteriaceae species NOT DETECTED NOT DETECTED Final   Enterobacter cloacae complex NOT DETECTED NOT DETECTED Final   Escherichia coli NOT DETECTED NOT DETECTED Final   Klebsiella oxytoca NOT DETECTED NOT DETECTED Final   Klebsiella pneumoniae NOT DETECTED NOT DETECTED Final   Proteus species NOT DETECTED NOT DETECTED Final   Serratia marcescens NOT DETECTED NOT DETECTED Final   Carbapenem resistance  NOT DETECTED NOT DETECTED Final   Haemophilus influenzae NOT DETECTED NOT DETECTED Final   Neisseria meningitidis NOT DETECTED NOT DETECTED Final   Pseudomonas aeruginosa NOT DETECTED NOT DETECTED Final   Candida albicans NOT DETECTED NOT DETECTED Final   Candida glabrata NOT DETECTED NOT DETECTED Final   Candida krusei NOT DETECTED NOT DETECTED Final   Candida parapsilosis NOT DETECTED NOT DETECTED Final   Candida tropicalis NOT DETECTED NOT DETECTED Final  Blood culture (routine x 2)     Status: None (Preliminary result)   Collection Time: 02/08/16  7:40 PM  Result Value Ref Range Status   Specimen Description BLOOD LEFT FOREARM  Final   Special Requests   Final    BOTTLES DRAWN AEROBIC AND ANAEROBIC AER 10cc ANA 10cc   Culture   Final    NO GROWTH 2 DAYS Performed at [facility]    Report Status PENDING  Incomplete      02/10/2016, 1:35 PM     LOS: 0 days    Records and images were personally reviewed where available.       [facility] Staphylococcus aureus bacteremia   Staphylococcus aureus bacteremia (SAB) is associated with a high rate of complications and mortality.  Specific aspects of clinical management are critical to optimizing the outcome of patients with SAB.  Therefore, the [facility] Antimicrobial Management Team [facility]) has initiated an intervention aimed at improving the management of SAB at [facility].  To do so, Infectious Diseases physicians are providing an evidence-based consult for the management of all patients with SAB.     Yes No Comments  Perform follow-up blood cultures (even if the patient is afebrile) to ensure clearance of bacteremia [x]  []    Remove vascular catheter and obtain follow-up blood cultures after the removal of the catheter []  []    Perform echocardiography to evaluate for endocarditis (transthoracic ECHO is 40-50% sensitive, TEE is > 90% sensitive) []  []  Please keep in mind, that neither test  can definitively EXCLUDE endocarditis, and that should clinical suspicion remain high for endocarditis the patient should then still be treated with an "endocarditis" duration of therapy = 6 weeks  Consult electrophysiologist to evaluate implanted cardiac device (pacemaker, ICD) []  []    Ensure source control []  []  Have all abscesses been drained effectively? Have deep seeded infections (septic joints or osteomyelitis) had appropriate surgical debridement?  Investigate for "metastatic" sites of infection [x]  []  Does the patient have ANY symptom or physical exam finding that would suggest a deeper infection (back or neck pain that may be suggestive of vertebral  osteomyelitis or epidural abscess, muscle pain that could be a symptom of pyomyositis)?  Keep in mind that for deep seeded infections MRI imaging with contrast is preferred rather than other often insensitive tests such as plain x-rays, especially early in a patient's presentation.  Change antibiotic therapy to __________________ [x]  []  Beta-lactam antibiotics are preferred for MSSA due to higher cure rates.   If on Vancomycin, goal trough should be 15 - 20 mcg/mL  Estimated duration of IV antibiotic therapy:   []  []  Consult case management for probably prolonged outpatient IV antibiotic therapy

## 2016-02-10 NOTE — Progress Notes (Signed)
Per IV team, pt is very difficult start. IV nurse suggests central line placement. No labs have been drawn due to poor vein access. Both lab and IV nurses unable to draw blood. Will continue to monitor. Cindee SaltMcBride,Billiejo Sorto K, RN

## 2016-02-10 NOTE — Progress Notes (Signed)
MD notified via txt page that pt is refusing lab draws

## 2016-02-10 NOTE — H&P (Signed)
[facility]                                                                                    Patient Demographics  [name], is a 35 y.o. male  CSN: [id number]  MRN: [id number]  DOB - 1981-01-26  Admit Date - 02/09/2016  Outpatient Primary MD for the patient is Billee Cashing, MD   With History of -  Past Medical History  Diagnosis Date  . Asthma   . Anxiety   . Heroin addiction [facility])       Past Surgical History  Procedure Laterality Date  . Orif calcaneal fracture Left 10/05/2014  . Ankle surgery      in for   Chief Complaint  Patient presents with  . Foot Pain     HPI  Kenneth Charles  is a 35 y.o. male, With past medical history significant for left calcaneal fracture status post repair in 2016 with hardware inserted presenting today for 2 days history of increased swelling and pain left heel area with fever. Patient has history of drug abuse but reports that he never used any IV drug abuse in the last 8 months. Denies any chest pains or shortness of breath. Reports nausea but no vomiting . MRI was done however was not very helpful due to artifact.    Review of Systems    In addition to the HPI above, No Headache, No changes with Vision or hearing, No problems swallowing food or Liquids, No Chest pain, Cough or Shortness of Breath, No Abdominal pain, No  Vommitting, Bowel movements are regular, No Blood in stool or Urine, No dysuria, No new skin rashes or bruises,  No new weakness, tingling, numbness in any extremity, No recent weight gain or loss, No polyuria, polydypsia or polyphagia, No significant Mental Stressors.  A full 10 point Review of Systems was done, except as stated above, all other Review of Systems were negative.   Social History Social History  Substance Use Topics  . Smoking status: Current Every Day Smoker -- 0.50 packs/day    Types: Cigarettes  . Smokeless tobacco: Never Used  . Alcohol Use:  No    Family History Family History  Problem Relation Age of Onset  . Hypertension Father   . Hypertension Mother      Prior to Admission medications   Medication Sig Start Date End Date Taking? Authorizing Provider  albuterol (PROVENTIL HFA;VENTOLIN HFA) 108 (90 Base) MCG/ACT inhaler Inhale 2 puffs into the lungs every 6 (six) hours as needed for wheezing or shortness of breath.   Yes Historical Provider, MD  Oxycodone HCl 10 MG TABS Take 10 mg by mouth 3 (three) times daily.    Yes Historical Provider, MD    No Known Allergies  Physical Exam  Vitals  Blood pressure 156/104, pulse 110, temperature 98.2 F (36.8 C), temperature source Oral, resp. rate 18, height  (1.905 m), weight 86.183 kg (190 lb), SpO2 96 %.   1. General  Well-developed well-nourished male in pain  2. Normal affect and insight, Not Suicidal or Homicidal, Awake Alert, Oriented X 3.  3. No F.N deficits, grossly-moving all extremities  4. Ears and Eyes appear Normal, Conjunctivae clear, PERRLA. Moist Oral Mucosa.  5. Supple Neck, No JVD, No cervical lymphadenopathy appriciated, No Carotid Bruits.  6. Symmetrical Chest wall movement, Good air movement bilaterally, CTAB.  7. RRR, No Gallops, Rubs or Murmurs, No Parasternal Heave.  8. Positive Bowel Sounds, Abdomen Soft, Non tender, No organomegaly appriciated,No rebound -guarding or rigidity.  9.  No Cyanosis, Normal Skin Turgor, No Skin Rash or Bruise.  10. Good muscle tone,  joints appear normal , no effusions, left heel, swollen.    Data Review  CBC  Recent Labs Lab 02/08/16 1930 02/09/16 1821  WBC 11.4* 12.8*  HGB 14.9 15.0  HCT 43.6 44.9  PLT 287 289  MCV 92.2 92.0  MCH 31.5 30.7  MCHC 34.2 33.4  RDW 12.4 12.5  LYMPHSABS 1.8 2.2  MONOABS 1.1* 1.5*  EOSABS 0.2 0.2  BASOSABS 0.0 0.0   ------------------------------------------------------------------------------------------------------------------  Chemistries   Recent  Labs Lab 02/08/16 1930 02/09/16 1821  NA 137 137  K 3.7 4.0  CL 103 104  CO2 26 25  GLUCOSE 119* 90  BUN 14 8  CREATININE 0.96 0.95  CALCIUM 8.9 9.0   ------------------------------------------------------------------------------------------------------------------ estimated creatinine clearance is 128.5 mL/min (by C-G formula based on Cr of 0.95). ------------------------------------------------------------------------------------------------------------------ No results for input(s): TSH, T4TOTAL, T3FREE, THYROIDAB in the last 72 hours.  Invalid input(s): FREET3   Coagulation profile No results for input(s): INR, PROTIME in the last 168 hours. ------------------------------------------------------------------------------------------------------------------- No results for input(s): DDIMER in the last 72 hours. -------------------------------------------------------------------------------------------------------------------  Cardiac Enzymes No results for input(s): CKMB, TROPONINI, MYOGLOBIN in the last 168 hours.  Invalid input(s): CK ------------------------------------------------------------------------------------------------------------------ Invalid input(s): POCBNP   ---------------------------------------------------------------------------------------------------------------  Urinalysis    Component Value Date/Time   COLORURINE YELLOW 02/17/2015 2130   APPEARANCEUR CLEAR 02/17/2015 2130   LABSPEC 1.014 02/17/2015 2130   PHURINE 5.5 02/17/2015 2130   GLUCOSEU NEGATIVE 02/17/2015 2130   HGBUR NEGATIVE 02/17/2015 2130   BILIRUBINUR NEGATIVE 02/17/2015 2130   KETONESUR NEGATIVE 02/17/2015 2130   PROTEINUR NEGATIVE 02/17/2015 2130   UROBILINOGEN 0.2 02/17/2015 2130   NITRITE NEGATIVE 02/17/2015 2130   LEUKOCYTESUR NEGATIVE 02/17/2015 2130     ----------------------------------------------------------------------------------------------------------------     Imaging results:   Dg Ankle Complete Left  02/08/2016  CLINICAL DATA:  Left ankle pain, previous surgery 1 year ago, pt with ankle swelling and posterior ankle pain and discomfort x 2 days of being unable to bear weight EXAM: LEFT ANKLE COMPLETE - 3+ VIEW COMPARISON:  Of 01/19/2015 FINDINGS: Orthopedic fixation calcaneus. Two of the screws are fractured as previously seen. Increased soft tissue opacity posterior to the ankle joint similar to prior but mildly increased. IMPRESSION: ORIF calcaneus with features unchanged from prior studies the above discussion. Mildly increased soft tissue attenuation posterior to ankle joint. Electronically Signed   By: Esperanza Heiraymond  Rubner M.D.   On: 02/08/2016 17:26      Assessment & Plan   1. Left calcaneal septic arthritis with hardware present 2. History of asthma 3. History of substance abuse  Plan  IV antibiotics, vancomycin and Zosyn Please consult orthopedic surgery in a.m. Check sedimentation rate Check cultures Awaiting official reading of MRI     DVT Prophylaxis Lovenox  AM Labs Ordered, also please review Full Orders    Code Status Full  Disposition Plan: unknown  Time spent in minutes : 37 min  Condition GUARDED   @SIGNATURE @

## 2016-02-10 NOTE — Progress Notes (Addendum)
PROGRESS NOTE  GOTHAM RADEN HOZ:[id number] DOB: 1981-03-10 DOA: 02/09/2016 PCP: Ricke Hey, MD  Brief History:  35 year old male with a history of polysubstance abuse presents with 3-4 day history of increasing pain and edema of his left ankle. The patient denies any recent injury. The patient visited the emergency department on 02/08/2016. CT of the ankle was ordered at that time, but the patient eloped with his IV intact. He returned on 02/09/2016 with increasing pain and edema. He states that he last snorted cocaine about 1 week prior to admission.  Last injected heroin 7-8 months ago. X-ray of his left ankle on 02/08/2016 showed increasing soft tissue opacity posterior to left ankle.  Assessment/Plan: Cellulitis left foot -Concern about septic arthritis -MRI results pending -I have consulted ortho-Dr. Sharol Given -continue IV abx -ESR 4 -CRP 8.2  Question bacteremia -BCID positive but cultures neg so far -continue vancomycin empirically given pt's clinical hx  Polysubstance abuse -last snorted cocaine one week prior to admit -last used heroin 7-8 months prior to admit -UDS  Tobacco abuse -nicoderm patch -cessation discussed  Chronic pain -pt on oxycodone 46m tid prior to admission -continue hydromorphone 1 mg q 3 hours prn pain -I have explained to the patient it is not realistic to zero pain      Disposition Plan:   Home in 2-3 days  Family Communication:   Girlfriend updated at beside--total time 40 min; [phone] >50% spent counseling and coordinating care  Consultants: Ortho--Duda    Code Status:  FULL    Subjective: Patient complains of pain in left ankle. Denies any nausea, vomiting, diarrhea, belly pain, dysuria, hematuria. Denies any chest pain or shortness of breath.  Objective: Filed Vitals:   02/10/16 0000 02/10/16 0015 02/10/16 0200 02/10/16 0433  BP: 132/72 156/104 141/83 131/74  Pulse: 100 110 105 102  Temp:   99.2 F  (37.3 C) 100.7 F (38.2 C)  TempSrc:   Oral Oral  Resp:   20 18  Height:      Weight:      SpO2: 92% 96% 97% 96%    Intake/Output Summary (Last 24 hours) at 02/10/16 [id number] data filed at 02/10/16 0434  Gross per 24 hour  Intake    120 ml  Output      0 ml  Net    120 ml   Weight change:  Exam:   General:  Pt is alert, follows commands appropriately, not in acute distress  HEENT: No icterus, No thrush, No neck mass, [place]/AT  Cardiovascular: RRR, S1/S2, no rubs, no gallops  Respiratory: CTA bilaterally, no wheezing, no crackles, no rhonchi  Abdomen: Soft/+BS, non tender, non distended, no guarding  Extremities: L ankle edema with surrounding erythema, No lymphangitis, No petechiae, No rashes, no synovitis   Data Reviewed: I have personally reviewed following labs and imaging studies Basic Metabolic Panel:  Recent Labs Lab 02/08/16 1930 02/09/16 1821  NA 137 137  K 3.7 4.0  CL 103 104  CO2 26 25  GLUCOSE 119* 90  BUN 14 8  CREATININE 0.96 0.95  CALCIUM 8.9 9.0   Liver Function Tests: No results for input(s): AST, ALT, ALKPHOS, BILITOT, PROT, ALBUMIN in the last 168 hours. No results for input(s): LIPASE, AMYLASE in the last 168 hours. No results for input(s): AMMONIA in the last 168 hours. Coagulation Profile: No results for input(s): INR, PROTIME in the last 168 hours. CBC:  Recent Labs Lab 02/08/16  1930 02/09/16 1821  WBC 11.4* 12.8*  NEUTROABS 8.3* 8.9*  HGB 14.9 15.0  HCT 43.6 44.9  MCV 92.2 92.0  PLT 287 289   Cardiac Enzymes: No results for input(s): CKTOTAL, CKMB, CKMBINDEX, TROPONINI in the last 168 hours. BNP: Invalid input(s): POCBNP CBG: No results for input(s): GLUCAP in the last 168 hours. HbA1C: No results for input(s): HGBA1C in the last 72 hours. Urine analysis:    Component Value Date/Time   COLORURINE YELLOW 02/17/2015 2130   APPEARANCEUR CLEAR 02/17/2015 2130   LABSPEC 1.014 02/17/2015 2130   PHURINE 5.5 02/17/2015  2130   GLUCOSEU NEGATIVE 02/17/2015 2130   HGBUR NEGATIVE 02/17/2015 2130   BILIRUBINUR NEGATIVE 02/17/2015 2130   KETONESUR NEGATIVE 02/17/2015 2130   PROTEINUR NEGATIVE 02/17/2015 2130   UROBILINOGEN 0.2 02/17/2015 2130   NITRITE NEGATIVE 02/17/2015 2130   LEUKOCYTESUR NEGATIVE 02/17/2015 2130   Sepsis Labs: '@LABRCNTIP'$ (procalcitonin:4,lacticidven:4) ) Recent Results (from the past 240 hour(s))  Blood culture (routine x 2)     Status: None (Preliminary result)   Collection Time: 02/08/16  7:30 PM  Result Value Ref Range Status   Specimen Description BLOOD LEFT UPPER ARM  Final   Special Requests   Final    BOTTLES DRAWN AEROBIC AND ANAEROBIC AER 10cc ANA 10cc   Culture  Setup Time   Final    GRAM POSITIVE COCCI IN CLUSTERS ANAEROBIC BOTTLE ONLY CRITICAL RESULT CALLED TO, READ BACK BY AND VERIFIED WITH: J LEDFORD,PHARMD AT 0300 02/10/16 BY Craig Guess Performed at [facility]    Culture PENDING  Incomplete   Report Status PENDING  Incomplete  Blood Culture ID Panel (Reflexed)     Status: Abnormal   Collection Time: 02/08/16  7:30 PM  Result Value Ref Range Status   Enterococcus species NOT DETECTED NOT DETECTED Final   Vancomycin resistance NOT DETECTED NOT DETECTED Final   Listeria monocytogenes NOT DETECTED NOT DETECTED Final   Staphylococcus species DETECTED (A) NOT DETECTED Final    Comment: JAMES LEDFORD,PHARMD'@0300'$  02/10/16 MKELLY   Staphylococcus aureus DETECTED (A) NOT DETECTED Final    Comment: JAMES LEDFORD,PHARMD'@0300'$  02/10/16 MKELLY   Methicillin resistance NOT DETECTED NOT DETECTED Final   Streptococcus species NOT DETECTED NOT DETECTED Final   Streptococcus agalactiae NOT DETECTED NOT DETECTED Final   Streptococcus pneumoniae NOT DETECTED NOT DETECTED Final   Streptococcus pyogenes NOT DETECTED NOT DETECTED Final   Acinetobacter baumannii NOT DETECTED NOT DETECTED Final   Enterobacteriaceae species NOT DETECTED NOT DETECTED Final   Enterobacter cloacae  complex NOT DETECTED NOT DETECTED Final   Escherichia coli NOT DETECTED NOT DETECTED Final   Klebsiella oxytoca NOT DETECTED NOT DETECTED Final   Klebsiella pneumoniae NOT DETECTED NOT DETECTED Final   Proteus species NOT DETECTED NOT DETECTED Final   Serratia marcescens NOT DETECTED NOT DETECTED Final   Carbapenem resistance NOT DETECTED NOT DETECTED Final   Haemophilus influenzae NOT DETECTED NOT DETECTED Final   Neisseria meningitidis NOT DETECTED NOT DETECTED Final   Pseudomonas aeruginosa NOT DETECTED NOT DETECTED Final   Candida albicans NOT DETECTED NOT DETECTED Final   Candida glabrata NOT DETECTED NOT DETECTED Final   Candida krusei NOT DETECTED NOT DETECTED Final   Candida parapsilosis NOT DETECTED NOT DETECTED Final   Candida tropicalis NOT DETECTED NOT DETECTED Final  Blood culture (routine x 2)     Status: None (Preliminary result)   Collection Time: 02/08/16  7:40 PM  Result Value Ref Range Status   Specimen Description BLOOD LEFT FOREARM  Final   Special Requests   Final    BOTTLES DRAWN AEROBIC AND ANAEROBIC AER 10cc ANA 10cc   Culture   Final    NO GROWTH < 24 HOURS Performed at [facility]    Report Status PENDING  Incomplete     Scheduled Meds: . enoxaparin (LOVENOX) injection  40 mg Subcutaneous Daily  . fentaNYL (SUBLIMAZE) injection  50 mcg Intravenous Once  . nicotine  14 mg Transdermal Once  . piperacillin-tazobactam (ZOSYN)  IV  3.375 g Intravenous Q8H  . vancomycin  1,250 mg Intravenous Q8H   Continuous Infusions:   Procedures/Studies: Dg Ankle Complete Left  February 10, 2016  CLINICAL DATA:  Left ankle pain, previous surgery 1 year ago, pt with ankle swelling and posterior ankle pain and discomfort x 2 days of being unable to bear weight EXAM: LEFT ANKLE COMPLETE - 3+ VIEW COMPARISON:  Of 01/19/2015 FINDINGS: Orthopedic fixation calcaneus. Two of the screws are fractured as previously seen. Increased soft tissue opacity posterior to the ankle joint  similar to prior but mildly increased. IMPRESSION: ORIF calcaneus with features unchanged from prior studies the above discussion. Mildly increased soft tissue attenuation posterior to ankle joint. Electronically Signed   By: Skipper Cliche M.D.   On: 02-10-16 17:26    Timiko Offutt, DO  [facility] Pager [phone]  If 7PM-7AM, please contact night-coverage [email or web address] Password [email or web address] 02/10/2016, 9:22 AM   LOS: 0 days

## 2016-02-10 NOTE — Progress Notes (Signed)
Unsuccessful attempt to draw labwork for HIV and HPC this pm by phlebotomy. Will try again around 1900hrs this evening

## 2016-02-10 NOTE — Progress Notes (Signed)
Pt declined lab draw stating that he will be ok with lab draws until after he gets a picc line but until then, he is not letting anyone stick him as he has had multiple unsuccessful attempts

## 2016-02-10 NOTE — Progress Notes (Signed)
Received Pt. Form ER, initial assessment done but patient did not allow RN to look at his skin. Pt Vanco was started but was stop because he was complaining of pain at IV site and there was no blood return. IV consult was placed. Patient requested his Dilaudid to be increased MD page and he ordered Dilaudid 0.5 mg start but was not given on time because of lack of good IV access.

## 2016-02-11 ENCOUNTER — Encounter (HOSPITAL_COMMUNITY): Payer: Self-pay | Admitting: General Practice

## 2016-02-11 DIAGNOSIS — M25473 Effusion, unspecified ankle: Secondary | ICD-10-CM | POA: Insufficient documentation

## 2016-02-11 DIAGNOSIS — M25579 Pain in unspecified ankle and joints of unspecified foot: Secondary | ICD-10-CM

## 2016-02-11 DIAGNOSIS — F1120 Opioid dependence, uncomplicated: Secondary | ICD-10-CM

## 2016-02-11 DIAGNOSIS — Z9119 Patient's noncompliance with other medical treatment and regimen: Secondary | ICD-10-CM

## 2016-02-11 DIAGNOSIS — M7989 Other specified soft tissue disorders: Secondary | ICD-10-CM

## 2016-02-11 DIAGNOSIS — R7881 Bacteremia: Secondary | ICD-10-CM

## 2016-02-11 LAB — BLOOD CULTURE ID PANEL (REFLEXED)

## 2016-02-11 MED ORDER — OXYCODONE HCL ER 15 MG PO T12A
30.0000 mg | EXTENDED_RELEASE_TABLET | Freq: Two times a day (BID) | ORAL | Status: DC
  Administered 2016-02-11 – 2016-02-14 (×7): 30 mg via ORAL
  Filled 2016-02-11 (×7): qty 2

## 2016-02-11 MED ORDER — HYDROMORPHONE HCL 1 MG/ML IJ SOLN
0.5000 mg | Freq: Once | INTRAMUSCULAR | Status: AC
  Administered 2016-02-11: 0.5 mg via INTRAVENOUS
  Filled 2016-02-11: qty 1

## 2016-02-11 MED ORDER — ENOXAPARIN SODIUM 40 MG/0.4ML ~~LOC~~ SOLN
40.0000 mg | SUBCUTANEOUS | Status: DC
  Administered 2016-02-11 – 2016-02-13 (×3): 40 mg via SUBCUTANEOUS
  Filled 2016-02-11 (×3): qty 0.4

## 2016-02-11 NOTE — Progress Notes (Signed)
Pt. continues to direct his anger at the staff.Pt. is yelling out and accusing staff of with holding his meds and/or not giving them on time. He had demanded to see MD for the second time this shift. He is at this time refusing lab draws,thrashing around in bed and hollering.

## 2016-02-11 NOTE — Progress Notes (Signed)
    [facility] has been requested to perform a transesophageal echocardiogram on 02/11/2016 for bacteremia.  After careful review of history and examination, the risks and benefits of transesophageal echocardiogram have been explained including risks of esophageal damage, perforation (1:10,000 risk), bleeding, pharyngeal hematoma as well as other potential complications associated with conscious sedation including aspiration, arrhythmia, respiratory failure and death. Alternatives to treatment were discussed, questions were answered. Patient is willing to proceed.   35 yo make with PMH of polysubstance abuse came in with cellulitis of L ankle. He initially left AMA with IV, but later came back with worsening pain. Blood culture positive for MSSR. Cardiology coonsulted for TEE to r/o SBE. He is still febrile with temp 101.6. Vital sign otherwise is normal. Hgb and platelet normal.  Kenneth CourseHao Tsuruko Murtha, PA-C 02/11/2016 9:49 PM

## 2016-02-11 NOTE — Progress Notes (Signed)
PROGRESS NOTE  Kenneth Charles VOH:[id number] DOB: Jan 19, 1981 DOA: 02/09/2016 PCP: Ricke Hey, MD Brief History:  35 year old male with a history of polysubstance abuse presents with 3-4 day history of increasing pain and edema of his left ankle. The patient denies any recent injury. The patient visited the emergency department on 02/08/2016. CT of the ankle was ordered at that time, but the patient eloped with his IV intact. He returned on 02/09/2016 with increasing pain and edema. He states that he last snorted cocaine about 1 week prior to admission. Last injected heroin 7-8 months ago. X-ray of his left ankle on 02/08/2016 showed increasing soft tissue opacity posterior to left ankle.  Assessment/Plan: Cellulitis left foot -MRI results--no compelling findings of septic joint, trace tibial talar joint effusion, torn talofibular ligament with inflammatory changes, mild tenosynovitis flexor hallucis longus tendon -Patient has dismissed Dr. Sharol Given from his care -appreciate Dr. Gladstone Lighter consult -continue IV abx -ESR 4 -CRP 8.2  MSSA bacteremia -02/08/16--blood culture MSSA -vancomycin-->cefazolin (started 02/10/16) -consult cardiology for TEE  Chronic pain/opioid seeking behavior -pt on oxycodone 12m tid prior to admission -continue hydromorphone 1 mg q 3 hours prn pain -I have explained to the patient it is not realistic to zero pain -pt refusing blood draws because "it took 30 sticks" to get any blood -Patient states that intravenous hydromorphone helps his pain but it is not lasting him long enough, and that he has to go approximately 1 hour with pain before his next dosing -I have explained to the patient that increasing the dose of his hydromorphone is not the appropriate therapy -I explain him that he would benefit from a long-acting opioid, and that I would start long-acting OxyContin 30 mg twice a day at which point he stated that that would not be enough for  him as he felt like he has built significant tolerance -he stated he is "allergic" to morphine and wanted his IV hydromorphone increased -I told the patient I would not be changing the dose or frequency of his intravenous hydromorphone.  Polysubstance abuse -last snorted cocaine one week prior to admit -pt states he last used heroin 7-8 months prior to admit, but police reported pt was actively using when he left AMA on 6/9 -UDS--positive THC and opioids  Tobacco abuse -nicoderm patch -cessation discussed    Disposition Plan: Home in 2-3 days  Family Communication: Girlfriend updated at beside  Consultants: Ortho--Dudaand Gioffre  Code Status: FULL  Subjective: Patient continues to complain of pain in his right ankle. He refuses blood draws. Denies any headache, neck pain, sharp, shortness breath, nausea, vomiting, diarrhea. No abdominal pain.  Objective: Filed Vitals:   02/10/16 0433 02/10/16 1415 02/10/16 2132 02/11/16 0427  BP: 131/74 140/93 139/79 134/85  Pulse: 102 106 117 108  Temp: 100.7 F (38.2 C) 98.6 F (37 C) 101.6 F (38.7 C) 100 F (37.8 C)  TempSrc: Oral Axillary Oral Oral  Resp: _0 Height:      Weight:      SpO2: 96% 96% 98% 95%    Intake/Output Summary (Last 24 hours) at 02/11/16 1241 Last data filed at 02/11/16 0930  Gross per 24 hour  Intake    820 ml  Output      0 ml  Net    820 ml   Weight change:  Exam:   General:  Pt is alert, follows commands appropriately, not in acute distress  HEENT: No  icterus, No thrush, No neck mass, [place]/AT  Cardiovascular: RRR, S1/S2, no rubs, no gallops  Respiratory: CTA bilaterally, no wheezing, no crackles, no rhonchi  Abdomen: Soft/+BS, non tender, non distended, no guarding  Extremities: No edema, No lymphangitis, No petechiae, No rashes; Right ankle with localized erythema and ecchymosis on the lateral malleolar area. No crepitance. No draining wounds.   Data Reviewed: I have  personally reviewed following labs and imaging studies Basic Metabolic Panel:  Recent Labs Lab 02/08/16 1930 02/09/16 1821 02/10/16 1154  NA 137 137 132*  K 3.7 4.0 4.4  CL 103 104 100*  CO2 _0 GLUCOSE 119* 90 109*  BUN _1 CREATININE 0.96 0.95 0.89  CALCIUM 8.9 9.0 8.9   Liver Function Tests: No results for input(s): AST, ALT, ALKPHOS, BILITOT, PROT, ALBUMIN in the last 168 hours. No results for input(s): LIPASE, AMYLASE in the last 168 hours. No results for input(s): AMMONIA in the last 168 hours. Coagulation Profile: No results for input(s): INR, PROTIME in the last 168 hours. CBC:  Recent Labs Lab 02/08/16 1930 02/09/16 1821 02/10/16 1154  WBC 11.4* 12.8* 15.1*  NEUTROABS 8.3* 8.9*  --   HGB 14.9 15.0 15.6  HCT 43.6 44.9 46.5  MCV 92.2 92.0 91.0  PLT 287 289 232   Cardiac Enzymes: No results for input(s): CKTOTAL, CKMB, CKMBINDEX, TROPONINI in the last 168 hours. BNP: Invalid input(s): POCBNP CBG: No results for input(s): GLUCAP in the last 168 hours. HbA1C: No results for input(s): HGBA1C in the last 72 hours. Urine analysis:    Component Value Date/Time   COLORURINE YELLOW 02/17/2015 2130   APPEARANCEUR CLEAR 02/17/2015 2130   LABSPEC 1.014 02/17/2015 2130   PHURINE 5.5 02/17/2015 2130   GLUCOSEU NEGATIVE 02/17/2015 2130   HGBUR NEGATIVE 02/17/2015 2130   BILIRUBINUR NEGATIVE 02/17/2015 2130   KETONESUR NEGATIVE 02/17/2015 2130   PROTEINUR NEGATIVE 02/17/2015 2130   UROBILINOGEN 0.2 02/17/2015 2130   NITRITE NEGATIVE 02/17/2015 2130   LEUKOCYTESUR NEGATIVE 02/17/2015 2130   Sepsis Labs: _2 (procalcitonin:4,lacticidven:4) ) Recent Results (from the past 240 hour(s))  Blood culture (routine x 2)     Status: Abnormal (Preliminary result)   Collection Time: 02/08/16  7:30 PM  Result Value Ref Range Status   Specimen Description BLOOD LEFT UPPER ARM  Final   Special Requests   Final    BOTTLES DRAWN AEROBIC AND ANAEROBIC AER  10cc ANA 10cc   Culture  Setup Time   Final    GRAM POSITIVE COCCI IN CLUSTERS ANAEROBIC BOTTLE ONLY CRITICAL RESULT CALLED TO, READ BACK BY AND VERIFIED WITH: J LEDFORD,PHARMD AT 0300 02/10/16 BY M KELLY    Culture STAPHYLOCOCCUS AUREUS (A)  Final   Report Status PENDING  Incomplete   Organism ID, Bacteria STAPHYLOCOCCUS AUREUS  Final      Susceptibility   Staphylococcus aureus - MIC*    CIPROFLOXACIN <=0.5 SENSITIVE Sensitive     ERYTHROMYCIN <=0.25 SENSITIVE Sensitive     GENTAMICIN <=0.5 SENSITIVE Sensitive     OXACILLIN 0.5 SENSITIVE Sensitive     TETRACYCLINE <=1 SENSITIVE Sensitive     VANCOMYCIN <=0.5 SENSITIVE Sensitive     TRIMETH/SULFA <=10 SENSITIVE Sensitive     CLINDAMYCIN <=0.25 SENSITIVE Sensitive     RIFAMPIN <=0.5 SENSITIVE Sensitive     Inducible Clindamycin Value in next row Sensitive      NEGATIVEPerformed at [place]  Blood Culture ID Panel (Reflexed)  Status: Abnormal   Collection Time: 02/16/2016  7:30 PM  Result Value Ref Range Status   Enterococcus species NOT DETECTED NOT DETECTED Final   Vancomycin resistance NOT DETECTED NOT DETECTED Final   Listeria monocytogenes NOT DETECTED NOT DETECTED Final   Staphylococcus species DETECTED (A) NOT DETECTED Final    Comment: JAMES LEDFORD,PHARMD_0  02/10/16 MKELLY   Staphylococcus aureus DETECTED (A) NOT DETECTED Final    Comment: JAMES LEDFORD,PHARMD_1  02/10/16 MKELLY   Methicillin resistance NOT DETECTED NOT DETECTED Final   Streptococcus species NOT DETECTED NOT DETECTED Final   Streptococcus agalactiae NOT DETECTED NOT DETECTED Final   Streptococcus pneumoniae NOT DETECTED NOT DETECTED Final   Streptococcus pyogenes NOT DETECTED NOT DETECTED Final   Acinetobacter baumannii NOT DETECTED NOT DETECTED Final   Enterobacteriaceae species NOT DETECTED NOT DETECTED Final   Enterobacter cloacae complex NOT DETECTED NOT DETECTED Final   Escherichia coli NOT DETECTED NOT  DETECTED Final   Klebsiella oxytoca NOT DETECTED NOT DETECTED Final   Klebsiella pneumoniae NOT DETECTED NOT DETECTED Final   Proteus species NOT DETECTED NOT DETECTED Final   Serratia marcescens NOT DETECTED NOT DETECTED Final   Carbapenem resistance NOT DETECTED NOT DETECTED Final   Haemophilus influenzae NOT DETECTED NOT DETECTED Final   Neisseria meningitidis NOT DETECTED NOT DETECTED Final   Pseudomonas aeruginosa NOT DETECTED NOT DETECTED Final   Candida albicans NOT DETECTED NOT DETECTED Final   Candida glabrata NOT DETECTED NOT DETECTED Final   Candida krusei NOT DETECTED NOT DETECTED Final   Candida parapsilosis NOT DETECTED NOT DETECTED Final   Candida tropicalis NOT DETECTED NOT DETECTED Final  Blood culture (routine x 2)     Status: None (Preliminary result)   Collection Time: 02-16-16  7:40 PM  Result Value Ref Range Status   Specimen Description BLOOD LEFT FOREARM  Final   Special Requests   Final    BOTTLES DRAWN AEROBIC AND ANAEROBIC AER 10cc ANA 10cc   Culture   Final    NO GROWTH 2 DAYS Performed at [facility]    Report Status PENDING  Incomplete  Blood culture (routine x 2)     Status: None (Preliminary result)   Collection Time: 02/10/16 12:30 AM  Result Value Ref Range Status   Specimen Description BLOOD RIGHT HAND  Final   Special Requests AEROBIC BOTTLE ONLY 5ML  Final   Culture  Setup Time   Final    GRAM POSITIVE RODS AEROBIC BOTTLE ONLY Organism ID to follow    Culture PENDING  Incomplete   Report Status PENDING  Incomplete     Scheduled Meds: .  ceFAZolin (ANCEF) IV  2 g Intravenous Q8H  . fentaNYL (SUBLIMAZE) injection  50 mcg Intravenous Once  . nicotine  21 mg Transdermal Daily  . rifampin  300 mg Oral Q12H   Continuous Infusions:   Procedures/Studies: Dg Ankle Complete Left  02/16/2016  CLINICAL DATA:  Left ankle pain, previous surgery 1 year ago, pt with ankle swelling and posterior ankle pain and discomfort x 2 days of being  unable to bear weight EXAM: LEFT ANKLE COMPLETE - 3+ VIEW COMPARISON:  Of 01/19/2015 FINDINGS: Orthopedic fixation calcaneus. Two of the screws are fractured as previously seen. Increased soft tissue opacity posterior to the ankle joint similar to prior but mildly increased. IMPRESSION: ORIF calcaneus with features unchanged from prior studies the above discussion. Mildly increased soft tissue attenuation posterior to ankle joint. Electronically Signed   By: Skipper Cliche M.D.   On:  02/08/2016 17:26   Mr Ankle Left W Wo Contrast  02/10/2016  CLINICAL DATA:  Possible septic joint.  IV drug abuse. EXAM: MRI OF THE LEFT ANKLE WITHOUT AND WITH CONTRAST TECHNIQUE: Multiplanar, multisequence MR imaging of the ankle was performed before and after the administration of intravenous contrast. CONTRAST:  63m MULTIHANCE GADOBENATE DIMEGLUMINE 529 MG/ML IV SOLN COMPARISON:  02/08/2016 FINDINGS: A multitude of series were obtained because the patient kept leaving against medical advice multiple times, and ended up being scanned on multiple scan nurse. Calcaneal plate and screw fixator device significantly reduces diagnostic sensitivity and specificity due to extensive metal artifact. TENDONS Peroneal: Not visible along much of their course, but intact were visible. Posteromedial: Mild tenosynovitis of the flexor hallucis longus at the level of the knot of [place]. Anterior: Unremarkable Achilles: A unremarkable, partially obscured distally. Plantar Fascia: Unremarkable LIGAMENTS Lateral: The inferior tibiofibular ligaments appear intact. Abnormally attenuated anterior talofibular ligament with surrounding inflammatory findings, images 14-16 series 6, likely from a prior tear, anterolateral impingement not excluded. Calcaneofibular ligament not visible, completely obscured. Medial: Thickened superomedial portion of the spring ligament. Inferior components of the spring ligament are thought to be intact. Deltoid ligament seems  intact. CARTILAGE Ankle Joint: No significant osteochondral lesion observed. Subtalar Joints/Sinus Tarsi: There is low-level edema in the talus along the posterior subtalar joint with degenerative findings such as a small degenerative subcortical cyst on image 11/4. Low-level edema in the adjacent sinus tarsi, although less than is typically seen in sinus tarsi syndrome. Part of the talus is obscured by metal artifact in the calcaneus. Bones: There is thought to be loss of height of the calcaneus related to the prior fracture, although the calcaneus is essentially completely obscured by the metal artifact. No findings of septic joint. Post-contrast images are severely degraded by motion and field heterogeneity, and are not considered reliable for assessment of enhancement. IMPRESSION: IMPRESSION 1. No compelling findings of septic joint. Trace tibiotalar joint effusion but without compelling synovitis. 2. Edema in the talus is felt to be highly likely to be due to degenerative subtalar arthropathy rather than osteomyelitis. 3. Severely degraded images by both motion artifact and metal artifact from the calcaneal hardware. 4. Torn anterior talofibular ligament with local inflammatory findings reason the possibility of anterolateral impingement. 5. Ancillary relatively minor findings including mild tenosynovitis of the flexor hallucis longus and thickening of the superomedial portion of the spring ligament. These discussions are primarily in accord with the preliminary report issued by Dr. CRadene Kneeon 02/09/2016. The only potentially significant additional finding is the apparent tear of the ATFL and possibility of anterolateral impingement. Electronically Signed   By: WVan ClinesM.D.   On: 02/10/2016 11:52    Shakia Sebastiano, DO  [facility] Pager [phone] If 7PM-7AM, please contact night-coverage [email or web address] Password [email or web address] 02/11/2016, 12:41 PM   LOS: 1 day

## 2016-02-11 NOTE — Progress Notes (Signed)
Late entry- 0400-pt demanding that MD be called.  Pt wants to speak directly  with MD about his pain medication.  Patient wants doses of pain meds changed.  He says that on previous admissions to hospital, he has gotten 10mg  Oxycodone and 2mg  IV Dilaudid.  Paged Lenny Pastelom Callahan, on call for [facility].  Included patient's room phone number in page. Received return call from Lenny Pastelom Callahan.  Received order for one time additional dose of 0.5mg  Dilaudid which was administered to patient.

## 2016-02-11 NOTE — Progress Notes (Signed)
PHARMACY - PHYSICIAN COMMUNICATION CRITICAL VALUE ALERT - BLOOD CULTURE IDENTIFICATION (BCID)  Results for orders placed or performed during the hospital encounter of 02/09/16  Blood Culture ID Panel (Reflexed) (Collected: 02/10/2016 12:30 AM)  Result Value Ref Range   Enterococcus species NOT DETECTED NOT DETECTED   Vancomycin resistance NOT DETECTED NOT DETECTED   Listeria monocytogenes NOT DETECTED NOT DETECTED   Staphylococcus species NOT DETECTED NOT DETECTED   Staphylococcus aureus NOT DETECTED NOT DETECTED   Methicillin resistance NOT DETECTED NOT DETECTED   Streptococcus species NOT DETECTED NOT DETECTED   Streptococcus agalactiae NOT DETECTED NOT DETECTED   Streptococcus pneumoniae NOT DETECTED NOT DETECTED   Streptococcus pyogenes NOT DETECTED NOT DETECTED   Acinetobacter baumannii NOT DETECTED NOT DETECTED   Enterobacteriaceae species NOT DETECTED NOT DETECTED   Enterobacter cloacae complex NOT DETECTED NOT DETECTED   Escherichia coli NOT DETECTED NOT DETECTED   Klebsiella oxytoca NOT DETECTED NOT DETECTED   Klebsiella pneumoniae NOT DETECTED NOT DETECTED   Proteus species NOT DETECTED NOT DETECTED   Serratia marcescens NOT DETECTED NOT DETECTED   Carbapenem resistance NOT DETECTED NOT DETECTED   Haemophilus influenzae NOT DETECTED NOT DETECTED   Neisseria meningitidis NOT DETECTED NOT DETECTED   Pseudomonas aeruginosa NOT DETECTED NOT DETECTED   Candida albicans NOT DETECTED NOT DETECTED   Candida glabrata NOT DETECTED NOT DETECTED   Candida krusei NOT DETECTED NOT DETECTED   Candida parapsilosis NOT DETECTED NOT DETECTED   Candida tropicalis NOT DETECTED NOT DETECTED    Name of physician (or Provider) Contacted: Dr. Arbutus Leasat  Blood cx 1/2 with GPRs. Lab noted they look like diptheroids. Most likely contaminant. ID following as well.  Plan: Continue cefazolin for MSSA bacteremia  Enzo BiNathan Kelvin Sennett, PharmD, BCPS Clinical Pharmacist Pager [phone] 02/11/2016 1:13  PM

## 2016-02-11 NOTE — Progress Notes (Signed)
[facility]   Reason for visit: Follow up on MSSA bacteremia, ankle swelling  Interval History: demanding pain medications, worried about swelling and worried about bacteremia.  Afebrile. Associated pain in ankle. No diarrhea.   Physical Exam: Constitutional:  Filed Vitals:   02/10/16 2132 02/11/16 0427  BP: 139/79 134/85  Pulse: 117 108  Temp: 101.6 F (38.7 C) 100 F (37.8 C)  Resp: 19 18   patient appears in NAD, pleasant Respiratory: Normal respiratory effort; CTA B Cardiovascular: RRR GI: soft, nt  Review of Systems: Constitutional: negative for fevers and chills Gastrointestinal: negative for diarrhea Musculoskeletal: positive for myalgias and arthralgias, negative for muscle weakness  Lab Results  Component Value Date   WBC 15.1* 02/10/2016   HGB 15.6 02/10/2016   HCT 46.5 02/10/2016   MCV 91.0 02/10/2016   PLT 232 02/10/2016    Lab Results  Component Value Date   CREATININE 0.89 02/10/2016   BUN 10 02/10/2016   NA 132* 02/10/2016   K 4.4 02/10/2016   CL 100* 02/10/2016   CO2 22 02/10/2016    Lab Results  Component Value Date   ALT 16* 02/17/2015   AST 16 02/17/2015   ALKPHOS 108 02/17/2015     Microbiology: Recent Results (from the past 240 hour(s))  Blood culture (routine x 2)     Status: Abnormal (Preliminary result)   Collection Time: 02/08/16  7:30 PM  Result Value Ref Range Status   Specimen Description BLOOD LEFT UPPER ARM  Final   Special Requests   Final    BOTTLES DRAWN AEROBIC AND ANAEROBIC AER 10cc ANA 10cc   Culture  Setup Time   Final    GRAM POSITIVE COCCI IN CLUSTERS ANAEROBIC BOTTLE ONLY CRITICAL RESULT CALLED TO, READ BACK BY AND VERIFIED WITH: J LEDFORD,PHARMD AT 0300 02/10/16 BY M KELLY    Culture STAPHYLOCOCCUS AUREUS (A)  Final   Report Status PENDING  Incomplete   Organism ID, Bacteria STAPHYLOCOCCUS AUREUS  Final      Susceptibility   Staphylococcus aureus - MIC*    CIPROFLOXACIN <=0.5  SENSITIVE Sensitive     ERYTHROMYCIN <=0.25 SENSITIVE Sensitive     GENTAMICIN <=0.5 SENSITIVE Sensitive     OXACILLIN 0.5 SENSITIVE Sensitive     TETRACYCLINE <=1 SENSITIVE Sensitive     VANCOMYCIN <=0.5 SENSITIVE Sensitive     TRIMETH/SULFA <=10 SENSITIVE Sensitive     CLINDAMYCIN <=0.25 SENSITIVE Sensitive     RIFAMPIN <=0.5 SENSITIVE Sensitive     Inducible Clindamycin Value in next row Sensitive      [facility]     * STAPHYLOCOCCUS AUREUS  Blood Culture ID Panel (Reflexed)     Status: Abnormal   Collection Time: 02/08/16  7:30 PM  Result Value Ref Range Status   Enterococcus species NOT DETECTED NOT DETECTED Final   Vancomycin resistance NOT DETECTED NOT DETECTED Final   Listeria monocytogenes NOT DETECTED NOT DETECTED Final   Staphylococcus species DETECTED (A) NOT DETECTED Final    Comment: JAMES LEDFORD,PHARMD@0300  02/10/16 MKELLY   Staphylococcus aureus DETECTED (A) NOT DETECTED Final    Comment: JAMES LEDFORD,PHARMD@0300  02/10/16 MKELLY   Methicillin resistance NOT DETECTED NOT DETECTED Final   Streptococcus species NOT DETECTED NOT DETECTED Final   Streptococcus agalactiae NOT DETECTED NOT DETECTED Final   Streptococcus pneumoniae NOT DETECTED NOT DETECTED Final   Streptococcus pyogenes NOT DETECTED NOT DETECTED Final   Acinetobacter baumannii NOT DETECTED NOT DETECTED Final   Enterobacteriaceae  species NOT DETECTED NOT DETECTED Final   Enterobacter cloacae complex NOT DETECTED NOT DETECTED Final   Escherichia coli NOT DETECTED NOT DETECTED Final   Klebsiella oxytoca NOT DETECTED NOT DETECTED Final   Klebsiella pneumoniae NOT DETECTED NOT DETECTED Final   Proteus species NOT DETECTED NOT DETECTED Final   Serratia marcescens NOT DETECTED NOT DETECTED Final   Carbapenem resistance NOT DETECTED NOT DETECTED Final   Haemophilus influenzae NOT DETECTED NOT DETECTED Final   Neisseria meningitidis NOT DETECTED NOT DETECTED Final   Pseudomonas  aeruginosa NOT DETECTED NOT DETECTED Final   Candida albicans NOT DETECTED NOT DETECTED Final   Candida glabrata NOT DETECTED NOT DETECTED Final   Candida krusei NOT DETECTED NOT DETECTED Final   Candida parapsilosis NOT DETECTED NOT DETECTED Final   Candida tropicalis NOT DETECTED NOT DETECTED Final  Blood culture (routine x 2)     Status: None (Preliminary result)   Collection Time: 02/08/16  7:40 PM  Result Value Ref Range Status   Specimen Description BLOOD LEFT FOREARM  Final   Special Requests   Final    BOTTLES DRAWN AEROBIC AND ANAEROBIC AER 10cc ANA 10cc   Culture   Final    NO GROWTH 2 DAYS Performed at [facility]    Report Status PENDING  Incomplete    Impression/Plan:  1. MSSA bacteremia - will need TEE which will help determine duration.  I will order TTE as well.   2. Ankle - swelling but no obvious issues on MRI.  May be from the torn ligament.   3. dispo - after leaving AMA with IV, would not send out with picc line.

## 2016-02-12 ENCOUNTER — Encounter (HOSPITAL_COMMUNITY): Payer: Self-pay

## 2016-02-12 ENCOUNTER — Inpatient Hospital Stay (HOSPITAL_COMMUNITY): Payer: Medicaid Other

## 2016-02-12 ENCOUNTER — Encounter (HOSPITAL_COMMUNITY)
Admission: EM | Disposition: A | Discharge status: Home / Self Care | Payer: Self-pay | Source: Home / Self Care | Attending: Internal Medicine

## 2016-02-12 DIAGNOSIS — R7881 Bacteremia: Secondary | ICD-10-CM

## 2016-02-12 LAB — ECHOCARDIOGRAM COMPLETE
E decel time: 282 msec
E/e' ratio: 6.67
FS: 14 % — AB (ref 28–44)
HEIGHTINCHES: 75 in
IVS/LV PW RATIO, ED: 0.77
LA ID, A-P, ES: 30 mm
LA diam end sys: 30 mm
LADIAMINDEX: 1.4 cm/m2
LAVOL: 41.3 mL
LAVOLA4C: 30.4 mL
LAVOLIN: 19.2 mL/m2
LDCA: 3.8 cm2
LV TDI E'LATERAL: 11.2
LV TDI E'MEDIAL: 10.7
LVEEAVG: 6.67
LVEEMED: 6.67
LVELAT: 11.2 cm/s
LVOT diameter: 22 mm
MV Dec: 282
MV Peak grad: 2 mmHg
MVPKAVEL: 74.2 m/s
MVPKEVEL: 74.7 m/s
PW: 10.9 mm — AB (ref 0.6–1.1)
WEIGHTICAEL: 3040 [oz_av]

## 2016-02-12 LAB — CULTURE, BLOOD (ROUTINE X 2)

## 2016-02-12 LAB — CBC WITH DIFFERENTIAL/PLATELET
BASOS PCT: 0 %
Basophils Absolute: 0 10*3/uL (ref 0.0–0.1)
EOS ABS: 0.4 10*3/uL (ref 0.0–0.7)
Eosinophils Relative: 3 %
HCT: 43.1 % (ref 39.0–52.0)
HEMOGLOBIN: 14.4 g/dL (ref 13.0–17.0)
Lymphocytes Relative: 16 %
Lymphs Abs: 1.7 10*3/uL (ref 0.7–4.0)
MCH: 30.3 pg (ref 26.0–34.0)
MCHC: 33.4 g/dL (ref 30.0–36.0)
MCV: 90.5 fL (ref 78.0–100.0)
MONOS PCT: 10 %
Monocytes Absolute: 1.1 10*3/uL — ABNORMAL HIGH (ref 0.1–1.0)
NEUTROS PCT: 71 %
Neutro Abs: 7.5 10*3/uL (ref 1.7–7.7)
Platelets: 320 10*3/uL (ref 150–400)
RBC: 4.76 MIL/uL (ref 4.22–5.81)
RDW: 12.1 % (ref 11.5–15.5)
WBC: 10.6 10*3/uL — AB (ref 4.0–10.5)

## 2016-02-12 LAB — BASIC METABOLIC PANEL
Anion gap: 10 (ref 5–15)
BUN: 9 mg/dL (ref 6–20)
CALCIUM: 9.5 mg/dL (ref 8.9–10.3)
CO2: 29 mmol/L (ref 22–32)
CREATININE: 0.81 mg/dL (ref 0.61–1.24)
Chloride: 98 mmol/L — ABNORMAL LOW (ref 101–111)
GFR calc non Af Amer: >60 mL/min (ref >60)
Glucose, Bld: 96 mg/dL (ref 65–99)
Potassium: 4 mmol/L (ref 3.5–5.1)
SODIUM: 137 mmol/L (ref 135–145)

## 2016-02-12 SURGERY — CANCELLED PROCEDURE

## 2016-02-12 MED ORDER — HYDROMORPHONE HCL 1 MG/ML IJ SOLN
1.0000 mg | Freq: Once | INTRAMUSCULAR | Status: AC
  Administered 2016-02-12: 1 mg via INTRAVENOUS
  Filled 2016-02-12: qty 1

## 2016-02-12 MED ORDER — FENTANYL CITRATE (PF) 100 MCG/2ML IJ SOLN
INTRAMUSCULAR | Status: AC
  Filled 2016-02-12: qty 2

## 2016-02-12 MED ORDER — HYDROMORPHONE HCL 1 MG/ML IJ SOLN
INTRAMUSCULAR | Status: AC
  Filled 2016-02-12: qty 1

## 2016-02-12 MED ORDER — MIDAZOLAM HCL 5 MG/ML IJ SOLN
INTRAMUSCULAR | Status: AC
  Filled 2016-02-12: qty 2

## 2016-02-12 MED ORDER — DIPHENHYDRAMINE HCL 50 MG/ML IJ SOLN
INTRAMUSCULAR | Status: AC
  Filled 2016-02-12: qty 1

## 2016-02-12 MED ORDER — SODIUM CHLORIDE 0.9 % IV SOLN: INTRAVENOUS | Status: DC

## 2016-02-12 MED ORDER — EPINEPHRINE HCL 0.1 MG/ML IJ SOSY
PREFILLED_SYRINGE | INTRAMUSCULAR | Status: AC
  Filled 2016-02-12: qty 10

## 2016-02-12 MED ORDER — CEFAZOLIN SODIUM-DEXTROSE 2-4 GM/100ML-% IV SOLN
2.0000 g | Freq: Three times a day (TID) | INTRAVENOUS | Status: DC
  Administered 2016-02-13 – 2016-02-14 (×5): 2 g via INTRAVENOUS
  Filled 2016-02-12 (×7): qty 100

## 2016-02-12 NOTE — Clinical Social Work Note (Signed)
Clinical Social Work Assessment  Patient Details  Name: Kenneth Charles MRN: [id number] Date of Birth: November 14, 1980  Date of referral:  02/12/16               Reason for consult:  Substance Use/ETOH Abuse                Permission sought to share information with:  Facility Medical sales representativeContact Representative, Family Supports Permission granted to share information::  Yes, Verbal Permission Granted  Name::        Agency::  SNFs (if the patient needs SNF he states he is agreeable)  Relationship::     Contact Information:     Housing/Transportation Living arrangements for the past 2 months:  Single Family Home Source of Information:  Patient Patient Interpreter Needed:  None Criminal Activity/Legal Involvement Pertinent to Current Situation/Hospitalization:  No - Comment as needed Significant Relationships:  Significant Other, Parents Lives with:  Significant Other Do you feel safe going back to the place where you live?  Yes Need for family participation in patient care:  No (Coment)  Care giving concerns:  The patient does not list any care giving concerns at this time. His main concerns seems to be about his Medicaid. CSW has financial counseling to ask for assistance with having this addressed with the patient.    Social Worker assessment / plan:  CSW was asked to meet with patient regarding his substance use. The patient is a 35 yo [place] that presents with a substance abuse history significant for cocaine use, opiate use, and marijuana use. The patient's mental health history is significant for anxiety. The patient shares that he has most recently been using cocaine and marijuana. His he states that his last cocaine use was "a week ago." CSW inquired about the patient's past IV heroin use. The patient states that he has not used heroin in approximately 8 months. He attributes his abstinence from heroin to Suboxone treatment. The patient shared that he does really feel that he has a  substance abuse problem but was receptive to list of resources provided by CSW. The patient denies any alcohol use. The patient does share that he had to go to SNF after his last hospitalization due to needing IV antibiotics. The patient [facility] and is agreeable to going back to a SNF if this is needed (unit CSW updated). SBIRT completed with patient.  Employment status:  Unemployed Health and safety inspectornsurance information:  Self Pay (Medicaid Pending) (Claims he has Medicaid but is listed as Medicaid potential ) PT Recommendations:  Not assessed at this time Information / Referral to community resources:  Other (Comment Required), SBIRT, Outpatient Substance Abuse Treatment Options, Residential Substance Abuse Treatment Options (Patient accepting of resources. He agrees to SNF placement if this is needed for IV antibiotics. [facility] the past. )  Patient/Family's Response to care:  The patient is appreciative of the care he has received and is appreciative of CSW assistance.   Patient/Family's Understanding of and Emotional Response to Diagnosis, Current Treatment, and Prognosis:  The patient appears to have a good understanding of the reason for his admission and his post DC needs. He remains realistic about his care plan and understands that SNF may be needed if IV antibiotics are needed at time of discharge.  Emotional Assessment Appearance:  Appears stated age Attitude/Demeanor/Rapport:  Other (The patient was appropriate and welcoming of CSW.) Affect (typically observed):  Accepting, Calm, Pleasant, Appropriate Orientation:  Oriented to Self, Oriented to Place, Oriented to  Time, Oriented to Situation Alcohol / Substance use:  Illicit Drugs (opiates, THC, cocaine) Psych involvement (Current and /or in the community):  No (Comment)  Discharge Needs  Concerns to be addressed:  No discharge needs identified Readmission within the last 30  days:  No Current discharge risk:  Substance Abuse Barriers to Discharge:  Continued Medical Work up   Andi Hence B, LCSW 02/12/2016, 11:43 AM

## 2016-02-12 NOTE — Progress Notes (Signed)
Subjective:   Procedure(s) (LRB): TRANSESOPHAGEAL ECHOCARDIOGRAM (TEE) (N/A) Patient reports pain as 4 on 0-10 scale. I recommended a Continuous heating Pad around his ankle and thus far it has not been applied, He is beginning to localize this cellulitis to the old incision site. Thus far no signs of a Abscess. Hopefully this will localize in order to do an I&D later. Will repeat a MRI if this occurs.   Temp.99.Repeat WBC.  Objective: Vital signs in last 24 hours: Temp:  [98.9 F (37.2 C)-101.1 F (38.4 C)] 99 F (37.2 C) (06/13 0512) Pulse Rate:  [97-113] 101 (06/13 0512) Resp:  [18] 18 (06/13 0512) BP: (117-155)/(65-82) 130/82 mmHg (06/13 0512) SpO2:  [95 %-99 %] 96 % (06/13 0512)  Intake/Output from previous day: 06/12 0701 - 06/13 0700 In: 1400 [P.O.:1400] Out: 550 [Urine:550] Intake/Output this shift:     Recent Labs  02/09/16 1821 02/10/16 1154  HGB 15.0 15.6    Recent Labs  02/09/16 1821 02/10/16 1154  WBC 12.8* 15.1*  RBC 4.88 5.11  HCT 44.9 46.5  PLT 289 232    Recent Labs  02/09/16 1821 02/10/16 1154  NA 137 132*  K 4.0 4.4  CL 104 100*  CO2 25 22  BUN 8 10  CREATININE 0.95 0.89  GLUCOSE 90 109*  CALCIUM 9.0 8.9   No results for input(s): LABPT, INR in the last 72 hours.  Compartment soft Cellulitis of Lateral Ankle.on the Left  Assessment/Plan:   Procedure(s) (LRB): TRANSESOPHAGEAL ECHOCARDIOGRAM (TEE) (N/A) Up with therapy.Repeat WBC.  Abigail Marsiglia A 02/12/2016, 7:28 AM

## 2016-02-12 NOTE — Progress Notes (Signed)
  Echocardiogram 2D Echocardiogram has been performed.  Kenneth BeamsGREGORY, Kenneth Charles 02/12/2016, 9:23 AM

## 2016-02-12 NOTE — Progress Notes (Addendum)
Patient very insistent to have additional Dilaudid dose for left ankle pain.  PRN medication Dilaudid 1mg  given at 2151, Oxy IR 5mg  tab at 2230 and scheduled Oxycontin 30mg  tab given at 2152.  Floor coverage MD paged. Awaiting response.

## 2016-02-12 NOTE — Progress Notes (Signed)
PROGRESS NOTE    Kenneth Charles  DPO:[id number]  DOB: March 28, 1981  DOA: 02/09/2016 PCP: Ricke Hey, MD Outpatient Specialists:   Hospital course: 35 year old male with a history of polysubstance abuse presents with 3-4 day history of increasing pain and edema of his left ankle. The patient denies any recent injury. The patient visited the emergency department on 02/08/2016. CT of the ankle was ordered at that time, but the patient eloped with his IV intact. He returned on 02/09/2016 with increasing pain and edema. He states that he last snorted cocaine about 1 week prior to admission. Last injected heroin 7-8 months ago. X-ray of his left ankle on 02/08/2016 showed increasing soft tissue opacity posterior to left ankle.  Assessment/Plan: Cellulitis left foot -MRI results--no compelling findings of septic joint, trace tibial talar joint effusion, torn talofibular ligament with inflammatory changes, mild tenosynovitis flexor hallucis longus tendon -Patient has dismissed Dr. Sharol Given from his care -appreciate Dr. Gladstone Lighter consult -continue IV abx per ID recs -ESR 4 -CRP 8.2 -Pt still having fevers  MSSA bacteremia -02/08/16--blood culture MSSA -vancomycin-->cefazolin (started 02/10/16) -consult cardiology for TEE on 6/13.   Chronic pain/opioid seeking behavior -pt on oxycodone 10m tid prior to admission -continue hydromorphone 1 mg q 3 hours prn pain -pt refusing blood draws because "it took 30 sticks" to get any blood  Polysubstance abuse -last snorted cocaine one week prior to admit -pt states he last used heroin 7-8 months prior to admit, but police reported pt was actively using when he left [facility] on 6/9 -UDS--positive THC and opioids  Tobacco abuse -nicoderm patch -cessation discussed   Disposition Plan: Home  Family Communication: Girlfriend updated at beside  Consultants: Ortho--Dudaand Gioffre  Code Status:  FULL  Consultants:  ID  Orthopedics  Cardiology for TEE  Procedures:  TEE scheduled for 6/13  Antimicrobials: Anti-infectives    Start     Dose/Rate Route Frequency Ordered Stop   02/10/16 1500  ceFAZolin (ANCEF) IVPB 2g/100 mL premix     2 g 200 mL/hr over 30 Minutes Intravenous Every 8 hours 02/10/16 1404     02/10/16 1500  rifampin (RIFADIN) capsule 300 mg     300 mg Oral Every 12 hours 02/10/16 1404     02/10/16 1200  piperacillin-tazobactam (ZOSYN) IVPB 3.375 g  Status:  Discontinued     3.375 g 12.5 mL/hr over 240 Minutes Intravenous Every 8 hours 02/10/16 0429 02/10/16 1404   02/10/16 1000  vancomycin (VANCOCIN) 1,250 mg in sodium chloride 0.9 % 250 mL IVPB  Status:  Discontinued     1,250 mg 166.7 mL/hr over 90 Minutes Intravenous Every 8 hours 02/10/16 0054 02/10/16 1404   02/10/16 0800  piperacillin-tazobactam (ZOSYN) IVPB 3.375 g  Status:  Discontinued     3.375 g 12.5 mL/hr over 240 Minutes Intravenous Every 8 hours 02/10/16 0054 02/10/16 0429   02/09/16 2345  piperacillin-tazobactam (ZOSYN) IVPB 3.375 g     3.375 g 100 mL/hr over 30 Minutes Intravenous  Once 02/09/16 2343 02/10/16 0518   02/09/16 2345  vancomycin (VANCOCIN) IVPB 1000 mg/200 mL premix  Status:  Discontinued     1,000 mg 200 mL/hr over 60 Minutes Intravenous  Once 02/09/16 2343 02/09/16 2343   02/09/16 2345  vancomycin (VANCOCIN) 1,500 mg in sodium chloride 0.9 % 500 mL IVPB     1,500 mg 250 mL/hr over 120 Minutes Intravenous  Once 02/09/16 2344 02/10/16 0403      Subjective: Pt anxious about having the TEE but is  keeping ankle elevated  Objective: Filed Vitals:   02/11/16 1431 02/11/16 2118 02/12/16 0140 02/12/16 0512  BP: 117/65 155/80  130/82  Pulse: 97 113  101  Temp: 99 F (37.2 C) 101.1 F (38.4 C) 98.9 F (37.2 C) 99 F (37.2 C)  TempSrc: Oral Oral Oral Oral  Resp:  18  18  Height:      Weight:      SpO2: 99% 95%  96%    Intake/Output Summary (Last 24 hours) at 02/12/16  1130 Last data filed at 02/11/16 2100  Gross per 24 hour  Intake   1160 ml  Output    300 ml  Net    860 ml   Filed Weights   02/09/16 1238  Weight: 190 lb (86.183 kg)   Exam:   General: Pt is alert, follows commands appropriately, not in acute distress  HEENT: No icterus, No thrush, No neck mass, [place]/AT  Cardiovascular: RRR, S1/S2, no rubs, no gallops  Respiratory: CTA bilaterally, no wheezing, no crackles, no rhonchi  Abdomen: Soft/+BS, non tender, non distended, no guarding  Extremities: No edema, No lymphangitis, No petechiae, No rashes; Right ankle with localized erythema and ecchymosis on the lateral malleolar area. No crepitance. No draining wounds.  Data Reviewed: Basic Metabolic Panel:  Recent Labs Lab 02/08/16 1930 02/09/16 1821 02/10/16 1154  NA 137 137 132*  K 3.7 4.0 4.4  CL 103 104 100*  CO2 _0 GLUCOSE 119* 90 109*  BUN _1 CREATININE 0.96 0.95 0.89  CALCIUM 8.9 9.0 8.9   Liver Function Tests: No results for input(s): AST, ALT, ALKPHOS, BILITOT, PROT, ALBUMIN in the last 168 hours. No results for input(s): LIPASE, AMYLASE in the last 168 hours. No results for input(s): AMMONIA in the last 168 hours. CBC:  Recent Labs Lab 02/08/16 1930 02/09/16 1821 02/10/16 1154  WBC 11.4* 12.8* 15.1*  NEUTROABS 8.3* 8.9*  --   HGB 14.9 15.0 15.6  HCT 43.6 44.9 46.5  MCV 92.2 92.0 91.0  PLT 287 289 232   Cardiac Enzymes: No results for input(s): CKTOTAL, CKMB, CKMBINDEX, TROPONINI in the last 168 hours. BNP (last 3 results) No results for input(s): PROBNP in the last 8760 hours. CBG: No results for input(s): GLUCAP in the last 168 hours.  Recent Results (from the past 240 hour(s))  Blood culture (routine x 2)     Status: Abnormal (Preliminary result)   Collection Time: 02/08/16  7:30 PM  Result Value Ref Range Status   Specimen Description BLOOD LEFT UPPER ARM  Final   Special Requests   Final    BOTTLES DRAWN AEROBIC AND ANAEROBIC  AER 10cc ANA 10cc   Culture  Setup Time   Final    GRAM POSITIVE COCCI IN CLUSTERS ANAEROBIC BOTTLE ONLY CRITICAL RESULT CALLED TO, READ BACK BY AND VERIFIED WITH: J LEDFORD,PHARMD AT 0300 02/10/16 BY M KELLY    Culture STAPHYLOCOCCUS AUREUS (A)  Final   Report Status PENDING  Incomplete   Organism ID, Bacteria STAPHYLOCOCCUS AUREUS  Final      Susceptibility   Staphylococcus aureus - MIC*    CIPROFLOXACIN <=0.5 SENSITIVE Sensitive     ERYTHROMYCIN <=0.25 SENSITIVE Sensitive     GENTAMICIN <=0.5 SENSITIVE Sensitive     OXACILLIN 0.5 SENSITIVE Sensitive     TETRACYCLINE <=1 SENSITIVE Sensitive     VANCOMYCIN <=0.5 SENSITIVE Sensitive     TRIMETH/SULFA <=10 SENSITIVE Sensitive     CLINDAMYCIN <=0.25 SENSITIVE Sensitive  RIFAMPIN <=0.5 SENSITIVE Sensitive     Inducible Clindamycin Value in next row Sensitive      NEGATIVEPerformed at [place]  Blood Culture ID Panel (Reflexed)     Status: Abnormal   Collection Time: 02/08/16  7:30 PM  Result Value Ref Range Status   Enterococcus species NOT DETECTED NOT DETECTED Final   Vancomycin resistance NOT DETECTED NOT DETECTED Final   Listeria monocytogenes NOT DETECTED NOT DETECTED Final   Staphylococcus species DETECTED (A) NOT DETECTED Final    Comment: JAMES LEDFORD,[email or web address]_0  02/10/16 MKELLY   Staphylococcus aureus DETECTED (A) NOT DETECTED Final    Comment: JAMES LEDFORD,PHARMD_1  02/10/16 MKELLY   Methicillin resistance NOT DETECTED NOT DETECTED Final   Streptococcus species NOT DETECTED NOT DETECTED Final   Streptococcus agalactiae NOT DETECTED NOT DETECTED Final   Streptococcus pneumoniae NOT DETECTED NOT DETECTED Final   Streptococcus pyogenes NOT DETECTED NOT DETECTED Final   Acinetobacter baumannii NOT DETECTED NOT DETECTED Final   Enterobacteriaceae species NOT DETECTED NOT DETECTED Final   Enterobacter cloacae complex NOT DETECTED NOT DETECTED Final   Escherichia coli NOT DETECTED  NOT DETECTED Final   Klebsiella oxytoca NOT DETECTED NOT DETECTED Final   Klebsiella pneumoniae NOT DETECTED NOT DETECTED Final   Proteus species NOT DETECTED NOT DETECTED Final   Serratia marcescens NOT DETECTED NOT DETECTED Final   Carbapenem resistance NOT DETECTED NOT DETECTED Final   Haemophilus influenzae NOT DETECTED NOT DETECTED Final   Neisseria meningitidis NOT DETECTED NOT DETECTED Final   Pseudomonas aeruginosa NOT DETECTED NOT DETECTED Final   Candida albicans NOT DETECTED NOT DETECTED Final   Candida glabrata NOT DETECTED NOT DETECTED Final   Candida krusei NOT DETECTED NOT DETECTED Final   Candida parapsilosis NOT DETECTED NOT DETECTED Final   Candida tropicalis NOT DETECTED NOT DETECTED Final  Blood culture (routine x 2)     Status: None (Preliminary result)   Collection Time: 02/08/16  7:40 PM  Result Value Ref Range Status   Specimen Description BLOOD LEFT FOREARM  Final   Special Requests   Final    BOTTLES DRAWN AEROBIC AND ANAEROBIC AER 10cc ANA 10cc   Culture   Final    NO GROWTH 3 DAYS Performed at [facility]    Report Status PENDING  Incomplete  Blood culture (routine x 2)     Status: None (Preliminary result)   Collection Time: 02/10/16 12:30 AM  Result Value Ref Range Status   Specimen Description BLOOD RIGHT HAND  Final   Special Requests AEROBIC BOTTLE ONLY 5ML  Final   Culture  Setup Time   Final    GRAM POSITIVE RODS CRITICAL RESULT CALLED TO, READ BACK BY AND VERIFIED WITH: N. Batchelder Pharm.D. 12:50 02/11/16 ([email or web address]) AEROBIC BOTTLE ONLY Organism ID to follow    Culture NO GROWTH 1 DAY  Final   Report Status PENDING  Incomplete  Blood Culture ID Panel (Reflexed)     Status: None   Collection Time: 02/10/16 12:30 AM  Result Value Ref Range Status   Enterococcus species NOT DETECTED NOT DETECTED Final   Vancomycin resistance NOT DETECTED NOT DETECTED Final   Listeria monocytogenes NOT DETECTED NOT DETECTED Final   Staphylococcus  species NOT DETECTED NOT DETECTED Final   Staphylococcus aureus NOT DETECTED NOT DETECTED Final   Methicillin resistance NOT DETECTED NOT DETECTED Final   Streptococcus species NOT DETECTED NOT DETECTED Final   Streptococcus agalactiae NOT DETECTED NOT DETECTED Final  Streptococcus pneumoniae NOT DETECTED NOT DETECTED Final   Streptococcus pyogenes NOT DETECTED NOT DETECTED Final   Acinetobacter baumannii NOT DETECTED NOT DETECTED Final   Enterobacteriaceae species NOT DETECTED NOT DETECTED Final   Enterobacter cloacae complex NOT DETECTED NOT DETECTED Final   Escherichia coli NOT DETECTED NOT DETECTED Final   Klebsiella oxytoca NOT DETECTED NOT DETECTED Final   Klebsiella pneumoniae NOT DETECTED NOT DETECTED Final   Proteus species NOT DETECTED NOT DETECTED Final   Serratia marcescens NOT DETECTED NOT DETECTED Final   Carbapenem resistance NOT DETECTED NOT DETECTED Final   Haemophilus influenzae NOT DETECTED NOT DETECTED Final   Neisseria meningitidis NOT DETECTED NOT DETECTED Final   Pseudomonas aeruginosa NOT DETECTED NOT DETECTED Final   Candida albicans NOT DETECTED NOT DETECTED Final   Candida glabrata NOT DETECTED NOT DETECTED Final   Candida krusei NOT DETECTED NOT DETECTED Final   Candida parapsilosis NOT DETECTED NOT DETECTED Final   Candida tropicalis NOT DETECTED NOT DETECTED Final  Blood culture (routine x 2)     Status: None (Preliminary result)   Collection Time: 02/10/16 12:40 AM  Result Value Ref Range Status   Specimen Description BLOOD LEFT HAND  Final   Special Requests BOTTLES DRAWN AEROBIC AND ANAEROBIC 5ML  Final   Culture NO GROWTH 1 DAY  Final   Report Status PENDING  Incomplete     Studies: No results found.   Scheduled Meds: .  ceFAZolin (ANCEF) IV  2 g Intravenous Q8H  . enoxaparin (LOVENOX) injection  40 mg Subcutaneous Q24H  . fentaNYL (SUBLIMAZE) injection  50 mcg Intravenous Once  . nicotine  21 mg Transdermal Daily  . oxyCODONE  30 mg Oral  Q12H  . rifampin  300 mg Oral Q12H   Continuous Infusions:   Active Problems:   Septic arthritis ([place])   Cocaine abuse   Tobacco use disorder   Opioid dependence ([place])   Staphylococcus aureus bacteremia   Ankle swelling   Irwin Brakeman, MD, FAAFP [facility] Pager [phone]  If 7PM-7AM, please contact night-coverage [email or web address] Password [email or web address] 02/12/2016, 11:30 AM    LOS: 2 days

## 2016-02-13 ENCOUNTER — Inpatient Hospital Stay (HOSPITAL_COMMUNITY): Payer: Medicaid Other | Admitting: Certified Registered Nurse Anesthetist

## 2016-02-13 ENCOUNTER — Inpatient Hospital Stay (HOSPITAL_COMMUNITY): Payer: Medicaid Other

## 2016-02-13 ENCOUNTER — Encounter (HOSPITAL_COMMUNITY): Payer: Self-pay | Admitting: Cardiovascular Disease

## 2016-02-13 ENCOUNTER — Encounter (HOSPITAL_COMMUNITY)
Admission: EM | Disposition: A | Discharge status: Home / Self Care | Payer: Self-pay | Source: Home / Self Care | Attending: Internal Medicine

## 2016-02-13 DIAGNOSIS — L03116 Cellulitis of left lower limb: Principal | ICD-10-CM

## 2016-02-13 DIAGNOSIS — F17200 Nicotine dependence, unspecified, uncomplicated: Secondary | ICD-10-CM

## 2016-02-13 DIAGNOSIS — F1410 Cocaine abuse, uncomplicated: Secondary | ICD-10-CM

## 2016-02-13 DIAGNOSIS — R7881 Bacteremia: Secondary | ICD-10-CM

## 2016-02-13 HISTORY — PX: TEE WITHOUT CARDIOVERSION: SHX5443

## 2016-02-13 LAB — CULTURE, BLOOD (ROUTINE X 2): CULTURE: NO GROWTH

## 2016-02-13 SURGERY — ECHOCARDIOGRAM, TRANSESOPHAGEAL
Anesthesia: Monitor Anesthesia Care

## 2016-02-13 MED ORDER — BUTAMBEN-TETRACAINE-BENZOCAINE 2-2-14 % EX AERO
INHALATION_SPRAY | CUTANEOUS | Status: DC | PRN
  Administered 2016-02-13: 2 via TOPICAL

## 2016-02-13 MED ORDER — FENTANYL CITRATE (PF) 100 MCG/2ML IJ SOLN: 25.0000 ug | INTRAMUSCULAR | Status: DC | PRN

## 2016-02-13 MED ORDER — ONDANSETRON HCL 4 MG/2ML IJ SOLN: 4.0000 mg | Freq: Once | INTRAMUSCULAR | Status: DC | PRN

## 2016-02-13 MED ORDER — SODIUM CHLORIDE 0.9 % IV SOLN
INTRAVENOUS | Status: DC
  Administered 2016-02-13: 500 mL via INTRAVENOUS

## 2016-02-13 MED ORDER — OXYCODONE HCL 5 MG PO TABS
5.0000 mg | ORAL_TABLET | Freq: Once | ORAL | Status: AC | PRN
  Administered 2016-02-13: 5 mg via ORAL

## 2016-02-13 MED ORDER — PROPOFOL 10 MG/ML IV BOLUS
INTRAVENOUS | Status: DC | PRN
  Administered 2016-02-13 (×2): 100 mg via INTRAVENOUS
  Administered 2016-02-13: 50 mg via INTRAVENOUS
  Administered 2016-02-13 (×2): 100 mg via INTRAVENOUS

## 2016-02-13 MED ORDER — SODIUM CHLORIDE 0.9 % IV SOLN
INTRAVENOUS | Status: DC | PRN
  Administered 2016-02-13: 13:00:00 via INTRAVENOUS

## 2016-02-13 MED ORDER — OXYCODONE HCL 5 MG/5ML PO SOLN: 5.0000 mg | Freq: Once | ORAL | Status: AC | PRN

## 2016-02-13 MED ORDER — IBUPROFEN 600 MG PO TABS
600.0000 mg | ORAL_TABLET | Freq: Four times a day (QID) | ORAL | Status: DC | PRN
  Filled 2016-02-13: qty 1

## 2016-02-13 NOTE — Progress Notes (Signed)
Patient with medicaid.  Linezolid (generic) does not require prior auth according to the 1 [phone] number Marlin CanaryJessica Yarah Fuente DO

## 2016-02-13 NOTE — Progress Notes (Signed)
Patient called this RN that he find a way to have the IV dilaudid go in the vein thru the same IV site by straightening his arm and insisted to get the PRN Dilaudid.  This RN inform patient that I test the blood return of the Iv site to be sure it is really going to the vein but patient refused and to go ahead and straighten his arm.  Administered PRN Dilaudid 1 mg around 0651.

## 2016-02-13 NOTE — Progress Notes (Signed)
PROGRESS NOTE    Kenneth Charles  ACZ:[id number]  DOB: 1980/12/21  DOA: 02/09/2016 PCP: Ricke Hey, MD Outpatient Specialists:   Hospital course: 35 year old male with a history of polysubstance abuse presents with 3-4 day history of increasing pain and edema of his left ankle. The patient denies any recent injury. The patient visited the emergency department on 02/08/2016. CT of the ankle was ordered at that time, but the patient eloped with his IV intact. He returned on 02/09/2016 with increasing pain and edema. He states that he last snorted cocaine about 1 week prior to admission. Last injected heroin 7-8 months ago. X-ray of his left ankle on 02/08/2016 showed increasing soft tissue opacity posterior to left ankle.  Assessment/Plan: Cellulitis left foot -MRI results--no compelling findings of septic joint, trace tibial talar joint effusion, torn talofibular ligament with inflammatory changes, mild tenosynovitis flexor hallucis longus tendon -Patient has dismissed Dr. Sharol Given from his care -appreciate Dr. Gladstone Lighter consult -continue IV abx-- if TEE negative= hope to be able to do PO abx for treatment at home -ESR 4 -CRP 8.2  MSSA bacteremia -02/08/16--blood culture MSSA -vancomycin-->cefazolin (started 02/10/16) -consult cardiology for TEE  Chronic pain/opioid seeking behavior -pt on oxycodone '10mg'$  tid prior to admission (although when [place] opioid database was inquired upon-- no prescriptions found) -continue hydromorphone 1 mg q 3 hours prn pain - not realistic to zero pain -pt refusing blood draws because "it took 30 sticks" to get any blood OxyContin 30 mg twice a day with PRN oxycodone -not sure who will prescribe pain meds at d/c for patient as I am not comfortable with history of abuse -ibuprofen  Polysubstance abuse -last snorted cocaine one week prior to admit -pt states he last used heroin 7-8 months prior to admit, but police reported pt was actively using  when he left AMA on 6/9 -UDS--positive THC and opioids  Tobacco abuse -nicoderm patch -cessation discussed   Disposition Plan: Home  Family Communication: no family at bedside   Code Status: FULL  Consultants:  ID  Orthopedics  Cardiology for TEE  Procedures:  TEE scheduled for 6/14  Antimicrobials: Anti-infectives    Start     Dose/Rate Route Frequency Ordered Stop   02/13/16 0229  ceFAZolin (ANCEF) IVPB 2g/100 mL premix     2 g 200 mL/hr over 30 Minutes Intravenous Every 8 hours 02/12/16 1837     02/10/16 1500  ceFAZolin (ANCEF) IVPB 2g/100 mL premix  Status:  Discontinued     2 g 200 mL/hr over 30 Minutes Intravenous Every 8 hours 02/10/16 1404 02/12/16 1837   02/10/16 1500  rifampin (RIFADIN) capsule 300 mg     300 mg Oral Every 12 hours 02/10/16 1404     02/10/16 1200  piperacillin-tazobactam (ZOSYN) IVPB 3.375 g  Status:  Discontinued     3.375 g 12.5 mL/hr over 240 Minutes Intravenous Every 8 hours 02/10/16 0429 02/10/16 1404   02/10/16 1000  vancomycin (VANCOCIN) 1,250 mg in sodium chloride 0.9 % 250 mL IVPB  Status:  Discontinued     1,250 mg 166.7 mL/hr over 90 Minutes Intravenous Every 8 hours 02/10/16 0054 02/10/16 1404   02/10/16 0800  piperacillin-tazobactam (ZOSYN) IVPB 3.375 g  Status:  Discontinued     3.375 g 12.5 mL/hr over 240 Minutes Intravenous Every 8 hours 02/10/16 0054 02/10/16 0429   02/09/16 2345  piperacillin-tazobactam (ZOSYN) IVPB 3.375 g     3.375 g 100 mL/hr over 30 Minutes Intravenous  Once 02/09/16 2343 02/10/16 0518  02/09/16 2345  vancomycin (VANCOCIN) IVPB 1000 mg/200 mL premix  Status:  Discontinued     1,000 mg 200 mL/hr over 60 Minutes Intravenous  Once 02/09/16 2343 02/09/16 2343   02/09/16 2345  vancomycin (VANCOCIN) 1,500 mg in sodium chloride 0.9 % 500 mL IVPB     1,500 mg 250 mL/hr over 120 Minutes Intravenous  Once 02/09/16 2344 02/10/16 0403      Subjective: Asking for more pain medications Says his IV  is not working but does not want medication IM- asking for scheduled med  Objective: Filed Vitals:   02/12/16 1419 02/12/16 1501 02/12/16 2136 02/13/16 0440  BP: 126/84 149/79 141/94 137/66  Pulse: 85 101 125 99  Temp: 99.4 F (37.4 C) 99 F (37.2 C) 99.3 F (37.4 C) 99 F (37.2 C)  TempSrc: Oral Oral Oral Oral  Resp:  14 15 17   Height:      Weight:      SpO2: 95% 96% 96% 99%    Intake/Output Summary (Last 24 hours) at 02/13/16 1047 Last data filed at 02/13/16 0358  Gross per 24 hour  Intake    630 ml  Output      0 ml  Net    630 ml   Filed Weights   02/09/16 1238  Weight: 86.183 kg (190 lb)   Exam:   General: Pt is alert, follows commands appropriately, not in acute distress  Cardiovascular: RRR, S1/S2, no rubs, no gallops  Respiratory: CTA bilaterally, no wheezing, no crackles, no rhonchi  Abdomen: Soft/+BS, non tender, non distended, no guarding  Extremities: No edema, No lymphangitis, No petechiae, No rashes; Right ankle with localized erythema and ecchymosis on the lateral malleolar area. No crepitance. No draining wounds.  Data Reviewed: Basic Metabolic Panel:  Recent Labs Lab 02/08/16 1930 02/09/16 1821 02/10/16 1154 02/12/16 1150  NA 137 137 132* 137  K 3.7 4.0 4.4 4.0  CL 103 104 100* 98*  CO2 26 25 22 29   GLUCOSE 119* 90 109* 96  BUN 14 8 10 9   CREATININE 0.96 0.95 0.89 0.81  CALCIUM 8.9 9.0 8.9 9.5   Liver Function Tests: No results for input(s): AST, ALT, ALKPHOS, BILITOT, PROT, ALBUMIN in the last 168 hours. No results for input(s): LIPASE, AMYLASE in the last 168 hours. No results for input(s): AMMONIA in the last 168 hours. CBC:  Recent Labs Lab 02/08/16 1930 02/09/16 1821 02/10/16 1154 02/12/16 1150  WBC 11.4* 12.8* 15.1* 10.6*  NEUTROABS 8.3* 8.9*  --  7.5  HGB 14.9 15.0 15.6 14.4  HCT 43.6 44.9 46.5 43.1  MCV 92.2 92.0 91.0 90.5  PLT 287 289 232 320   Cardiac Enzymes: No results for input(s): CKTOTAL, CKMB,  CKMBINDEX, TROPONINI in the last 168 hours. BNP (last 3 results) No results for input(s): PROBNP in the last 8760 hours. CBG: No results for input(s): GLUCAP in the last 168 hours.  Recent Results (from the past 240 hour(s))  Blood culture (routine x 2)     Status: Abnormal   Collection Time: 02/08/16  7:30 PM  Result Value Ref Range Status   Specimen Description BLOOD LEFT UPPER ARM  Final   Special Requests   Final    BOTTLES DRAWN AEROBIC AND ANAEROBIC AER 10cc ANA 10cc   Culture  Setup Time   Final    GRAM POSITIVE COCCI IN CLUSTERS ANAEROBIC BOTTLE ONLY CRITICAL RESULT CALLED TO, READ BACK BY AND VERIFIED WITH: J LEDFORD,PHARMD AT 0300 02/10/16 BY Craig Guess  Performed at [place] (A)  Final   Report Status 02/12/2016 FINAL  Final   Organism ID, Bacteria STAPHYLOCOCCUS AUREUS  Final      Susceptibility   Staphylococcus aureus - MIC*    CIPROFLOXACIN <=0.5 SENSITIVE Sensitive     ERYTHROMYCIN <=0.25 SENSITIVE Sensitive     GENTAMICIN <=0.5 SENSITIVE Sensitive     OXACILLIN 0.5 SENSITIVE Sensitive     TETRACYCLINE <=1 SENSITIVE Sensitive     VANCOMYCIN <=0.5 SENSITIVE Sensitive     TRIMETH/SULFA <=10 SENSITIVE Sensitive     CLINDAMYCIN <=0.25 SENSITIVE Sensitive     RIFAMPIN <=0.5 SENSITIVE Sensitive     Inducible Clindamycin NEGATIVE Sensitive     * STAPHYLOCOCCUS AUREUS  Blood Culture ID Panel (Reflexed)     Status: Abnormal   Collection Time: 02/08/16  7:30 PM  Result Value Ref Range Status   Enterococcus species NOT DETECTED NOT DETECTED Final   Vancomycin resistance NOT DETECTED NOT DETECTED Final   Listeria monocytogenes NOT DETECTED NOT DETECTED Final   Staphylococcus species DETECTED (A) NOT DETECTED Final    Comment: JAMES LEDFORD,PHARMD'@0300'$  02/10/16 MKELLY   Staphylococcus aureus DETECTED (A) NOT DETECTED Final    Comment: JAMES LEDFORD,PHARMD'@0300'$  02/10/16 MKELLY   Methicillin resistance NOT DETECTED NOT DETECTED Final    Streptococcus species NOT DETECTED NOT DETECTED Final   Streptococcus agalactiae NOT DETECTED NOT DETECTED Final   Streptococcus pneumoniae NOT DETECTED NOT DETECTED Final   Streptococcus pyogenes NOT DETECTED NOT DETECTED Final   Acinetobacter baumannii NOT DETECTED NOT DETECTED Final   Enterobacteriaceae species NOT DETECTED NOT DETECTED Final   Enterobacter cloacae complex NOT DETECTED NOT DETECTED Final   Escherichia coli NOT DETECTED NOT DETECTED Final   Klebsiella oxytoca NOT DETECTED NOT DETECTED Final   Klebsiella pneumoniae NOT DETECTED NOT DETECTED Final   Proteus species NOT DETECTED NOT DETECTED Final   Serratia marcescens NOT DETECTED NOT DETECTED Final   Carbapenem resistance NOT DETECTED NOT DETECTED Final   Haemophilus influenzae NOT DETECTED NOT DETECTED Final   Neisseria meningitidis NOT DETECTED NOT DETECTED Final   Pseudomonas aeruginosa NOT DETECTED NOT DETECTED Final   Candida albicans NOT DETECTED NOT DETECTED Final   Candida glabrata NOT DETECTED NOT DETECTED Final   Candida krusei NOT DETECTED NOT DETECTED Final   Candida parapsilosis NOT DETECTED NOT DETECTED Final   Candida tropicalis NOT DETECTED NOT DETECTED Final  Blood culture (routine x 2)     Status: None (Preliminary result)   Collection Time: 02/08/16  7:40 PM  Result Value Ref Range Status   Specimen Description BLOOD LEFT FOREARM  Final   Special Requests   Final    BOTTLES DRAWN AEROBIC AND ANAEROBIC AER 10cc ANA 10cc   Culture   Final    NO GROWTH 4 DAYS Performed at [facility]    Report Status PENDING  Incomplete  Blood culture (routine x 2)     Status: Abnormal (Preliminary result)   Collection Time: 02/10/16 12:30 AM  Result Value Ref Range Status   Specimen Description BLOOD RIGHT HAND  Final   Special Requests AEROBIC BOTTLE ONLY 5ML  Final   Culture  Setup Time   Final    GRAM POSITIVE RODS CRITICAL RESULT CALLED TO, READ BACK BY AND VERIFIED WITH: N. Batchelder Pharm.D.  12:50 02/11/16 (wilsonm) AEROBIC BOTTLE ONLY Organism ID to follow    Culture (A)  Final    DIPHTHEROIDS(CORYNEBACTERIUM SPECIES) Standardized susceptibility testing for this  organism is not available.    Report Status PENDING  Incomplete  Blood Culture ID Panel (Reflexed)     Status: None   Collection Time: 02/10/16 12:30 AM  Result Value Ref Range Status   Enterococcus species NOT DETECTED NOT DETECTED Final   Vancomycin resistance NOT DETECTED NOT DETECTED Final   Listeria monocytogenes NOT DETECTED NOT DETECTED Final   Staphylococcus species NOT DETECTED NOT DETECTED Final   Staphylococcus aureus NOT DETECTED NOT DETECTED Final   Methicillin resistance NOT DETECTED NOT DETECTED Final   Streptococcus species NOT DETECTED NOT DETECTED Final   Streptococcus agalactiae NOT DETECTED NOT DETECTED Final   Streptococcus pneumoniae NOT DETECTED NOT DETECTED Final   Streptococcus pyogenes NOT DETECTED NOT DETECTED Final   Acinetobacter baumannii NOT DETECTED NOT DETECTED Final   Enterobacteriaceae species NOT DETECTED NOT DETECTED Final   Enterobacter cloacae complex NOT DETECTED NOT DETECTED Final   Escherichia coli NOT DETECTED NOT DETECTED Final   Klebsiella oxytoca NOT DETECTED NOT DETECTED Final   Klebsiella pneumoniae NOT DETECTED NOT DETECTED Final   Proteus species NOT DETECTED NOT DETECTED Final   Serratia marcescens NOT DETECTED NOT DETECTED Final   Carbapenem resistance NOT DETECTED NOT DETECTED Final   Haemophilus influenzae NOT DETECTED NOT DETECTED Final   Neisseria meningitidis NOT DETECTED NOT DETECTED Final   Pseudomonas aeruginosa NOT DETECTED NOT DETECTED Final   Candida albicans NOT DETECTED NOT DETECTED Final   Candida glabrata NOT DETECTED NOT DETECTED Final   Candida krusei NOT DETECTED NOT DETECTED Final   Candida parapsilosis NOT DETECTED NOT DETECTED Final   Candida tropicalis NOT DETECTED NOT DETECTED Final  Blood culture (routine x 2)     Status: None  (Preliminary result)   Collection Time: 02/10/16 12:40 AM  Result Value Ref Range Status   Specimen Description BLOOD LEFT HAND  Final   Special Requests BOTTLES DRAWN AEROBIC AND ANAEROBIC 5ML  Final   Culture NO GROWTH 2 DAYS  Final   Report Status PENDING  Incomplete     Studies: No results found.   Scheduled Meds: .  ceFAZolin (ANCEF) IV  2 g Intravenous Q8H  . enoxaparin (LOVENOX) injection  40 mg Subcutaneous Q24H  . fentaNYL (SUBLIMAZE) injection  50 mcg Intravenous Once  . nicotine  21 mg Transdermal Daily  . oxyCODONE  30 mg Oral Q12H  . rifampin  300 mg Oral Q12H   Continuous Infusions:   Active Problems:   Septic arthritis ([place])   Cocaine abuse   Tobacco use disorder   Opioid dependence ([place])   Staphylococcus aureus bacteremia   Ankle swelling   Kahlee Metivier Alison Stalling, DO [facility] Pager [phone]  If 7PM-7AM, please contact night-coverage [email or web address] Password [email or web address] 02/13/2016, 10:47 AM    LOS: 3 days

## 2016-02-13 NOTE — Progress Notes (Signed)
IV RN attempted to start IV x2.  Patient stating "yall don't listen, i know where my veins are."  IV RN explained to patient that due to "scarring" of his veins that I cannot just puncture any vein at his request.  Patient smelling of cigarette smoke stating " i know the rules here lady"  IV RN asked patient of his nurse, patient replied "that Cong[place] lady". IV RN spoke with patient RN and explained IV attempt X2 were unsuccessful, and that another IV RN nurse would come and assess as soon as possible.

## 2016-02-13 NOTE — CV Procedure (Signed)
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  There is no underlying destruction of the mitral valve and it does not appear to be endocarditis.   No PFO No vegetations noted  For additional details see full report.  Lashawna Poche C. Duke Salviaandolph, MD, [facility]  02/13/2016  1:54 PM

## 2016-02-13 NOTE — Anesthesia Preprocedure Evaluation (Signed)
Anesthesia Evaluation  Patient identified by MRN, date of birth, ID band Patient awake    Reviewed: Allergy & Precautions, NPO status , Patient's Chart, lab work & pertinent test results  Airway Mallampati: II  TM Distance: >3 FB Neck ROM: Full    Dental  (+) Teeth Intact, Dental Advisory Given   Pulmonary Current Smoker,    breath sounds clear to auscultation       Cardiovascular  Rhythm:Regular Rate:Normal     Neuro/Psych    GI/Hepatic   Endo/Other    Renal/GU      Musculoskeletal   Abdominal   Peds  Hematology   Anesthesia Other Findings   Reproductive/Obstetrics                             Anesthesia Physical Anesthesia Plan  ASA: III  Anesthesia Plan: MAC   Post-op Pain Management:    Induction: Intravenous  Airway Management Planned: Natural Airway and Nasal Cannula  Additional Equipment:   Intra-op Plan:   Post-operative Plan:   Informed Consent: I have reviewed the patients History and Physical, chart, labs and discussed the procedure including the risks, benefits and alternatives for the proposed anesthesia with the patient or authorized representative who has indicated his/her understanding and acceptance.   Dental advisory given  Plan Discussed with: CRNA and Anesthesiologist  Anesthesia Plan Comments:         Anesthesia Quick Evaluation

## 2016-02-13 NOTE — Progress Notes (Signed)
Subjective: 1 Day Post-Op Procedure(s): CANCELLED PROCEDURE Patient reports pain as 3 on 0-10 scale.I feel that he is improving. His WBC is decreasing and he is afebrile. Still no heating as requested two days ago. Hopefully we can help him by Conservative means.The medial side of his ankle is much better. Thus far this is a severe cellulitis and that an abscess does not form. Will watch closely.    Objective: Vital signs in last 24 hours: Temp:  [99 F (37.2 C)-99.4 F (37.4 C)] 99 F (37.2 C) (06/14 0440) Pulse Rate:  [85-125] 99 (06/14 0440) Resp:  [14-17] 17 (06/14 0440) BP: (126-149)/(66-94) 137/66 mmHg (06/14 0440) SpO2:  [95 %-99 %] 99 % (06/14 0440)  Intake/Output from previous day: 06/13 0701 - 06/14 0700 In: 630 [P.O.:630] Out: -  Intake/Output this shift:     Recent Labs  02/10/16 1154 02/12/16 1150  HGB 15.6 14.4    Recent Labs  02/10/16 1154 02/12/16 1150  WBC 15.1* 10.6*  RBC 5.11 4.76  HCT 46.5 43.1  PLT 232 320    Recent Labs  02/10/16 1154 02/12/16 1150  NA 132* 137  K 4.4 4.0  CL 100* 98*  CO2 22 29  BUN 10 9  CREATININE 0.89 0.81  GLUCOSE 109* 96  CALCIUM 8.9 9.5   No results for input(s): LABPT, INR in the last 72 hours.  Compartment soft Cellulitis Laterally.  Assessment/Plan: 1 Day Post-Op Procedure(s): CANCELLED PROCEDURE Up with therapy  Sherry Rogus A 02/13/2016, 8:06 AM

## 2016-02-13 NOTE — Progress Notes (Signed)
[facility]   Reason for visit: Follow up on MSSA bacteremia, ankle swelling  Interval History: demanding pain medications, worried about swelling and worried about bacteremia.  Afebrile. Associated pain in ankle. No diarrhea.   Physical Exam: Constitutional:  Filed Vitals:   02/12/16 2136 02/13/16 0440  BP: 141/94 137/66  Pulse: 125 99  Temp: 99.3 F (37.4 C) 99 F (37.2 C)  Resp: 15 17   patient appears in NAD, pleasant Respiratory: Normal respiratory effort; CTA B Cardiovascular: RRR GI: soft, nt  Review of Systems: Constitutional: negative for fevers and chills Gastrointestinal: negative for diarrhea Musculoskeletal: positive for myalgias and arthralgias, negative for muscle weakness  Lab Results  Component Value Date   WBC 10.6* 02/12/2016   HGB 14.4 02/12/2016   HCT 43.1 02/12/2016   MCV 90.5 02/12/2016   PLT 320 02/12/2016    Lab Results  Component Value Date   CREATININE 0.81 02/12/2016   BUN 9 02/12/2016   NA 137 02/12/2016   K 4.0 02/12/2016   CL 98* 02/12/2016   CO2 29 02/12/2016    Lab Results  Component Value Date   ALT 16* 02/17/2015   AST 16 02/17/2015   ALKPHOS 108 02/17/2015     Microbiology: Recent Results (from the past 240 hour(s))  Blood culture (routine x 2)     Status: Abnormal   Collection Time: 02/08/16  7:30 PM  Result Value Ref Range Status   Specimen Description BLOOD LEFT UPPER ARM  Final   Special Requests   Final    BOTTLES DRAWN AEROBIC AND ANAEROBIC AER 10cc ANA 10cc   Culture  Setup Time   Final    GRAM POSITIVE COCCI IN CLUSTERS ANAEROBIC BOTTLE ONLY CRITICAL RESULT CALLED TO, READ BACK BY AND VERIFIED WITH: J LEDFORD,PHARMD AT 0300 02/10/16 BY Ivory Broad Performed at [facility]    Culture STAPHYLOCOCCUS AUREUS (A)  Final   Report Status 02/12/2016 FINAL  Final   Organism ID, Bacteria STAPHYLOCOCCUS AUREUS  Final      Susceptibility   Staphylococcus aureus - MIC*   CIPROFLOXACIN <=0.5 SENSITIVE Sensitive     ERYTHROMYCIN <=0.25 SENSITIVE Sensitive     GENTAMICIN <=0.5 SENSITIVE Sensitive     OXACILLIN 0.5 SENSITIVE Sensitive     TETRACYCLINE <=1 SENSITIVE Sensitive     VANCOMYCIN <=0.5 SENSITIVE Sensitive     TRIMETH/SULFA <=10 SENSITIVE Sensitive     CLINDAMYCIN <=0.25 SENSITIVE Sensitive     RIFAMPIN <=0.5 SENSITIVE Sensitive     Inducible Clindamycin NEGATIVE Sensitive     * STAPHYLOCOCCUS AUREUS  Blood Culture ID Panel (Reflexed)     Status: Abnormal   Collection Time: 02/08/16  7:30 PM  Result Value Ref Range Status   Enterococcus species NOT DETECTED NOT DETECTED Final   Vancomycin resistance NOT DETECTED NOT DETECTED Final   Listeria monocytogenes NOT DETECTED NOT DETECTED Final   Staphylococcus species DETECTED (A) NOT DETECTED Final    Comment: JAMES LEDFORD,PHARMD@0300  02/10/16 MKELLY   Staphylococcus aureus DETECTED (A) NOT DETECTED Final    Comment: JAMES LEDFORD,PHARMD@0300  02/10/16 MKELLY   Methicillin resistance NOT DETECTED NOT DETECTED Final   Streptococcus species NOT DETECTED NOT DETECTED Final   Streptococcus agalactiae NOT DETECTED NOT DETECTED Final   Streptococcus pneumoniae NOT DETECTED NOT DETECTED Final   Streptococcus pyogenes NOT DETECTED NOT DETECTED Final   Acinetobacter baumannii NOT DETECTED NOT DETECTED Final   Enterobacteriaceae species NOT DETECTED NOT DETECTED Final   Enterobacter cloacae  complex NOT DETECTED NOT DETECTED Final   Escherichia coli NOT DETECTED NOT DETECTED Final   Klebsiella oxytoca NOT DETECTED NOT DETECTED Final   Klebsiella pneumoniae NOT DETECTED NOT DETECTED Final   Proteus species NOT DETECTED NOT DETECTED Final   Serratia marcescens NOT DETECTED NOT DETECTED Final   Carbapenem resistance NOT DETECTED NOT DETECTED Final   Haemophilus influenzae NOT DETECTED NOT DETECTED Final   Neisseria meningitidis NOT DETECTED NOT DETECTED Final   Pseudomonas aeruginosa NOT DETECTED NOT DETECTED  Final   Candida albicans NOT DETECTED NOT DETECTED Final   Candida glabrata NOT DETECTED NOT DETECTED Final   Candida krusei NOT DETECTED NOT DETECTED Final   Candida parapsilosis NOT DETECTED NOT DETECTED Final   Candida tropicalis NOT DETECTED NOT DETECTED Final  Blood culture (routine x 2)     Status: None (Preliminary result)   Collection Time: 02/08/16  7:40 PM  Result Value Ref Range Status   Specimen Description BLOOD LEFT FOREARM  Final   Special Requests   Final    BOTTLES DRAWN AEROBIC AND ANAEROBIC AER 10cc ANA 10cc   Culture   Final    NO GROWTH 4 DAYS Performed at [facility]    Report Status PENDING  Incomplete  Blood culture (routine x 2)     Status: Abnormal (Preliminary result)   Collection Time: 02/10/16 12:30 AM  Result Value Ref Range Status   Specimen Description BLOOD RIGHT HAND  Final   Special Requests AEROBIC BOTTLE ONLY  Final   Culture  Setup Time   Final    GRAM POSITIVE RODS CRITICAL RESULT CALLED TO, READ BACK BY AND VERIFIED WITH: N. Batchelder Pharm.D. 12:50 02/11/16 (wilsonm) AEROBIC BOTTLE ONLY Organism ID to follow    Culture (A)  Final    DIPHTHEROIDS(CORYNEBACTERIUM SPECIES) Standardized susceptibility testing for this organism is not available.    Report Status PENDING  Incomplete  Blood Culture ID Panel (Reflexed)     Status: None   Collection Time: 02/10/16 12:30 AM  Result Value Ref Range Status   Enterococcus species NOT DETECTED NOT DETECTED Final   Vancomycin resistance NOT DETECTED NOT DETECTED Final   Listeria monocytogenes NOT DETECTED NOT DETECTED Final   Staphylococcus species NOT DETECTED NOT DETECTED Final   Staphylococcus aureus NOT DETECTED NOT DETECTED Final   Methicillin resistance NOT DETECTED NOT DETECTED Final   Streptococcus species NOT DETECTED NOT DETECTED Final   Streptococcus agalactiae NOT DETECTED NOT DETECTED Final   Streptococcus pneumoniae NOT DETECTED NOT DETECTED Final   Streptococcus pyogenes  NOT DETECTED NOT DETECTED Final   Acinetobacter baumannii NOT DETECTED NOT DETECTED Final   Enterobacteriaceae species NOT DETECTED NOT DETECTED Final   Enterobacter cloacae complex NOT DETECTED NOT DETECTED Final   Escherichia coli NOT DETECTED NOT DETECTED Final   Klebsiella oxytoca NOT DETECTED NOT DETECTED Final   Klebsiella pneumoniae NOT DETECTED NOT DETECTED Final   Proteus species NOT DETECTED NOT DETECTED Final   Serratia marcescens NOT DETECTED NOT DETECTED Final   Carbapenem resistance NOT DETECTED NOT DETECTED Final   Haemophilus influenzae NOT DETECTED NOT DETECTED Final   Neisseria meningitidis NOT DETECTED NOT DETECTED Final   Pseudomonas aeruginosa NOT DETECTED NOT DETECTED Final   Candida albicans NOT DETECTED NOT DETECTED Final   Candida glabrata NOT DETECTED NOT DETECTED Final   Candida krusei NOT DETECTED NOT DETECTED Final   Candida parapsilosis NOT DETECTED NOT DETECTED Final   Candida tropicalis NOT DETECTED NOT DETECTED Final  Blood  culture (routine x 2)     Status: None (Preliminary result)   Collection Time: 02/10/16 12:40 AM  Result Value Ref Range Status   Specimen Description BLOOD LEFT HAND  Final   Special Requests BOTTLES DRAWN AEROBIC AND ANAEROBIC 5ML  Final   Culture NO GROWTH 2 DAYS  Final   Report Status PENDING  Incomplete    Impression/Plan:  1. MSSA bacteremia - will need TEE which will help determine duration.  TTE without obvious vegetation. If TEE ok, will consider oritavancin for cellulitis and linezolid for discharge with close follow up.   2. Ankle - swelling but no obvious issues on MRI.  May be from the torn ligament. Improved WBC yesterday.  No surgery indicated.   3. dispo - after leaving AMA with IV, would not send out with picc line.

## 2016-02-13 NOTE — Anesthesia Postprocedure Evaluation (Signed)
Anesthesia Post Note  Patient: Kenneth DeistChristopher B Charles  Procedure(s) Performed: Procedure(s) (LRB): TRANSESOPHAGEAL ECHOCARDIOGRAM (TEE) (N/A)  Patient location during evaluation: Endoscopy Anesthesia Type: MAC Level of consciousness: awake, oriented and awake and alert Pain management: pain level controlled Vital Signs Assessment: post-procedure vital signs reviewed and stable Respiratory status: spontaneous breathing, respiratory function stable and nonlabored ventilation Cardiovascular status: blood pressure returned to baseline Anesthetic complications: no    Last Vitals:  Filed Vitals:   02/13/16 1405 02/13/16 1434  BP: 140/78 131/75  Pulse: 81 89  Temp:  36.8 C  Resp: 12     Last Pain:  Filed Vitals:   02/13/16 1436  PainSc: 7                  Israa Caban COKER

## 2016-02-13 NOTE — Care Management (Addendum)
Patient has Medicaid ID number [id number]   Zyvox requires per authorization.  Medicaid Pre authorization number 1 [phone] . MD aware .  Copy of medicaid card faxed to admitting   Admitting returned call patient's Medicaid is not active . Told patient he said " I know a Artistfinancial counselor is working on it" .  Called [facility] FA awaiting call back.  Ronny FlurryHeather Daenerys Buttram RN BSN [phone]

## 2016-02-13 NOTE — Progress Notes (Signed)
Pt refused to take anything for his temp=101.4. RN will continue to monitor.

## 2016-02-13 NOTE — Progress Notes (Addendum)
Patient complaining that the IV Dilaudid given earlier had no effect and he could not even feel/taste the NS flush.  This RN flushed the IV site twice and flush good but no blood return.  Requested another RN and IV Team consult to put a new IV site due to left ankle pain and scheduled IV antibiotic at 1030.  As of this writing, staff RN was unsuccessful with 1 attempt. IV RN still trying to put a new IV site with 2 unsuccessful attempts already.

## 2016-02-13 NOTE — Transfer of Care (Signed)
Immediate Anesthesia Transfer of Care Note  Patient: Kenneth Charles  Procedure(s) Performed: Procedure(s): TRANSESOPHAGEAL ECHOCARDIOGRAM (TEE) (N/A)  Patient Location: Endoscopy Unit  Anesthesia Type:MAC  Level of Consciousness: awake, alert  and oriented  Airway & Oxygen Therapy: Patient Spontanous Breathing and Patient connected to nasal cannula oxygen  Post-op Assessment: Report given to RN, Post -op Vital signs reviewed and stable and Patient moving all extremities X 4  Post vital signs: Reviewed and stable  Last Vitals:  Filed Vitals:   02/13/16 0440 02/13/16 1248  BP: 137/66 132/84  Pulse: 99   Temp: 37.2 C 37.4 C  Resp: 17 22    Last Pain:  Filed Vitals:   02/13/16 1250  PainSc: 7       Patients Stated Pain Goal: 3 (02/13/16 1248)  Complications: No apparent anesthesia complications

## 2016-02-13 NOTE — H&P (Signed)
Patient ID: Kenneth Charles MRN: [id number] DOB/AGE: 04-01-1981 35 y.o.  Admit date: 02/09/2016  Chief Complaint    MSSA bacteremia   HPI: [age] yo M with PMH of polysubstance abuse came in with cellulitis of L ankle. He initially left AMA with IV, but later came back with worsening pain. Blood culture positive for MSSR. Cardiology coonsulted for TEE to r/o SBE. He is still febrile with temp 101.6. Vital sign otherwise is normal. Hgb and platelet normal.   Review of Systems:  As per HPI  Past Medical History  Diagnosis Date  . Asthma   . Anxiety   . Heroin addiction (HCC)     Medications Prior to Admission  Medication Sig Dispense Refill  . albuterol (PROVENTIL HFA;VENTOLIN HFA) 108 (90 Base) MCG/ACT inhaler Inhale 2 puffs into the lungs every 6 (six) hours as needed for wheezing or shortness of breath.    . Oxycodone HCl 10 MG TABS Take 10 mg by mouth 3 (three) times daily.        No Known Allergies  Social History   Social History  . Marital Status: Single    Spouse Name: N/A  . Number of Children: N/A  . Years of Education: N/A   Occupational History  . Not on file.   Social History Main Topics  . Smoking status: Current Every Day Smoker -- 0.50 packs/day for 18 years    Types: Cigarettes  . Smokeless tobacco: Never Used  . Alcohol Use: No  . Drug Use: Yes    Special: Marijuana  . Sexual Activity: Not on file   Other Topics Concern  . Not on file   Social History Narrative    Family History  Problem Relation Age of Onset  . Hypertension Father   . Hypertension Mother     PHYSICAL EXAM: Filed Vitals:   02/12/16 2136 02/13/16 0440  BP: 141/94 137/66  Pulse: 125 99  Temp: 99.3 F (37.4 C) 99 F (37.2 C)  Resp: 15 17   General:  Ill-appearing. Diaphoretic.  No respiratory difficulty HEENT: normal Neck: supple. no JVD. Carotids 2+ bilat; no bruits. No lymphadenopathy or thryomegaly appreciated. Cor: Tachycardic.  Regular rhythm. No  rubs, gallops or murmurs. Lungs: clear Abdomen: soft, nontender, nondistended. No hepatosplenomegaly. No bruits or masses. Good bowel sounds. Extremities: no cyanosis, clubbing, rash, L ankle erythema and edema Neuro: alert & oriented x 3, cranial nerves grossly intact. moves all 4 extremities w/o difficulty. Affect pleasant.   Results for orders placed or performed during the hospital encounter of 02/09/16 (from the past 24 hour(s))  Basic metabolic panel     Status: Abnormal   Collection Time: 02/12/16 11:50 AM  Result Value Ref Range   Sodium 137 135 - 145 mmol/L   Potassium 4.0 3.5 - 5.1 mmol/L   Chloride 98 (L) 101 - 111 mmol/L   CO2 29 22 - 32 mmol/L   Glucose, Bld 96 65 - 99 mg/dL   BUN 9 6 - 20 mg/dL   Creatinine, Ser 2.130.81 0.61 - 1.24 mg/dL   Calcium 9.5 8.9 - 08.610.3 mg/dL   GFR calc non Af Amer >60 >60 mL/min   GFR calc Af Amer >60 >60 mL/min   Anion gap 10 5 - 15  CBC with Differential/Platelet     Status: Abnormal   Collection Time: 02/12/16 11:50 AM  Result Value Ref Range   WBC 10.6 (H) 4.0 - 10.5 K/uL   RBC 4.76 4.22 - 5.81 MIL/uL  Hemoglobin 14.4 13.0 - 17.0 g/dL   HCT 16.1 09.6 - 04.5 %   MCV 90.5 78.0 - 100.0 fL   MCH 30.3 26.0 - 34.0 pg   MCHC 33.4 30.0 - 36.0 g/dL   RDW 40.9 81.1 - 91.4 %   Platelets 320 150 - 400 K/uL   Neutrophils Relative % 71 %   Neutro Abs 7.5 1.7 - 7.7 K/uL   Lymphocytes Relative 16 %   Lymphs Abs 1.7 0.7 - 4.0 K/uL   Monocytes Relative 10 %   Monocytes Absolute 1.1 (H) 0.1 - 1.0 K/uL   Eosinophils Relative 3 %   Eosinophils Absolute 0.4 0.0 - 0.7 K/uL   Basophils Relative 0 %   Basophils Absolute 0.0 0.0 - 0.1 K/uL   No results found.     ASSESSMENT/PLAN:  26M with polysubstance abuse here with L ankle cellulitis and S. Aureus bacteremia.  Here for TEE to evaluate for endocarditis.  Kenneth Charles is a 35 y.o. male who has presented today for surgery, with the diagnosis of bacteremia. The various methods of  treatment have been discussed with the patient and family. After consideration of risks, benefits and other options for treatment, the patient has consented to Procedure(s): TRANSESOPHAGEAL ECHOCARDIOGRAM (TEE) (N/A) as a surgical intervention . The patient's history has been reviewed, patient examined, no change in status, stable for surgery. I have reviewed the patient's chart and labs. Questions were answered to the patient's satisfaction.   Signed: Birgitta Uhlir C. Duke Salvia, MD, [facility]  02/13/2016, 8:36 AM

## 2016-02-14 ENCOUNTER — Encounter (HOSPITAL_COMMUNITY): Payer: Self-pay | Admitting: Cardiovascular Disease

## 2016-02-14 DIAGNOSIS — F1120 Opioid dependence, uncomplicated: Secondary | ICD-10-CM

## 2016-02-14 DIAGNOSIS — A4901 Methicillin susceptible Staphylococcus aureus infection, unspecified site: Secondary | ICD-10-CM

## 2016-02-14 DIAGNOSIS — M25472 Effusion, left ankle: Secondary | ICD-10-CM

## 2016-02-14 MED ORDER — ACETAMINOPHEN 325 MG PO TABS: 650.0000 mg | ORAL_TABLET | Freq: Four times a day (QID) | ORAL | Status: AC | PRN

## 2016-02-14 MED ORDER — LINEZOLID 600 MG PO TABS: 600.0000 mg | ORAL_TABLET | Freq: Two times a day (BID) | ORAL | Status: DC

## 2016-02-14 MED ORDER — IBUPROFEN 600 MG PO TABS: 600.0000 mg | ORAL_TABLET | Freq: Four times a day (QID) | ORAL | Status: AC | PRN

## 2016-02-14 MED ORDER — OXYCODONE HCL ER 10 MG PO T12A: 20.0000 mg | EXTENDED_RELEASE_TABLET | Freq: Two times a day (BID) | ORAL | Status: DC

## 2016-02-14 NOTE — Discharge Instructions (Signed)
Take voucher for [facility] to   [facility]   [address] [place] , [place]   Phone [phone]

## 2016-02-14 NOTE — Clinical Social Work Note (Signed)
CSW attempted to speak with patient to inform him that due to not having insurance it will be difficult to find a SNF that would be willing to accept him per PT recommendations and past admission to previous SNF.  However per bedside nurse's note patient was discharged home by security guards due to patient being verbally abusive, name calling and refusing to allow staff to remove iv access.  Kenneth Charles Neal, MSW, Theresia MajorsLCSWA [phone] 02/14/2016 5:17 PM

## 2016-02-14 NOTE — Care Management Note (Addendum)
Case Management Note  Patient Details  Name: Kenneth Charles MRN: [id number] Date of Birth: [birth year]  Subjective/Objective:                    Action/Plan:  Received consent from patient to apply for assistance for Zyvox . Patient does qualify for assistance up to 30 day free supply .   ID [id number]  BIN [id number]  Rx GRP [phone]  30 day free voucher only covers Brand name .  [facility]  [facility] and [place] [facility] [place] carry Brand name.   [facility] [address] [facility].   30 day free voucher given to patient and above explained . [facility] [address] information address and phone number given to patient with voucher .  Patient voiced understanding .   Expected Discharge Date:                  Expected Discharge Plan:  Home w Home Health Services  In-House Referral:     Discharge planning Services  CM Consult, Medication Assistance  Post Acute Care Choice:    Choice offered to:  Patient  DME Arranged:    DME Agency:     HH Arranged:    HH Agency:     Status of Service:  In process, will continue to follow  Medicare Important Message Given:    Date Medicare IM Given:    Medicare IM give by:    Date Additional Medicare IM Given:    Additional Medicare Important Message give by:     If discussed at Long Length of Stay Meetings, dates discussed:    Additional Comments:  Kingsley PlanWile, Londynn Sonoda Marie, RN 02/14/2016, 10:16 AM

## 2016-02-14 NOTE — Evaluation (Signed)
Physical Therapy Evaluation Patient Details Name: Kenneth DeistChristopher B Charles MRN: [id number] DOB: 05-01-1981 Today's Date: 02/14/2016   History of Present Illness  [name] is a 35 y.o. male, With past medical history significant for left calcaneal fracture status post repair in 2016 with hardware inserted presenting today for 2 days history of increased swelling and pain left heel area with fever. Patient has history of drug abuse but reports that he never used any IV drug abuse in the last 8 months.  Clinical Impression  Patient presents with pain limiting independence with mobility.  Feel he is unsafe to enter his third floor apartment unless he is willing to 1) stay with family/friends while he attempts to obtain ground level apartment; 2) goes to SNF level rehab until able to tolerate weight bearing on his foot; or 3) sits on stairs to bump up on his bottom up 3 flights to enter his apartment.  PT will follow acutely and continue education on importance of movement, tactile stimulation and some weight bearing on L foot to avoid sympathetic overshoot.  Initiated patient education this session.     Follow Up Recommendations SNF (versus staying with friends/family to assist with home entry)    Equipment Recommendations  Rolling walker with 5" wheels    Recommendations for Other Services       Precautions / Restrictions Precautions Precautions: Fall Restrictions Weight Bearing Restrictions: No (unable to tolerate weight bearing on L foot)      Mobility  Bed Mobility Overal bed mobility: Modified Independent                Transfers Overall transfer level: Modified independent Equipment used: Rolling walker (2 wheeled)             General transfer comment: stands on R LE only  Ambulation/Gait Ambulation/Gait assistance: Min guard Ambulation Distance (Feet): 120 Feet Assistive device: Rolling walker (2 wheeled) Gait Pattern/deviations: Step-to pattern      General Gait Details: NWB L LE, difficulty and unsafey due to very limited weight on L UE (so basically hopping on one foot with R UE support)  Stairs Stairs: Yes Stairs assistance: Supervision Stair Management: One rail Right Number of Stairs: 3 General stair comments: sat on stairs to bump up on bottom; unable to safely negotiate without weight on L foot or UE  Wheelchair Mobility    Modified Rankin (Stroke Patients Only)       Balance Overall balance assessment: Needs assistance         Standing balance support: Single extremity supported Standing balance-Leahy Scale: Poor Standing balance comment: due to unable to tolerate weight on L LE                             Pertinent Vitals/Pain Pain Assessment: 0-10 Pain Score: 10-Worst pain ever Pain Location: L ankle with dependency Pain Descriptors / Indicators: Throbbing;Aching Pain Intervention(s): Limited activity within patient's tolerance;Monitored during session;Repositioned    Home Living Family/patient expects to be discharged to:: Private residence Living Arrangements: Alone   Type of Home: Apartment Home Access: Stairs to enter Entrance Stairs-Rails: Doctor, general practiceight;Left Entrance Stairs-Number of Steps: 3rd floor apartment (3 flights) Home Layout: One level Home Equipment: Crutches      Prior Function Level of Independence: Independent               Hand Dominance        Extremity/Trunk Assessment   Upper Extremity Assessment: LUE deficits/detail  LUE Deficits / Details: AROM limited due to pain, unable to weight bear much in L UE with walker   Lower Extremity Assessment: LLE deficits/detail   LLE Deficits / Details: edema noted throughout, pallor with elevation errythema with dependency and severe pain (out of proportion to injury)     Communication   Communication: No difficulties  Cognition Arousal/Alertness: Awake/alert Behavior During Therapy: Agitated (at times  yelling on phone, multiple complaints about care and not knowing anything) Overall Cognitive Status: Within Functional Limits for tasks assessed                      General Comments General comments (skin integrity, edema, etc.): Discussion regarding RSD and need for movement and some weight on L LE to avoid sympathetic overshoot of mediated pain.  Issued information printed from website    Exercises        Assessment/Plan    PT Assessment Patient needs continued PT services  PT Diagnosis Difficulty walking;Acute pain   PT Problem List Decreased range of motion;Decreased activity tolerance;Decreased balance;Decreased mobility;Decreased strength;Pain;Decreased safety awareness;Decreased knowledge of use of DME  PT Treatment Interventions DME instruction;Gait training;Stair training;Balance training;Functional mobility training;Therapeutic activities;Therapeutic exercise;Patient/family education   PT Goals (Current goals can be found in the Care Plan section) Acute Rehab PT Goals Patient Stated Goal: To go to rehab PT Goal Formulation: With patient Time For Goal Achievement: 02/18/16 Potential to Achieve Goals: Fair    Frequency Min 3X/week   Barriers to discharge Other (comment) yelling at folks on the phone, not sure of outside support     Co-evaluation               End of Session Equipment Utilized During Treatment: Gait belt Activity Tolerance: Patient limited by pain Patient left: in bed;with call bell/phone within reach Nurse Communication: Mobility status         Time: 1610-9604 PT Time Calculation (min) (ACUTE ONLY): 37 min   Charges:   PT Evaluation $PT Eval Moderate Complexity: 1 Procedure PT Treatments $Gait Training: 8-22 mins   PT G CodesElray Mcgregor 2016-03-07, 3:30 PM  Sheran Lawless, PT [phone] 03/07/2016

## 2016-02-14 NOTE — Discharge Planning (Signed)
Security called to assist with discharge, as pt verbally abusive, name calling, refusing to allow staff to remove IV access. Refused to sign AVS. Pt discharged home by security guards in stable condition.

## 2016-02-14 NOTE — Care Management (Signed)
Patient's Medicaid is not active . Home health agencies follow Medicaid guidelines for HHPT for uninsured patient's.  Called Tiffany with [facility] . Patient does not have a Medicaid qualifying DX for HHPT , therefore , they are unable to provide HHPT.  Ronny FlurryHeather Sayvon Arterberry RN BSN [phone]

## 2016-02-14 NOTE — Progress Notes (Signed)
Pt refused AM labs

## 2016-02-14 NOTE — Progress Notes (Signed)
[facility]   Reason for visit: Follow up on MSSA bacteremia, ankle swelling  Interval History:  No new complaint.  Not getting labs.  Tmax 101.   Physical Exam: Constitutional:  Filed Vitals:   02/14/16 0539 02/14/16 0556  BP: 140/123 138/7  Pulse: 89   Temp: 98.6 F (37 C)   Resp: 18    patient appears in NAD, pleasant Respiratory: Normal respiratory effort; CTA B Cardiovascular: RRR GI: soft, nt  Review of Systems: Constitutional: negative for chills Gastrointestinal: negative for diarrhea Musculoskeletal: positive for myalgias and arthralgias, negative for muscle weakness  Lab Results  Component Value Date   WBC 10.6* 02/12/2016   HGB 14.4 02/12/2016   HCT 43.1 02/12/2016   MCV 90.5 02/12/2016   PLT 320 02/12/2016    Lab Results  Component Value Date   CREATININE 0.81 02/12/2016   BUN 9 02/12/2016   NA 137 02/12/2016   K 4.0 02/12/2016   CL 98* 02/12/2016   CO2 29 02/12/2016    Lab Results  Component Value Date   ALT 16* 02/17/2015   AST 16 02/17/2015   ALKPHOS 108 02/17/2015     Microbiology: Recent Results (from the past 240 hour(s))  Blood culture (routine x 2)     Status: Abnormal   Collection Time: 02/08/16  7:30 PM  Result Value Ref Range Status   Specimen Description BLOOD LEFT UPPER ARM  Final   Special Requests   Final    BOTTLES DRAWN AEROBIC AND ANAEROBIC AER 10cc ANA 10cc   Culture  Setup Time   Final    GRAM POSITIVE COCCI IN CLUSTERS ANAEROBIC BOTTLE ONLY CRITICAL RESULT CALLED TO, READ BACK BY AND VERIFIED WITH: J LEDFORD,PHARMD AT 0300 02/10/16 BY Ivory Broad Performed at [facility]    Culture STAPHYLOCOCCUS AUREUS (A)  Final   Report Status 02/12/2016 FINAL  Final   Organism ID, Bacteria STAPHYLOCOCCUS AUREUS  Final      Susceptibility   Staphylococcus aureus - MIC*    CIPROFLOXACIN <=0.5 SENSITIVE Sensitive     ERYTHROMYCIN <=0.25 SENSITIVE Sensitive     GENTAMICIN <=0.5 SENSITIVE Sensitive      OXACILLIN 0.5 SENSITIVE Sensitive     TETRACYCLINE <=1 SENSITIVE Sensitive     VANCOMYCIN <=0.5 SENSITIVE Sensitive     TRIMETH/SULFA <=10 SENSITIVE Sensitive     CLINDAMYCIN <=0.25 SENSITIVE Sensitive     RIFAMPIN <=0.5 SENSITIVE Sensitive     Inducible Clindamycin NEGATIVE Sensitive     * STAPHYLOCOCCUS AUREUS  Blood Culture ID Panel (Reflexed)     Status: Abnormal   Collection Time: 02/08/16  7:30 PM  Result Value Ref Range Status   Enterococcus species NOT DETECTED NOT DETECTED Final   Vancomycin resistance NOT DETECTED NOT DETECTED Final   Listeria monocytogenes NOT DETECTED NOT DETECTED Final   Staphylococcus species DETECTED (A) NOT DETECTED Final    Comment: JAMES LEDFORD,PHARMD@0300  02/10/16 MKELLY   Staphylococcus aureus DETECTED (A) NOT DETECTED Final    Comment: JAMES LEDFORD,PHARMD@0300  02/10/16 MKELLY   Methicillin resistance NOT DETECTED NOT DETECTED Final   Streptococcus species NOT DETECTED NOT DETECTED Final   Streptococcus agalactiae NOT DETECTED NOT DETECTED Final   Streptococcus pneumoniae NOT DETECTED NOT DETECTED Final   Streptococcus pyogenes NOT DETECTED NOT DETECTED Final   Acinetobacter baumannii NOT DETECTED NOT DETECTED Final   Enterobacteriaceae species NOT DETECTED NOT DETECTED Final   Enterobacter cloacae complex NOT DETECTED NOT DETECTED Final   Escherichia coli  NOT DETECTED NOT DETECTED Final   Klebsiella oxytoca NOT DETECTED NOT DETECTED Final   Klebsiella pneumoniae NOT DETECTED NOT DETECTED Final   Proteus species NOT DETECTED NOT DETECTED Final   Serratia marcescens NOT DETECTED NOT DETECTED Final   Carbapenem resistance NOT DETECTED NOT DETECTED Final   Haemophilus influenzae NOT DETECTED NOT DETECTED Final   Neisseria meningitidis NOT DETECTED NOT DETECTED Final   Pseudomonas aeruginosa NOT DETECTED NOT DETECTED Final   Candida albicans NOT DETECTED NOT DETECTED Final   Candida glabrata NOT DETECTED NOT DETECTED Final   Candida krusei  NOT DETECTED NOT DETECTED Final   Candida parapsilosis NOT DETECTED NOT DETECTED Final   Candida tropicalis NOT DETECTED NOT DETECTED Final  Blood culture (routine x 2)     Status: None   Collection Time: 02/08/16  7:40 PM  Result Value Ref Range Status   Specimen Description BLOOD LEFT FOREARM  Final   Special Requests   Final    BOTTLES DRAWN AEROBIC AND ANAEROBIC AER 10cc ANA 10cc   Culture   Final    NO GROWTH 5 DAYS Performed at [facility]    Report Status 02/13/2016 FINAL  Final  Blood culture (routine x 2)     Status: Abnormal   Collection Time: 02/10/16 12:30 AM  Result Value Ref Range Status   Specimen Description BLOOD RIGHT HAND  Final   Special Requests AEROBIC BOTTLE ONLY 5ML  Final   Culture  Setup Time   Final    GRAM POSITIVE RODS CRITICAL RESULT CALLED TO, READ BACK BY AND VERIFIED WITH: N. Batchelder Pharm.D. 12:50 02/11/16 ([email or web address]) AEROBIC BOTTLE ONLY    Culture (A)  Final    DIPHTHEROIDS(CORYNEBACTERIUM SPECIES) Standardized susceptibility testing for this organism is not available.    Report Status 02/13/2016 FINAL  Final  Blood Culture ID Panel (Reflexed)     Status: None   Collection Time: 02/10/16 12:30 AM  Result Value Ref Range Status   Enterococcus species NOT DETECTED NOT DETECTED Final   Vancomycin resistance NOT DETECTED NOT DETECTED Final   Listeria monocytogenes NOT DETECTED NOT DETECTED Final   Staphylococcus species NOT DETECTED NOT DETECTED Final   Staphylococcus aureus NOT DETECTED NOT DETECTED Final   Methicillin resistance NOT DETECTED NOT DETECTED Final   Streptococcus species NOT DETECTED NOT DETECTED Final   Streptococcus agalactiae NOT DETECTED NOT DETECTED Final   Streptococcus pneumoniae NOT DETECTED NOT DETECTED Final   Streptococcus pyogenes NOT DETECTED NOT DETECTED Final   Acinetobacter baumannii NOT DETECTED NOT DETECTED Final   Enterobacteriaceae species NOT DETECTED NOT DETECTED Final   Enterobacter cloacae complex  NOT DETECTED NOT DETECTED Final   Escherichia coli NOT DETECTED NOT DETECTED Final   Klebsiella oxytoca NOT DETECTED NOT DETECTED Final   Klebsiella pneumoniae NOT DETECTED NOT DETECTED Final   Proteus species NOT DETECTED NOT DETECTED Final   Serratia marcescens NOT DETECTED NOT DETECTED Final   Carbapenem resistance NOT DETECTED NOT DETECTED Final   Haemophilus influenzae NOT DETECTED NOT DETECTED Final   Neisseria meningitidis NOT DETECTED NOT DETECTED Final   Pseudomonas aeruginosa NOT DETECTED NOT DETECTED Final   Candida albicans NOT DETECTED NOT DETECTED Final   Candida glabrata NOT DETECTED NOT DETECTED Final   Candida krusei NOT DETECTED NOT DETECTED Final   Candida parapsilosis NOT DETECTED NOT DETECTED Final   Candida tropicalis NOT DETECTED NOT DETECTED Final  Blood culture (routine x 2)     Status: None (Preliminary result)   Collection Time:  02/10/16 12:40 AM  Result Value Ref Range Status   Specimen Description BLOOD LEFT HAND  Final   Special Requests BOTTLES DRAWN AEROBIC AND ANAEROBIC  Final   Culture NO GROWTH 3 DAYS  Final   Report Status PENDING  Incomplete    Impression/Plan:  1. MSSA bacteremia - TEE negative.  In light of his leaving AMA with line in, IV outpatient treatment would be high risk for issues of inappropriate use and with negative TEE and negative repeat blood cultures for Staph aureus, continuation of linezolid orally for 2 weeks at discharge is best for him.   2. Ankle - swelling but no obvious issues on MRI.  May be from the torn ligament.  No surgery indicated.    I will sign off, please call with questions. thanks

## 2016-02-14 NOTE — Discharge Summary (Signed)
Physician Discharge Summary  Kenneth Charles WUJ:[id number] DOB: 15-Oct-1980 DOA: 02/09/2016  PCP: Billee Cashing, MD  Admit date: 02/09/2016 Discharge date: 02/14/2016   Recommendations for Outpatient Follow-Up:   1. To follow up with  Dr. Darrelyn Hillock on Monday between 8-12 2.  refused labs draws- follow up BMP , CBC as outpatient 3. Given 14 days of PO zyvox 4. To follow with pain management dr for further pain medications 5. Elevate extremity   Discharge Diagnosis:   Active Problems:   Septic arthritis (HCC)   Cocaine abuse   Tobacco use disorder   Opioid dependence (HCC)   Staphylococcus aureus bacteremia   Ankle swelling   Cellulitis of left lower extremity   Discharge disposition:  Home.  Discharge Condition: Improved.  Diet recommendation: Regular.  Wound care: None.   History of Present Illness:   Kenneth Charles is a 35 y.o. male, With past medical history significant for left calcaneal fracture status post repair in 2016 with hardware inserted presenting today for 2 days history of increased swelling and pain left heel area with fever. Patient has history of drug abuse but reports that he never used any IV drug abuse in the last 8 months. Denies any chest pains or shortness of breath. Reports nausea but no vomiting . MRI was done however was not very helpful due to artifact.   Hospital Course by Problem:   MSSA bacteremia and cellulitis of his left ankle - TEE negative.  In light of his leaving AMA with line in, IV outpatient treatment would be high risk for issues of inappropriate use and with negative TEE and negative repeat blood cultures for Staph aureus, continuation of linezolid orally for 2 weeks at discharge   Pain- has outpatient pain dr.  And is under contract, defer to PCP  Polysubstance abuse -last snorted cocaine one week prior to admit -pt states he last used heroin 7-8 months prior to admit, but police reported pt was actively  using when he left AMA on 6/9 -UDS--positive THC and opioids  Refused PT, would rather eat lunch    Medical Consultants:    ID  ortho   Discharge Exam:   Filed Vitals:   02/14/16 0539 02/14/16 0556  BP: 140/123 138/7  Pulse: 89   Temp: 98.6 F (37 C)   Resp: 18    Filed Vitals:   02/13/16 1717 02/13/16 2116 02/14/16 0539 02/14/16 0556  BP: 132/75 139/80 140/123 138/7  Pulse: 106 106 89   Temp: 99 F (37.2 C) 101.4 F (38.6 C) 98.6 F (37 C)   TempSrc: Oral Oral Oral   Resp: 16 17 18    Height:      Weight:      SpO2: 99% 97% 100%     Gen:  NAD Left ankle with bruising but redness has improved significantly   The results of significant diagnostics from this hospitalization (including imaging, microbiology, ancillary and laboratory) are listed below for reference.     Procedures and Diagnostic Studies:   Mr Ankle Left W Wo Contrast  02/10/2016  CLINICAL DATA:  Possible septic joint.  IV drug abuse. EXAM: MRI OF THE LEFT ANKLE WITHOUT AND WITH CONTRAST TECHNIQUE: Multiplanar, multisequence MR imaging of the ankle was performed before and after the administration of intravenous contrast. CONTRAST:  19mL MULTIHANCE GADOBENATE DIMEGLUMINE 529 MG/ML IV SOLN COMPARISON:  02/08/2016 FINDINGS: A multitude of series were obtained because the patient kept leaving against medical advice multiple times, and ended up being scanned  on multiple scan nurse. Calcaneal plate and screw fixator device significantly reduces diagnostic sensitivity and specificity due to extensive metal artifact. TENDONS Peroneal: Not visible along much of their course, but intact were visible. Posteromedial: Mild tenosynovitis of the flexor hallucis longus at the level of the knot of Henry. Anterior: Unremarkable Achilles: A unremarkable, partially obscured distally. Plantar Fascia: Unremarkable LIGAMENTS Lateral: The inferior tibiofibular ligaments appear intact. Abnormally attenuated anterior talofibular  ligament with surrounding inflammatory findings, images 14-16 series 6, likely from a prior tear, anterolateral impingement not excluded. Calcaneofibular ligament not visible, completely obscured. Medial: Thickened superomedial portion of the spring ligament. Inferior components of the spring ligament are thought to be intact. Deltoid ligament seems intact. CARTILAGE Ankle Joint: No significant osteochondral lesion observed. Subtalar Joints/Sinus Tarsi: There is low-level edema in the talus along the posterior subtalar joint with degenerative findings such as a small degenerative subcortical cyst on image 11/4. Low-level edema in the adjacent sinus tarsi, although less than is typically seen in sinus tarsi syndrome. Part of the talus is obscured by metal artifact in the calcaneus. Bones: There is thought to be loss of height of the calcaneus related to the prior fracture, although the calcaneus is essentially completely obscured by the metal artifact. No findings of septic joint. Post-contrast images are severely degraded by motion and field heterogeneity, and are not considered reliable for assessment of enhancement. IMPRESSION: IMPRESSION 1. No compelling findings of septic joint. Trace tibiotalar joint effusion but without compelling synovitis. 2. Edema in the talus is felt to be highly likely to be due to degenerative subtalar arthropathy rather than osteomyelitis. 3. Severely degraded images by both motion artifact and metal artifact from the calcaneal hardware. 4. Torn anterior talofibular ligament with local inflammatory findings reason the possibility of anterolateral impingement. 5. Ancillary relatively minor findings including mild tenosynovitis of the flexor hallucis longus and thickening of the superomedial portion of the spring ligament. These discussions are primarily in accord with the preliminary report issued by Dr. Cherly Hensen on 02/09/2016. The only potentially significant additional finding is the  apparent tear of the ATFL and possibility of anterolateral impingement. Electronically Signed   By: Gaylyn Rong M.D.   On: 02/10/2016 11:52     Labs:   Basic Metabolic Panel:  Recent Labs Lab 02/08/16 1930 02/09/16 1821 02/10/16 1154 02/12/16 1150  NA 137 137 132* 137  K 3.7 4.0 4.4 4.0  CL 103 104 100* 98*  CO2 26 25 22 29   GLUCOSE 119* 90 109* 96  BUN 14 8 10 9   CREATININE 0.96 0.95 0.89 0.81  CALCIUM 8.9 9.0 8.9 9.5   GFR Estimated Creatinine Clearance: 150.7 mL/min (by C-G formula based on Cr of 0.81). Liver Function Tests: No results for input(s): AST, ALT, ALKPHOS, BILITOT, PROT, ALBUMIN in the last 168 hours. No results for input(s): LIPASE, AMYLASE in the last 168 hours. No results for input(s): AMMONIA in the last 168 hours. Coagulation profile No results for input(s): INR, PROTIME in the last 168 hours.  CBC:  Recent Labs Lab 02/08/16 1930 02/09/16 1821 02/10/16 1154 02/12/16 1150  WBC 11.4* 12.8* 15.1* 10.6*  NEUTROABS 8.3* 8.9*  --  7.5  HGB 14.9 15.0 15.6 14.4  HCT 43.6 44.9 46.5 43.1  MCV 92.2 92.0 91.0 90.5  PLT 287 289 232 320   Cardiac Enzymes: No results for input(s): CKTOTAL, CKMB, CKMBINDEX, TROPONINI in the last 168 hours. BNP: Invalid input(s): POCBNP CBG: No results for input(s): GLUCAP in the last 168 hours.  D-Dimer No results for input(s): DDIMER in the last 72 hours. Hgb A1c No results for input(s): HGBA1C in the last 72 hours. Lipid Profile No results for input(s): CHOL, HDL, LDLCALC, TRIG, CHOLHDL, LDLDIRECT in the last 72 hours. Thyroid function studies No results for input(s): TSH, T4TOTAL, T3FREE, THYROIDAB in the last 72 hours.  Invalid input(s): FREET3 Anemia work up No results for input(s): VITAMINB12, FOLATE, FERRITIN, TIBC, IRON, RETICCTPCT in the last 72 hours. Microbiology Recent Results (from the past 240 hour(s))  Blood culture (routine x 2)     Status: Abnormal   Collection Time: 02/08/16  7:30 PM    Result Value Ref Range Status   Specimen Description BLOOD LEFT UPPER ARM  Final   Special Requests   Final    BOTTLES DRAWN AEROBIC AND ANAEROBIC AER 10cc ANA 10cc   Culture  Setup Time   Final    GRAM POSITIVE COCCI IN CLUSTERS ANAEROBIC BOTTLE ONLY CRITICAL RESULT CALLED TO, READ BACK BY AND VERIFIED WITH: J LEDFORD,PHARMD AT 0300 02/10/16 BY Ivory Broad Performed at [facility]    Culture STAPHYLOCOCCUS AUREUS (A)  Final   Report Status 02/12/2016 FINAL  Final   Organism ID, Bacteria STAPHYLOCOCCUS AUREUS  Final      Susceptibility   Staphylococcus aureus - MIC*    CIPROFLOXACIN <=0.5 SENSITIVE Sensitive     ERYTHROMYCIN <=0.25 SENSITIVE Sensitive     GENTAMICIN <=0.5 SENSITIVE Sensitive     OXACILLIN 0.5 SENSITIVE Sensitive     TETRACYCLINE <=1 SENSITIVE Sensitive     VANCOMYCIN <=0.5 SENSITIVE Sensitive     TRIMETH/SULFA <=10 SENSITIVE Sensitive     CLINDAMYCIN <=0.25 SENSITIVE Sensitive     RIFAMPIN <=0.5 SENSITIVE Sensitive     Inducible Clindamycin NEGATIVE Sensitive     * STAPHYLOCOCCUS AUREUS  Blood Culture ID Panel (Reflexed)     Status: Abnormal   Collection Time: 02/08/16  7:30 PM  Result Value Ref Range Status   Enterococcus species NOT DETECTED NOT DETECTED Final   Vancomycin resistance NOT DETECTED NOT DETECTED Final   Listeria monocytogenes NOT DETECTED NOT DETECTED Final   Staphylococcus species DETECTED (A) NOT DETECTED Final    Comment: JAMES LEDFORD,PHARMD@0300  02/10/16 MKELLY   Staphylococcus aureus DETECTED (A) NOT DETECTED Final    Comment: JAMES LEDFORD,PHARMD@0300  02/10/16 MKELLY   Methicillin resistance NOT DETECTED NOT DETECTED Final   Streptococcus species NOT DETECTED NOT DETECTED Final   Streptococcus agalactiae NOT DETECTED NOT DETECTED Final   Streptococcus pneumoniae NOT DETECTED NOT DETECTED Final   Streptococcus pyogenes NOT DETECTED NOT DETECTED Final   Acinetobacter baumannii NOT DETECTED NOT DETECTED Final   Enterobacteriaceae  species NOT DETECTED NOT DETECTED Final   Enterobacter cloacae complex NOT DETECTED NOT DETECTED Final   Escherichia coli NOT DETECTED NOT DETECTED Final   Klebsiella oxytoca NOT DETECTED NOT DETECTED Final   Klebsiella pneumoniae NOT DETECTED NOT DETECTED Final   Proteus species NOT DETECTED NOT DETECTED Final   Serratia marcescens NOT DETECTED NOT DETECTED Final   Carbapenem resistance NOT DETECTED NOT DETECTED Final   Haemophilus influenzae NOT DETECTED NOT DETECTED Final   Neisseria meningitidis NOT DETECTED NOT DETECTED Final   Pseudomonas aeruginosa NOT DETECTED NOT DETECTED Final   Candida albicans NOT DETECTED NOT DETECTED Final   Candida glabrata NOT DETECTED NOT DETECTED Final   Candida krusei NOT DETECTED NOT DETECTED Final   Candida parapsilosis NOT DETECTED NOT DETECTED Final   Candida tropicalis NOT DETECTED NOT DETECTED Final  Blood culture (  routine x 2)     Status: None   Collection Time: 02/08/16  7:40 PM  Result Value Ref Range Status   Specimen Description BLOOD LEFT FOREARM  Final   Special Requests   Final    BOTTLES DRAWN AEROBIC AND ANAEROBIC AER 10cc ANA 10cc   Culture   Final    NO GROWTH 5 DAYS Performed at [facility]    Report Status 02/13/2016 FINAL  Final  Blood culture (routine x 2)     Status: Abnormal   Collection Time: 02/10/16 12:30 AM  Result Value Ref Range Status   Specimen Description BLOOD RIGHT HAND  Final   Special Requests AEROBIC BOTTLE ONLY  Final   Culture  Setup Time   Final    GRAM POSITIVE RODS CRITICAL RESULT CALLED TO, READ BACK BY AND VERIFIED WITH: N. Batchelder Pharm.D. 12:50 02/11/16 (wilsonm) AEROBIC BOTTLE ONLY    Culture (A)  Final    DIPHTHEROIDS(CORYNEBACTERIUM SPECIES) Standardized susceptibility testing for this organism is not available.    Report Status 02/13/2016 FINAL  Final  Blood Culture ID Panel (Reflexed)     Status: None   Collection Time: 02/10/16 12:30 AM  Result Value Ref Range Status    Enterococcus species NOT DETECTED NOT DETECTED Final   Vancomycin resistance NOT DETECTED NOT DETECTED Final   Listeria monocytogenes NOT DETECTED NOT DETECTED Final   Staphylococcus species NOT DETECTED NOT DETECTED Final   Staphylococcus aureus NOT DETECTED NOT DETECTED Final   Methicillin resistance NOT DETECTED NOT DETECTED Final   Streptococcus species NOT DETECTED NOT DETECTED Final   Streptococcus agalactiae NOT DETECTED NOT DETECTED Final   Streptococcus pneumoniae NOT DETECTED NOT DETECTED Final   Streptococcus pyogenes NOT DETECTED NOT DETECTED Final   Acinetobacter baumannii NOT DETECTED NOT DETECTED Final   Enterobacteriaceae species NOT DETECTED NOT DETECTED Final   Enterobacter cloacae complex NOT DETECTED NOT DETECTED Final   Escherichia coli NOT DETECTED NOT DETECTED Final   Klebsiella oxytoca NOT DETECTED NOT DETECTED Final   Klebsiella pneumoniae NOT DETECTED NOT DETECTED Final   Proteus species NOT DETECTED NOT DETECTED Final   Serratia marcescens NOT DETECTED NOT DETECTED Final   Carbapenem resistance NOT DETECTED NOT DETECTED Final   Haemophilus influenzae NOT DETECTED NOT DETECTED Final   Neisseria meningitidis NOT DETECTED NOT DETECTED Final   Pseudomonas aeruginosa NOT DETECTED NOT DETECTED Final   Candida albicans NOT DETECTED NOT DETECTED Final   Candida glabrata NOT DETECTED NOT DETECTED Final   Candida krusei NOT DETECTED NOT DETECTED Final   Candida parapsilosis NOT DETECTED NOT DETECTED Final   Candida tropicalis NOT DETECTED NOT DETECTED Final  Blood culture (routine x 2)     Status: None (Preliminary result)   Collection Time: 02/10/16 12:40 AM  Result Value Ref Range Status   Specimen Description BLOOD LEFT HAND  Final   Special Requests BOTTLES DRAWN AEROBIC AND ANAEROBIC  Final   Culture NO GROWTH 3 DAYS  Final   Report Status PENDING  Incomplete     Discharge Instructions:   Discharge Instructions    Diet general    Complete by:  As  directed      Discharge instructions    Complete by:  As directed   Follow up with pain clinic doctor in AM for pain medications     Increase activity slowly    Complete by:  As directed             Medication  List    TAKE these medications        acetaminophen 325 MG tablet  Commonly known as:  TYLENOL  Take 2 tablets (650 mg total) by mouth every 6 (six) hours as needed for mild pain (or Fever >/= 101).     albuterol 108 (90 Base) MCG/ACT inhaler  Commonly known as:  PROVENTIL HFA;VENTOLIN HFA  Inhale 2 puffs into the lungs every 6 (six) hours as needed for wheezing or shortness of breath.     ibuprofen 600 MG tablet  Commonly known as:  ADVIL,MOTRIN  Take 1 tablet (600 mg total) by mouth every 6 (six) hours as needed for fever or moderate pain.     linezolid 600 MG tablet  Commonly known as:  ZYVOX  Take 1 tablet (600 mg total) by mouth 2 (two) times daily.     Oxycodone HCl 10 MG Tabs  Take 10 mg by mouth 3 (three) times daily.           Follow-up Information    Follow up with Jacki ConesGIOFFRE,RONALD A, MD.   Specialty:  Orthopedic Surgery   Why:  show up to office on Monday between 8-10: he will be expecting you   Contact information:   [address] [place] [address] [phone]        Time coordinating discharge: 35 min  Signed:  Kashina Mecum Juanetta GoslingU Ziaire Bieser   [facility] 02/14/2016, 2:13 PM

## 2016-02-15 LAB — CULTURE, BLOOD (ROUTINE X 2): Culture: NO GROWTH

## 2016-03-05 ENCOUNTER — Emergency Department (HOSPITAL_BASED_OUTPATIENT_CLINIC_OR_DEPARTMENT_OTHER): Payer: Medicaid Other

## 2016-03-05 ENCOUNTER — Emergency Department (HOSPITAL_BASED_OUTPATIENT_CLINIC_OR_DEPARTMENT_OTHER)
Admission: EM | Admit: 2016-03-05 | Discharge: 2016-03-05 | Disposition: A | Discharge status: Home / Self Care | Payer: Medicaid Other | Attending: Emergency Medicine | Admitting: Emergency Medicine

## 2016-03-05 ENCOUNTER — Encounter (HOSPITAL_BASED_OUTPATIENT_CLINIC_OR_DEPARTMENT_OTHER): Payer: Self-pay | Admitting: Emergency Medicine

## 2016-03-05 DIAGNOSIS — J45909 Unspecified asthma, uncomplicated: Secondary | ICD-10-CM | POA: Diagnosis not present

## 2016-03-05 DIAGNOSIS — F17210 Nicotine dependence, cigarettes, uncomplicated: Secondary | ICD-10-CM | POA: Diagnosis not present

## 2016-03-05 DIAGNOSIS — M25572 Pain in left ankle and joints of left foot: Secondary | ICD-10-CM | POA: Diagnosis present

## 2016-03-05 LAB — CBC WITH DIFFERENTIAL/PLATELET
Basophils Absolute: 0 10*3/uL (ref 0.0–0.1)
Basophils Relative: 0 %
Eosinophils Absolute: 0.4 10*3/uL (ref 0.0–0.7)
Eosinophils Relative: 5 %
HCT: 39.1 % (ref 39.0–52.0)
Hemoglobin: 13.3 g/dL (ref 13.0–17.0)
Lymphocytes Relative: 19 %
Lymphs Abs: 1.6 10*3/uL (ref 0.7–4.0)
MCH: 31 pg (ref 26.0–34.0)
MCHC: 34 g/dL (ref 30.0–36.0)
MCV: 91.1 fL (ref 78.0–100.0)
Monocytes Absolute: 0.8 10*3/uL (ref 0.1–1.0)
Monocytes Relative: 9 %
Neutro Abs: 5.9 10*3/uL (ref 1.7–7.7)
Neutrophils Relative %: 67 %
Platelets: 291 10*3/uL (ref 150–400)
RBC: 4.29 MIL/uL (ref 4.22–5.81)
RDW: 12.9 % (ref 11.5–15.5)
WBC: 8.7 10*3/uL (ref 4.0–10.5)

## 2016-03-05 LAB — BASIC METABOLIC PANEL
Anion gap: 7 (ref 5–15)
BUN: 5 mg/dL — ABNORMAL LOW (ref 6–20)
CO2: 28 mmol/L (ref 22–32)
Calcium: 8.8 mg/dL — ABNORMAL LOW (ref 8.9–10.3)
Chloride: 103 mmol/L (ref 101–111)
Creatinine, Ser: 0.84 mg/dL (ref 0.61–1.24)
GFR calc Af Amer: >60 mL/min (ref >60)
GFR calc non Af Amer: >60 mL/min (ref >60)
Glucose, Bld: 106 mg/dL — ABNORMAL HIGH (ref 65–99)
Potassium: 4.1 mmol/L (ref 3.5–5.1)
Sodium: 138 mmol/L (ref 135–145)

## 2016-03-05 LAB — I-STAT CG4 LACTIC ACID, ED: LACTIC ACID, VENOUS: 1.19 mmol/L (ref 0.5–1.9)

## 2016-03-05 MED ORDER — LINEZOLID 600 MG PO TABS: 600.0000 mg | ORAL_TABLET | Freq: Two times a day (BID) | ORAL | Status: AC

## 2016-03-05 MED ORDER — MORPHINE SULFATE (PF) 4 MG/ML IV SOLN
4.0000 mg | Freq: Once | INTRAVENOUS | Status: AC
  Administered 2016-03-05: 4 mg via INTRAVENOUS
  Filled 2016-03-05: qty 1

## 2016-03-05 NOTE — ED Provider Notes (Signed)
CSN: [id number]     Arrival date & time 03/05/16  1017 History   First MD Initiated Contact with Patient 03/05/16 1027     Chief Complaint  Patient presents with  . Ankle Pain   HPI   35 year old male presents today with complaints of ankle pain. Patient has significant past medical history of calcaneal fracture with internal fixation in February 2016. Patient was [facility]/08/2016. Patient left AMA with IV intact, returned again with worsening pain. Patient had plain radiographs and MRI, was admitted for potential septic arthritis. Patient was seen by Dr. Lajoyce Cornersuda, patient did not want to be seen by him and was followed up by Dr. Darrelyn HillockGioffre. Patient was thought to have cellulitis as opposed to septic arthritis, had improvement while in the hospital and was discharged home on with nasal lid. Patient notes that since being discharged he continue to have pain, episodic swelling that has come and gone. Patient reports he finished his antibiotics 3 days ago, and had worsening swelling to the ankle. Patient denies any fever or chills at home, any other infectious signs or symptoms. He denies any current drug use.   Past Medical History  Diagnosis Date  . Asthma   . Anxiety   . Heroin addiction [facility](HCC)    Past Surgical History  Procedure Laterality Date  . Orif calcaneal fracture Left 10/05/2014  . Ankle surgery    . Wrist surgery Right 2015  . Tee without cardioversion N/A 02/13/2016    Procedure: TRANSESOPHAGEAL ECHOCARDIOGRAM (TEE);  Surgeon: Chilton Siiffany Dumas, MD;  [facility];  Service: Cardiovascular;  Laterality: N/A;   Family History  Problem Relation Age of Onset  . Hypertension Father   . Hypertension Mother    Social History  Substance Use Topics  . Smoking status: Current Every Day Smoker -- 0.50 packs/day for 18 years    Types: Cigarettes  . Smokeless tobacco: Never Used  . Alcohol Use: No    Review of Systems  All other systems reviewed  and are negative.     Allergies  Review of patient's allergies indicates no known allergies.  Home Medications   Prior to Admission medications   Medication Sig Start Date End Date Taking? Authorizing Provider  acetaminophen (TYLENOL) 325 MG tablet Take 2 tablets (650 mg total) by mouth every 6 (six) hours as needed for mild pain (or Fever >/= 101). 02/14/16   Joseph ArtJessica U Vann, DO  albuterol (PROVENTIL HFA;VENTOLIN HFA) 108 (90 Base) MCG/ACT inhaler Inhale 2 puffs into the lungs every 6 (six) hours as needed for wheezing or shortness of breath.    Historical Provider, MD  ibuprofen (ADVIL,MOTRIN) 600 MG tablet Take 1 tablet (600 mg total) by mouth every 6 (six) hours as needed for fever or moderate pain. 02/14/16   Joseph ArtJessica U Vann, DO  linezolid (ZYVOX) 600 MG tablet Take 1 tablet (600 mg total) by mouth 2 (two) times daily. 03/05/16   Eyvonne MechanicJeffrey Maritza Hosterman, PA-C  Oxycodone HCl 10 MG TABS Take 10 mg by mouth 3 (three) times daily.     Historical Provider, MD   BP 124/78 mmHg  Pulse 89  Temp(Src) 99 F (37.2 C) (Oral)  Resp 18  Ht 6\' 3"  (1.905 m)  Wt 79.861 kg  BMI 22.01 kg/m2  SpO2 97%   Physical Exam  Constitutional: He is oriented to person, place, and time. He appears well-developed and well-nourished.  HENT:  Head: Normocephalic and atraumatic.  Eyes: Conjunctivae are  normal. Pupils are equal, round, and reactive to light. Right eye exhibits no discharge. Left eye exhibits no discharge. No scleral icterus.  Neck: Normal range of motion. No JVD present. No tracheal deviation present.  Pulmonary/Chest: Effort normal. No stridor.  Musculoskeletal:  Obvious swelling, warmth to touch to the left ankle, more pronounced on the lateral aspect. No redness, skin breakdown, or any signs of trauma.  Neurological: He is alert and oriented to person, place, and time. Coordination normal.  Psychiatric: He has a normal mood and affect. His behavior is normal. Judgment and thought content normal.   Nursing note and vitals reviewed.   ED Course  Procedures (including critical care time) Labs Review Labs Reviewed  BASIC METABOLIC PANEL - Abnormal; Notable for the following:    Glucose, Bld 106 (*)    BUN 5 (*)    Calcium 8.8 (*)    All other components within normal limits  CULTURE, BLOOD (ROUTINE X 2)  CULTURE, BLOOD (ROUTINE X 2)  CBC WITH DIFFERENTIAL/PLATELET  I-STAT CG4 LACTIC ACID, ED    Imaging Review Dg Ankle Complete Left  03/05/2016  CLINICAL DATA:  Pain with swelling and redness for 2 weeks EXAM: LEFT ANKLE COMPLETE - 3+ VIEW COMPARISON:  Left ankle radiographs February 08, 2016; left ankle MRI February 09, 2016 FINDINGS: Frontal, oblique, and lateral views were obtained. There is extensive postoperative change throughout the calcaneus, stable in appearance. There is generalized soft tissue swelling. There is no acute fracture or joint effusion. The ankle mortise appears intact. The joint spaces appear unremarkable. IMPRESSION: Soft tissue swelling. No fracture or bony destruction. Extensive postoperative change throughout the calcaneus, stable. Electronically Signed   By: Bretta BangWilliam  Woodruff III M.D.   On: 03/05/2016 11:07   I have personally reviewed and evaluated these images and lab results as part of my medical decision-making.   EKG Interpretation None      MDM   Final diagnoses:  Ankle pain, left    Labs: BMP, CBC, i-STAT lactic acid, blood culture  Imaging: DG ankle complete  Consults: [facility]- Dr. Darrelyn HillockGioffre  Therapeutics: Morphine  Discharge Meds: zyvox   Assessment/Plan: [age] year old male presents with ankle pain. Patient has significant past medical history of internal fixation of his calcaneus with subsequent painful episodes and infectious etiology. Patient was most recently admitted to the hospital with bacteremia, and infection of the ankle. There is some question as to this was cellulitis, tenosynovitis, low suspicion for septic arthritis  at that time. Patient had improvement in symptoms on medications in the hospital and was discharged home. Patient reports worsening swelling, he is afebrile, has no white count here, no signs of systemic symptoms. I consult that his orthopedist who is aware of the patient's case, he instructed to have patient placed back on the left nasal lid, with close follow-up in his office. He reports that the patient will be seen tomorrow at 2 PM. Patient is aren't on a pain contract, pain being managed by his primary care. Patient will be discharged home with instructions to follow-up with orthopedist tomorrow at 2 PM, continue using antibiotics, monitor for any new or worsening signs or symptoms return immediately if any present. Patient verbalized understanding and agreement today's plan had no further questions or concerns at time of discharge.        Eyvonne MechanicJeffrey Joy Reiger, PA-C 03/05/16 1157  Lavera Guiseana Duo Liu, MD 03/05/16 1240  Lavera Guiseana Duo Liu, MD 03/05/16 [phone]

## 2016-03-05 NOTE — ED Notes (Signed)
PA  Aware that we could not get 2nd culture d/t poor access. States okay with just one for now.

## 2016-03-05 NOTE — Discharge Instructions (Signed)
Please follow-up with Dr. Darrelyn HillockGioffre tomorrow at 2:00PM  Ankle Pain Ankle pain is a common symptom. The bones, cartilage, tendons, and muscles of the ankle joint perform a lot of work each day. The ankle joint holds your body weight and allows you to move around. Ankle pain can occur on either side or back of 1 or both ankles. Ankle pain may be sharp and burning or dull and aching. There may be tenderness, stiffness, redness, or warmth around the ankle. The pain occurs more often when a person walks or puts pressure on the ankle. CAUSES  There are many reasons ankle pain can develop. It is important to work with your caregiver to identify the cause since many conditions can impact the bones, cartilage, muscles, and tendons. Causes for ankle pain include:  Injury, including a break (fracture), sprain, or strain often due to a fall, sports, or a high-impact activity.  Swelling (inflammation) of a tendon (tendonitis).  Achilles tendon rupture.  Ankle instability after repeated sprains and strains.  Poor foot alignment.  Pressure on a nerve (tarsal tunnel syndrome).  Arthritis in the ankle or the lining of the ankle.  Crystal formation in the ankle (gout or pseudogout). DIAGNOSIS  A diagnosis is based on your medical history, your symptoms, results of your physical exam, and results of diagnostic tests. Diagnostic tests may include X-ray exams or a computerized magnetic scan (magnetic resonance imaging, MRI). TREATMENT  Treatment will depend on the cause of your ankle pain and may include:  Keeping pressure off the ankle and limiting activities.  Using crutches or other walking support (a cane or brace).  Using rest, ice, compression, and elevation.  Participating in physical therapy or home exercises.  Wearing shoe inserts or special shoes.  Losing weight.  Taking medications to reduce pain or swelling or receiving an injection.  Undergoing surgery. HOME CARE INSTRUCTIONS   Only  take over-the-counter or prescription medicines for pain, discomfort, or fever as directed by your caregiver.  Put ice on the injured area.  Put ice in a plastic bag.  Place a towel between your skin and the bag.  Leave the ice on for 15-20 minutes at a time, 03-04 times a day.  Keep your leg raised (elevated) when possible to lessen swelling.  Avoid activities that cause ankle pain.  Follow specific exercises as directed by your caregiver.  Record how often you have ankle pain, the location of the pain, and what it feels like. This information may be helpful to you and your caregiver.  Ask your caregiver about returning to work or sports and whether you should drive.  Follow up with your caregiver for further examination, therapy, or testing as directed. SEEK MEDICAL CARE IF:   Pain or swelling continues or worsens beyond 1 week.  You have an oral temperature above 102 F (38.9 C).  You are feeling unwell or have chills.  You are having an increasingly difficult time with walking.  You have loss of sensation or other new symptoms.  You have questions or concerns. MAKE SURE YOU:   Understand these instructions.  Will watch your condition.  Will get help right away if you are not doing well or get worse.   This information is not intended to replace advice given to you by your health care provider. Make sure you discuss any questions you have with your health care provider.   Document Released: 02/05/2010 Document Revised: 11/10/2011 Document Reviewed: 03/20/2015 Elsevier Interactive Patient Education [employer]2016 Elsevier Inc.

## 2016-03-05 NOTE — ED Notes (Signed)
PA at bedside.

## 2016-03-05 NOTE — ED Notes (Signed)
Pt c/o L ankle pain and swelling, worsening over the last 2 days. Pt had ankle fracture requiring surgery last year. Redness and swelling noted.

## 2016-03-06 ENCOUNTER — Telehealth (HOSPITAL_BASED_OUTPATIENT_CLINIC_OR_DEPARTMENT_OTHER): Payer: Self-pay | Admitting: *Deleted

## 2016-03-06 NOTE — ED Notes (Signed)
Pt called regarding ED visit here yesterday. States he is unable to get his RX for Zyvox filled and "we helped him last time". Pt also confirms he has a follow up appointment with ortho (Dr Darrelyn HillockGioffre) today. This RN spoke with Durward Mallardamille from Care Management who advised pt to go to his f/u appointment and check to see if they could assist with obtaining the medication. If not then pt should [facility] today for assistance and they would fill it. This RN called patient back and attempted to give him the recommendations. Advised pt to go to his follow up appointment today and see if they could help. Pt stated "and if they can't then what". While attempting to explain to pt to [facility] pt stated "I can't be running around to all these places. I don't drive. [facility] and then hung up before all recommendations could be given to pt.

## 2016-03-10 LAB — CULTURE, BLOOD (ROUTINE X 2): Culture: NO GROWTH

## 2016-03-26 IMAGING — DX DG ANKLE COMPLETE 3+V*L*
3 series · 3 of 3 positions shown · non-contrast
Comparison: 03/12/2015

CLINICAL DATA: Left ankle pain after fall

EXAM:
LEFT ANKLE COMPLETE - 3+ VIEW

[ankle ap]
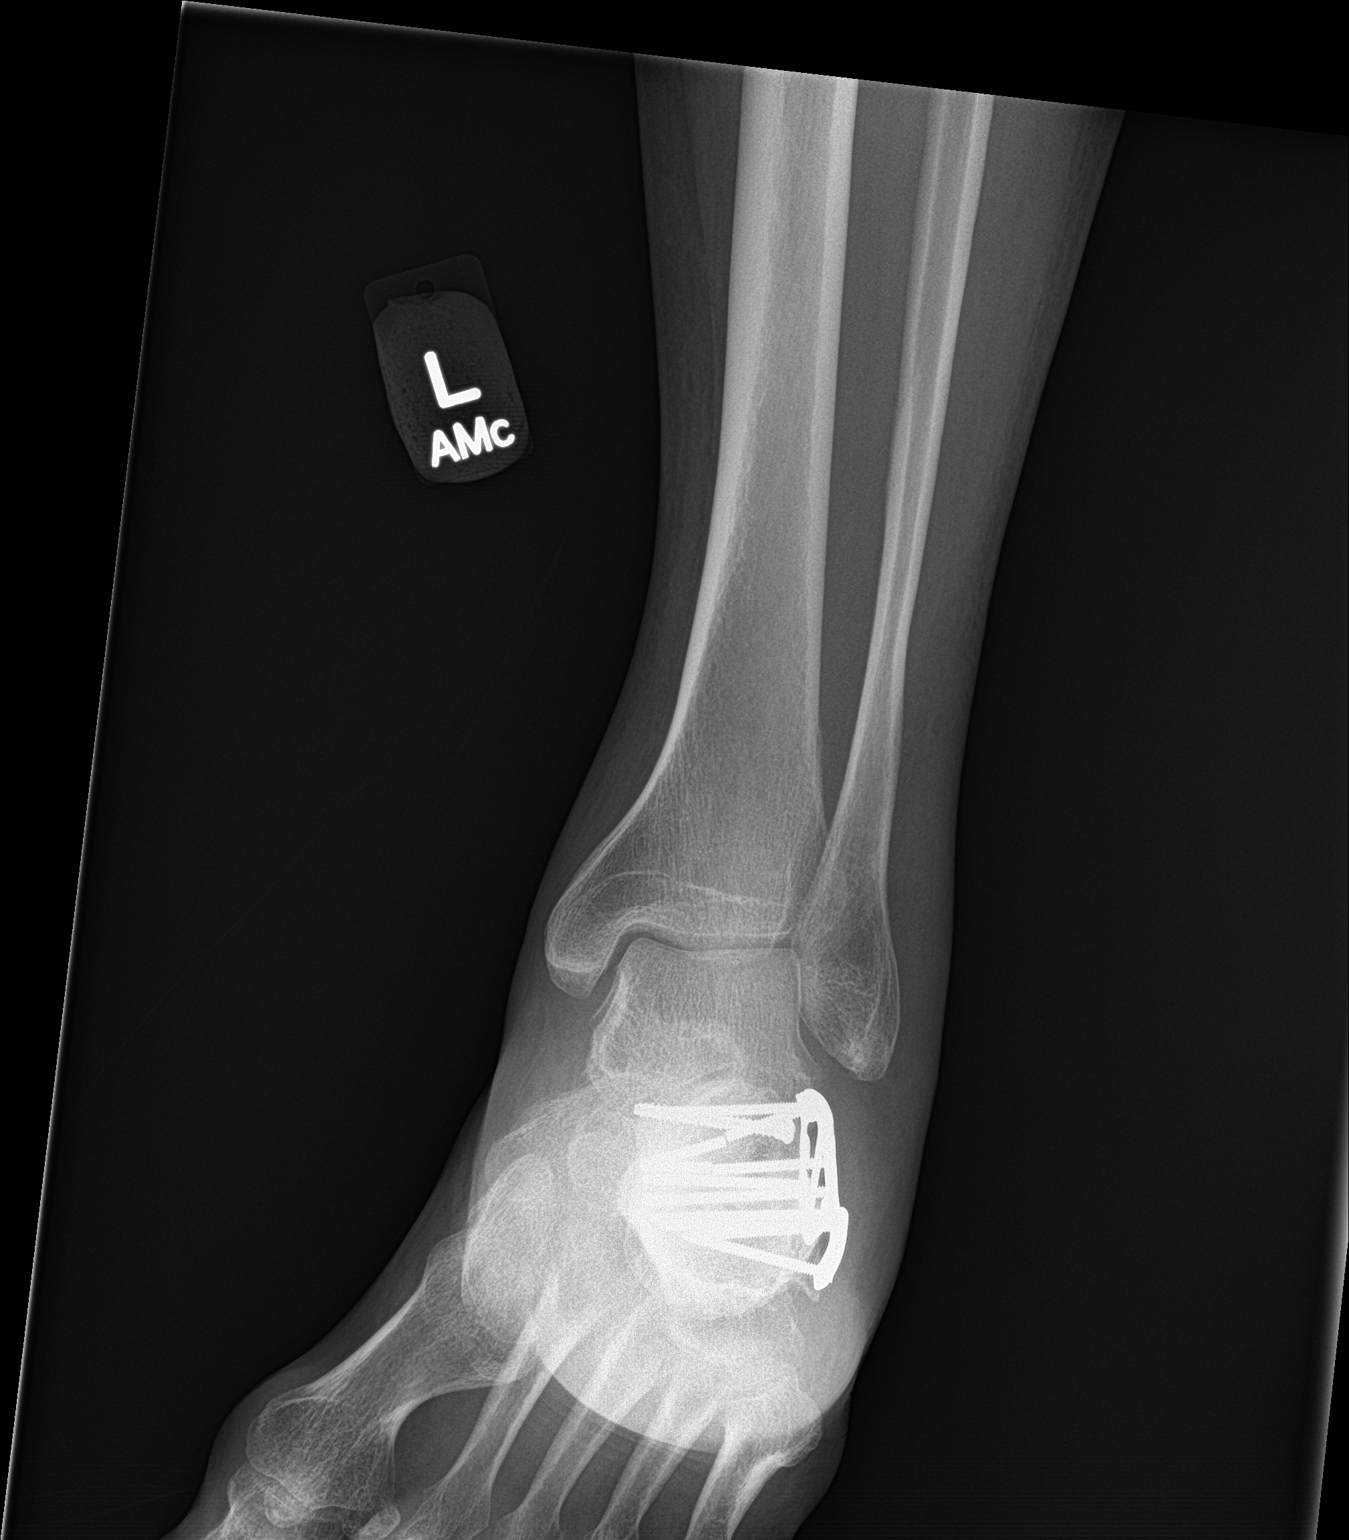

[ankle lat]
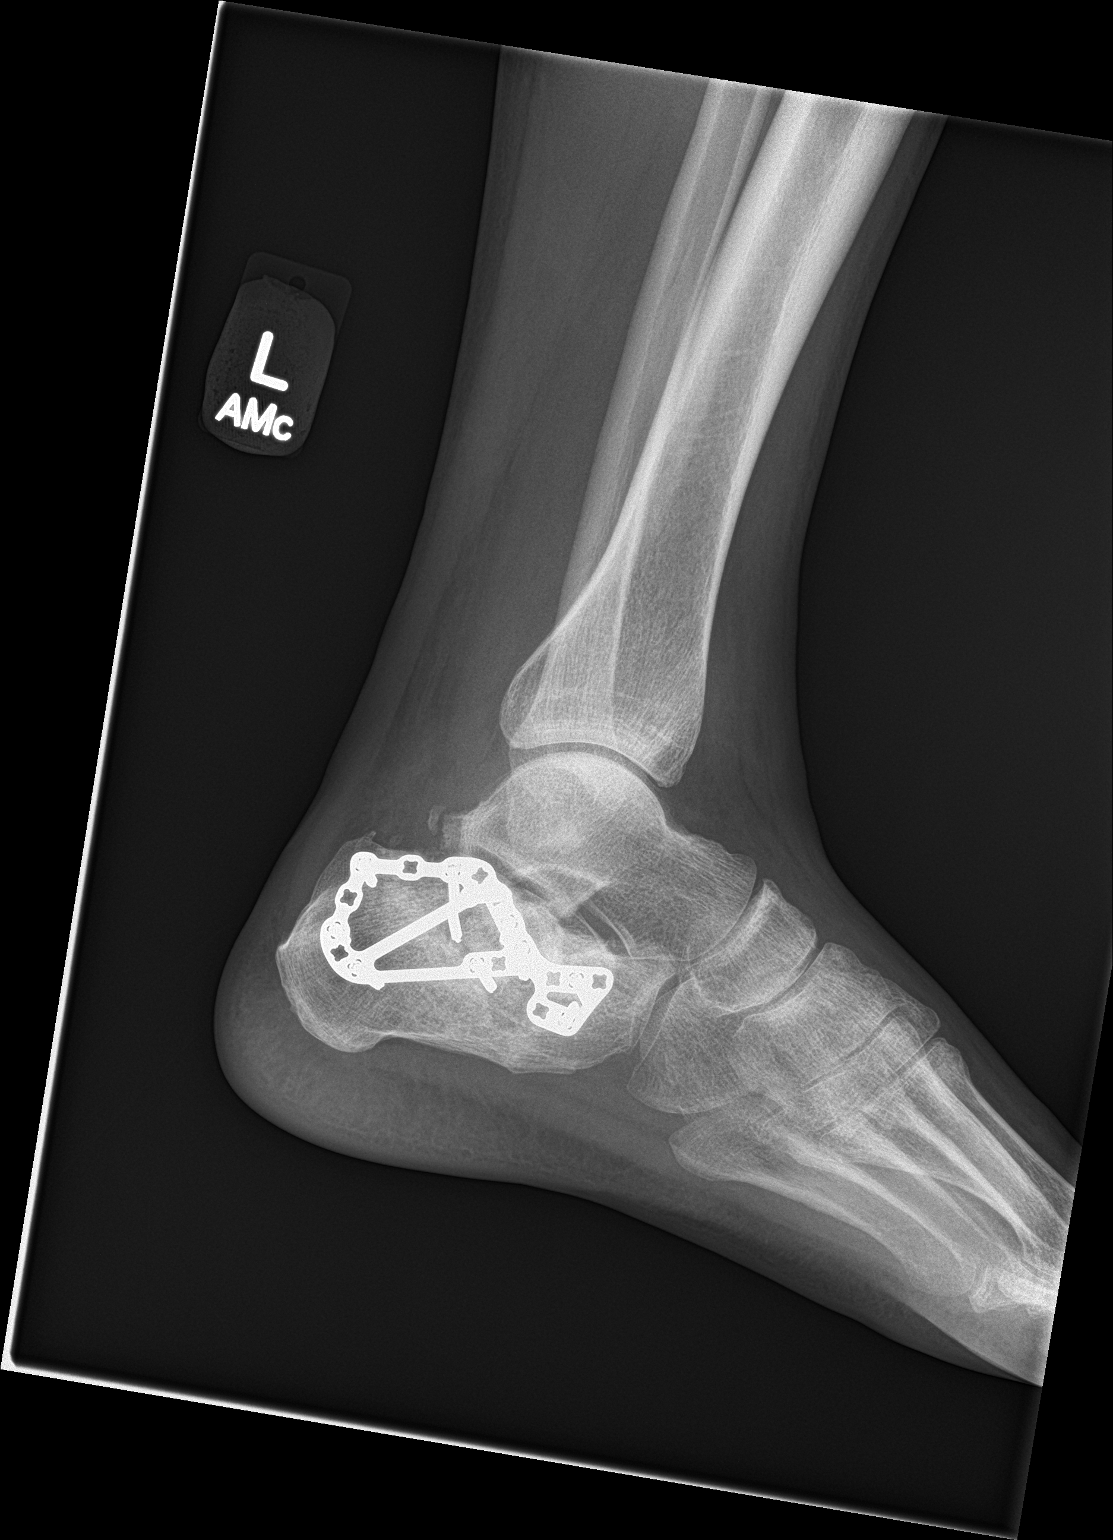

[ankle obl]
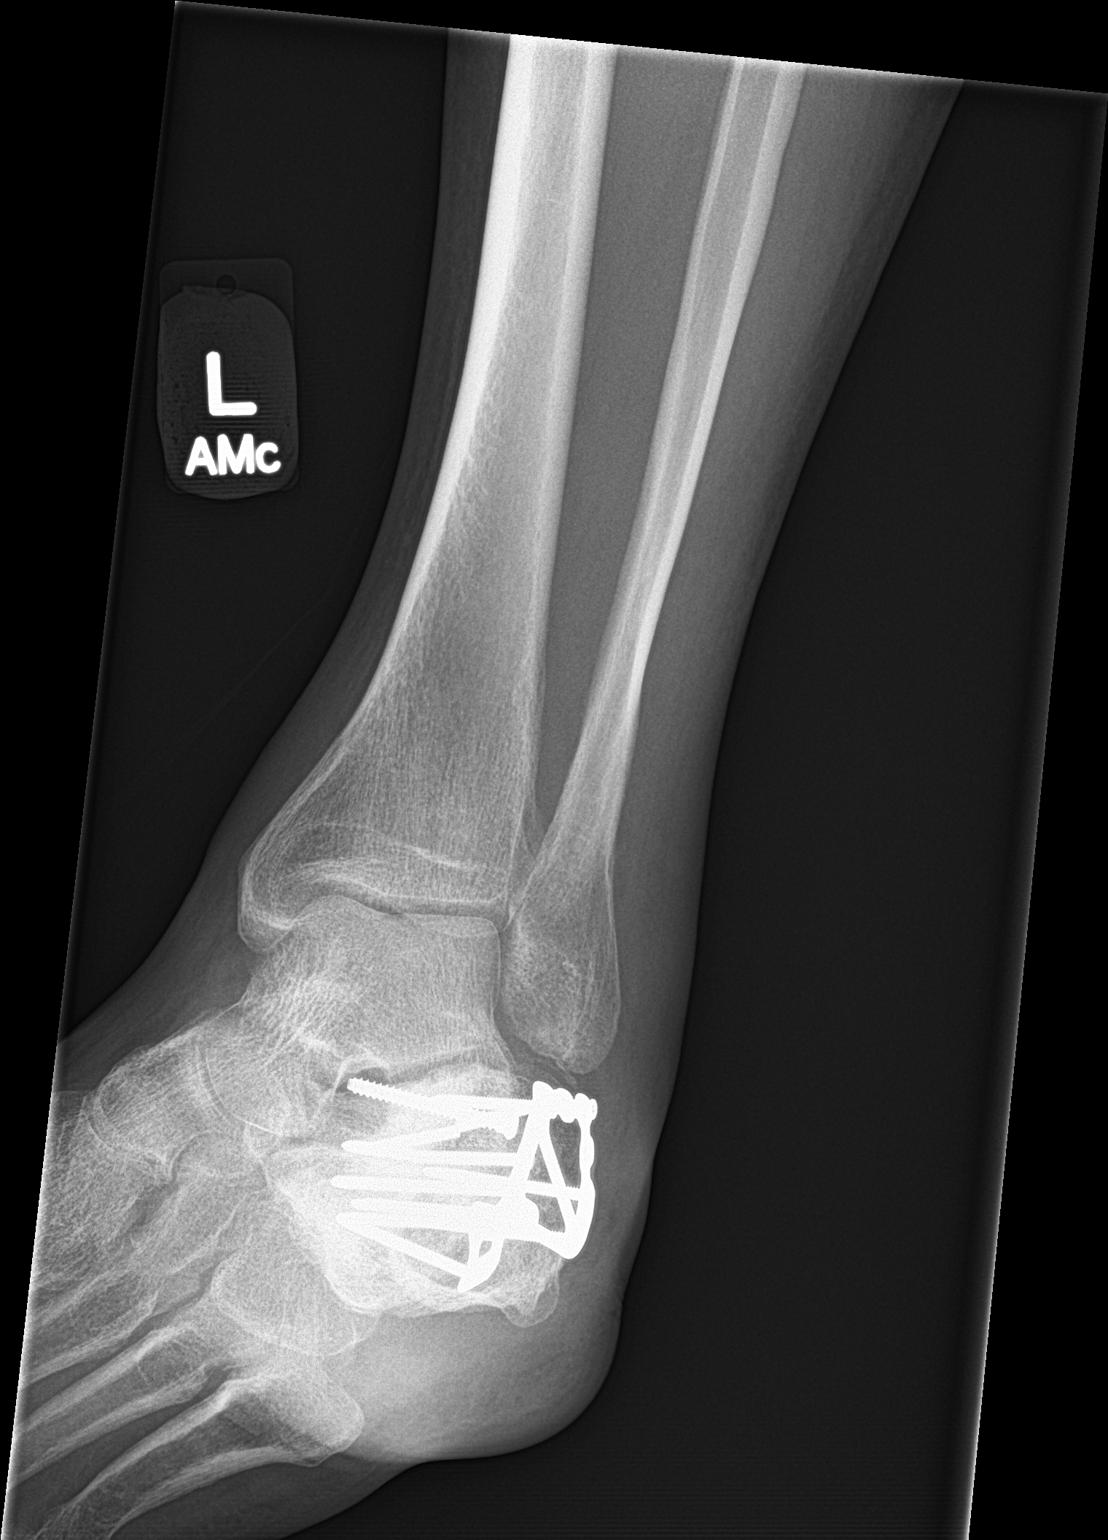

[3 of 3 positions shown; findings below may reference images not displayed]

FINDINGS: Negative for acute fracture, dislocation or radiopaque foreign body.
The mortise is symmetric. There is prior calcaneal fixation. There
are fractures of some of the fixation screws, unchanged.
IMPRESSION: Negative for acute fracture.

## 2016-07-30 ENCOUNTER — Emergency Department (HOSPITAL_COMMUNITY)
Admission: EM | Admit: 2016-07-30 | Discharge: 2016-07-30 | Disposition: A | Discharge status: Home / Self Care | Payer: Medicaid Other | Attending: Emergency Medicine | Admitting: Emergency Medicine

## 2016-07-30 ENCOUNTER — Emergency Department (HOSPITAL_COMMUNITY): Payer: Medicaid Other

## 2016-07-30 ENCOUNTER — Encounter (HOSPITAL_COMMUNITY): Payer: Self-pay | Admitting: Emergency Medicine

## 2016-07-30 DIAGNOSIS — J45909 Unspecified asthma, uncomplicated: Secondary | ICD-10-CM | POA: Diagnosis not present

## 2016-07-30 DIAGNOSIS — Z79899 Other long term (current) drug therapy: Secondary | ICD-10-CM | POA: Diagnosis not present

## 2016-07-30 DIAGNOSIS — Y939 Activity, unspecified: Secondary | ICD-10-CM | POA: Insufficient documentation

## 2016-07-30 DIAGNOSIS — Y92480 Sidewalk as the place of occurrence of the external cause: Secondary | ICD-10-CM | POA: Insufficient documentation

## 2016-07-30 DIAGNOSIS — X503XXA Overexertion from repetitive movements, initial encounter: Secondary | ICD-10-CM | POA: Diagnosis not present

## 2016-07-30 DIAGNOSIS — S99922A Unspecified injury of left foot, initial encounter: Secondary | ICD-10-CM | POA: Diagnosis present

## 2016-07-30 DIAGNOSIS — F17210 Nicotine dependence, cigarettes, uncomplicated: Secondary | ICD-10-CM | POA: Insufficient documentation

## 2016-07-30 DIAGNOSIS — Y999 Unspecified external cause status: Secondary | ICD-10-CM | POA: Insufficient documentation

## 2016-07-30 DIAGNOSIS — S93602A Unspecified sprain of left foot, initial encounter: Secondary | ICD-10-CM

## 2016-07-30 MED ORDER — KETOROLAC TROMETHAMINE 60 MG/2ML IM SOLN
60.0000 mg | Freq: Once | INTRAMUSCULAR | Status: AC
  Administered 2016-07-30: 60 mg via INTRAMUSCULAR
  Filled 2016-07-30: qty 2

## 2016-07-30 MED ORDER — LIDOCAINE 5 % EX PTCH
1.0000 | MEDICATED_PATCH | CUTANEOUS | Status: DC
  Administered 2016-07-30: 1 via TRANSDERMAL
  Filled 2016-07-30: qty 1

## 2016-07-30 MED ORDER — OXYCODONE-ACETAMINOPHEN 5-325 MG PO TABS
ORAL_TABLET | ORAL | Status: AC
  Filled 2016-07-30: qty 2

## 2016-07-30 MED ORDER — OXYCODONE-ACETAMINOPHEN 5-325 MG PO TABS
2.0000 | ORAL_TABLET | Freq: Once | ORAL | Status: AC
  Administered 2016-07-30: 2 via ORAL

## 2016-07-30 NOTE — ED Provider Notes (Signed)
[facility]-EMERGENCY DEPT Provider Note   CSN: [id number] Arrival date & time: 07/30/16  [id number]   By signing my name below, I, Nelwyn SalisburyJoshua Fowler, attest that this documentation has been prepared under the direction and in the presence of Tomasita CrumbleAdeleke Damiyah Ditmars, MD . Electronically Signed: Nelwyn SalisburyJoshua Fowler, Scribe. 07/30/2016. 3:56 AM.  History   Chief Complaint Chief Complaint  Patient presents with  . Ankle Pain   The history is provided by the patient. No language interpreter was used.    HPI Comments:  Kenneth Charles is a 35 y.o. male with pmhx of heroin addiction who presents to the Emergency Department complaining of sudden-onset constant left ankle pain s/p fall occurring earlier tonight. Pt states that he was stepping off the sidewalk when he twisted his ankle and fell. He notes that he has had several surgeries on the ankle. Pt reports associated left shoulder pain, secondary to the fall. He denies hitting his head or any LOC.   Past Medical History:  Diagnosis Date  . Anxiety   . Asthma   . Heroin addiction [facility](HCC)     Patient Active Problem List   Diagnosis Date Noted  . Cellulitis of left lower extremity   . Opioid dependence (HCC) 02/11/2016  . Staphylococcus aureus bacteremia 02/11/2016  . Ankle swelling   . Septic arthritis (HCC) 02/10/2016  . Cocaine abuse 02/10/2016  . Tobacco use disorder 02/10/2016  . Avulsion fracture of bone 11/20/2014  . 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 Unable to ambulate   . Chest tightness or pressure   . Fall at home 11/19/2014  . Left arm weakness 10/31/2014  . Atypical chest pain 10/31/2014  . Calcaneal fracture 10/06/2014  . Polydrug dependence including opioid type drug, episodic abuse (HCC) 10/06/2014  . Current tobacco use 02/05/2011    Past Surgical History:  Procedure Laterality Date  . ANKLE SURGERY    . ORIF CALCANEAL FRACTURE Left 10/05/2014  . TEE WITHOUT CARDIOVERSION N/A 02/13/2016   Procedure: TRANSESOPHAGEAL  ECHOCARDIOGRAM (TEE);  Surgeon: Chilton Siiffany Kersey, MD;  [facility];  Service: Cardiovascular;  Laterality: N/A;  . WRIST SURGERY Right 2015       Home Medications    Prior to Admission medications   Medication Sig Start Date End Date Taking? Authorizing Provider  acetaminophen (TYLENOL) 325 MG tablet Take 2 tablets (650 mg total) by mouth every 6 (six) hours as needed for mild pain (or Fever >/= 101). 02/14/16   Joseph ArtJessica U Vann, DO  albuterol (PROVENTIL HFA;VENTOLIN HFA) 108 (90 Base) MCG/ACT inhaler Inhale 2 puffs into the lungs every 6 (six) hours as needed for wheezing or shortness of breath.    Historical Provider, MD  ibuprofen (ADVIL,MOTRIN) 600 MG tablet Take 1 tablet (600 mg total) by mouth every 6 (six) hours as needed for fever or moderate pain. 02/14/16   Joseph ArtJessica U Vann, DO  linezolid (ZYVOX) 600 MG tablet Take 1 tablet (600 mg total) by mouth 2 (two) times daily. 03/05/16   Eyvonne MechanicJeffrey Hedges, PA-C  Oxycodone HCl 10 MG TABS Take 10 mg by mouth 3 (three) times daily.     Historical Provider, MD    Family History Family History  Problem Relation Age of Onset  . Hypertension Father   . Hypertension Mother     Social History Social History  Substance Use Topics  . Smoking status: Current Every Day Smoker    Packs/day: 0.50    Years: 18.00    Types: Cigarettes  . Smokeless tobacco: Never Used  .  Alcohol use No     Allergies   Patient has no known allergies.   Review of Systems Review of Systems 10 Systems reviewed and are negative for acute change except as noted in the HPI.  Physical Exam Updated Vital Signs BP 134/76 (BP Location: Right Arm)   Pulse 83   Temp 98 F (36.7 C)   Resp 15   SpO2 94%   Physical Exam  Constitutional: He is oriented to person, place, and time. Vital signs are normal. He appears well-developed and well-nourished.  Non-toxic appearance. He does not appear ill. No distress.  HENT:  Head: Normocephalic and atraumatic.  Nose:  Nose normal.  Mouth/Throat: Oropharynx is clear and moist. No oropharyngeal exudate.  Eyes: Conjunctivae and EOM are normal. Pupils are equal, round, and reactive to light. No scleral icterus.  Neck: Normal range of motion. Neck supple. No tracheal deviation, no edema, no erythema and normal range of motion present. No thyroid mass and no thyromegaly present.  Cardiovascular: Normal rate, regular rhythm, S1 normal, S2 normal, normal heart sounds, intact distal pulses and normal pulses.  Exam reveals no gallop and no friction rub.   No murmur heard. Pulmonary/Chest: Effort normal and breath sounds normal. No respiratory distress. He has no wheezes. He has no rhonchi. He has no rales.  Abdominal: Soft. Normal appearance and bowel sounds are normal. He exhibits no distension, no ascites and no mass. There is no hepatosplenomegaly. There is no tenderness. There is no rebound, no guarding and no CVA tenderness.  Musculoskeletal: Normal range of motion. He exhibits edema and tenderness.  Left foot and ankle diffusely swollen. No warmth. No cellulitis. No obvious deformity. Normal pulses and sensation in left foot.   Left shoulder: No warmth. No erythema. No obvious deformities.   Lymphadenopathy:    He has no cervical adenopathy.  Neurological: He is alert and oriented to person, place, and time. He has normal strength. No cranial nerve deficit or sensory deficit.  Skin: Skin is warm, dry and intact. No petechiae and no rash noted. He is not diaphoretic. No erythema. No pallor.  Nursing note and vitals reviewed.    ED Treatments / Results  DIAGNOSTIC STUDIES:  Oxygen Saturation is 94% on RA, low by my interpretation.    COORDINATION OF CARE:  4:00 AM Discussed treatment plan with pt at bedside which includes imaging and pt agreed to plan.  Labs (all labs ordered are listed, but only abnormal results are displayed) Labs Reviewed - No data to display  EKG  EKG Interpretation None        Radiology Dg Ankle Complete Left  Result Date: 07/30/2016 CLINICAL DATA:  Twisting injury to left ankle, with swelling and bruising. Unable to bear weight. Initial encounter. EXAM: LEFT ANKLE COMPLETE - 3+ VIEW COMPARISON:  Left ankle radiographs performed 03/05/2016 FINDINGS: There is no evidence of fracture or dislocation. Calcaneal hardware is grossly unremarkable in appearance, with surrounding degenerative change at the subtalar joint. An os trigonum is noted. The ankle mortise is intact; the interosseous space is within normal limits. No talar tilt or subluxation is seen. Prominent edema is noted at Kager's fat pad, with an ankle joint effusion. Soft tissue swelling is noted about the ankle. IMPRESSION: 1. No evidence of fracture or dislocation. 2. Relatively stable appearance to calcaneal hardware, with surrounding degenerative change at the subtalar joint. 3. Edema at Kager's fat pad, with an ankle joint effusion, more prominent than on the prior study. 4. Os trigonum noted.  Electronically Signed   By: Roanna Raider M.D.   On: 07/30/2016 03:32    Procedures Procedures (including critical care time)  Medications Ordered in ED Medications - No data to display   Initial Impression / Assessment and Plan / ED Course  I have reviewed the triage vital signs and the nursing notes.  Pertinent labs & imaging results that were available during my care of the patient were reviewed by me and considered in my medical decision making (see chart for details).  Clinical Course     Patient presents to the ED for ankle pain after slipping on a curb.  He ankle is diffusely swollen, this appears to be his baseline as this is noted in previous notes and XR results.  XR is negative for any acute injury.  He was given toradol, oxycodone and lidocaine patch for his pain.  Will reevaluate.  5:31 AM Upon repeat evaluation, patient found sleeping in the room and in NAD.  He has a chronic pain doctor and  has pain medication at home.  No Rx was provided.  He appears well and in NAD. Vs remain within his normal limits and he is safe for Dc.     Final Clinical Impressions(s) / ED Diagnoses   Final diagnoses:  None    New Prescriptions New Prescriptions   No medications on file      I personally performed the services described in this documentation, which was scribed in my presence. The recorded information has been reviewed and is accurate.       Tomasita Crumble, MD 07/30/16 [phone]

## 2016-07-30 NOTE — ED Triage Notes (Addendum)
Pt stepped off sidewalk in an abnormal way, injuring left ankle; pt has had 6-7 surgeries on this ankle; swelling noted; 200 mcg fentanyl given by EMS

## 2016-07-30 NOTE — ED Notes (Signed)
Pt stated that there is no way he is able to afford a cab to get home.  Pt stated that he always gets a "cab voucher" every time he is discharged.  Was advised that social worker will be consulted, and that he needs to wait until social worker  Is available on morning shift.

## 2016-07-30 NOTE — Progress Notes (Signed)
CSW spoke with patient at bedside with no family present. Patient reports he has no one that can transport him home for discharge. Patient reports he lives with a friend at the time at [address], [place], [place]. Patient reports he is not sure if this is the correct address. Patient reports he has no money to pay and no one that can pick him up.   Staffed with Industrial/product designerecretary and Nurse. Staffed with Asst. Director of Social Work who states to call Medicaid transportation to see if patient could be picked up this morning. CSW was on hold for fifteen minutes for Medicaid transportation and no one picked up. CSW informed Asst. Director of Social Work that patients cab voucher would cost thirty-three dollars. Voucher approved and given to Nurse.   Kenneth Charles, LCSWA Clincial Social Worker [phone]  9:10 AM
